# Patient Record
Sex: Male | Born: 1965 | Race: Black or African American | Hispanic: No | Marital: Single | Smoking: Never smoker
Health system: Southern US, Community
[De-identification: ages and names within clinical notes are randomized; demographics above are authoritative.]

## PROBLEM LIST (undated history)

## (undated) DIAGNOSIS — I10 Essential (primary) hypertension: Secondary | ICD-10-CM

## (undated) DIAGNOSIS — I509 Heart failure, unspecified: Secondary | ICD-10-CM

## (undated) DIAGNOSIS — E119 Type 2 diabetes mellitus without complications: Secondary | ICD-10-CM

## (undated) HISTORY — PX: NO PAST SURGERIES: SHX2092

---

## 2005-11-29 ENCOUNTER — Emergency Department: Payer: Self-pay | Admitting: Emergency Medicine

## 2005-12-02 ENCOUNTER — Emergency Department: Payer: Self-pay | Admitting: Emergency Medicine

## 2005-12-04 ENCOUNTER — Ambulatory Visit: Payer: Self-pay | Admitting: Emergency Medicine

## 2015-10-15 ENCOUNTER — Other Ambulatory Visit: Payer: Self-pay | Admitting: Internal Medicine

## 2015-10-15 DIAGNOSIS — I4891 Unspecified atrial fibrillation: Secondary | ICD-10-CM

## 2015-10-24 ENCOUNTER — Ambulatory Visit: Admission: RE | Admit: 2015-10-24 | Payer: Self-pay | Source: Ambulatory Visit

## 2016-09-07 ENCOUNTER — Inpatient Hospital Stay (HOSPITAL_COMMUNITY): Payer: BLUE CROSS/BLUE SHIELD

## 2016-09-07 ENCOUNTER — Inpatient Hospital Stay: Payer: BLUE CROSS/BLUE SHIELD

## 2016-09-07 ENCOUNTER — Inpatient Hospital Stay
Admission: EM | Admit: 2016-09-07 | Discharge: 2016-09-27 | DRG: 308 | Disposition: E | Payer: BLUE CROSS/BLUE SHIELD | Attending: Internal Medicine | Admitting: Internal Medicine

## 2016-09-07 ENCOUNTER — Encounter: Payer: Self-pay | Admitting: Intensive Care

## 2016-09-07 ENCOUNTER — Emergency Department: Payer: BLUE CROSS/BLUE SHIELD

## 2016-09-07 DIAGNOSIS — R402312 Coma scale, best motor response, none, at arrival to emergency department: Secondary | ICD-10-CM | POA: Diagnosis present

## 2016-09-07 DIAGNOSIS — I462 Cardiac arrest due to underlying cardiac condition: Secondary | ICD-10-CM | POA: Diagnosis present

## 2016-09-07 DIAGNOSIS — I469 Cardiac arrest, cause unspecified: Secondary | ICD-10-CM

## 2016-09-07 DIAGNOSIS — R402112 Coma scale, eyes open, never, at arrival to emergency department: Secondary | ICD-10-CM | POA: Diagnosis present

## 2016-09-07 DIAGNOSIS — I6782 Cerebral ischemia: Secondary | ICD-10-CM

## 2016-09-07 DIAGNOSIS — I472 Ventricular tachycardia: Secondary | ICD-10-CM | POA: Diagnosis not present

## 2016-09-07 DIAGNOSIS — G4730 Sleep apnea, unspecified: Secondary | ICD-10-CM | POA: Diagnosis present

## 2016-09-07 DIAGNOSIS — Z515 Encounter for palliative care: Secondary | ICD-10-CM

## 2016-09-07 DIAGNOSIS — J80 Acute respiratory distress syndrome: Secondary | ICD-10-CM | POA: Diagnosis present

## 2016-09-07 DIAGNOSIS — G931 Anoxic brain damage, not elsewhere classified: Secondary | ICD-10-CM

## 2016-09-07 DIAGNOSIS — E874 Mixed disorder of acid-base balance: Secondary | ICD-10-CM | POA: Diagnosis not present

## 2016-09-07 DIAGNOSIS — I4901 Ventricular fibrillation: Secondary | ICD-10-CM | POA: Diagnosis present

## 2016-09-07 DIAGNOSIS — I5023 Acute on chronic systolic (congestive) heart failure: Secondary | ICD-10-CM | POA: Diagnosis present

## 2016-09-07 DIAGNOSIS — Z9911 Dependence on respirator [ventilator] status: Secondary | ICD-10-CM | POA: Diagnosis not present

## 2016-09-07 DIAGNOSIS — I5084 End stage heart failure: Secondary | ICD-10-CM | POA: Diagnosis not present

## 2016-09-07 DIAGNOSIS — I482 Chronic atrial fibrillation: Secondary | ICD-10-CM | POA: Diagnosis present

## 2016-09-07 DIAGNOSIS — J984 Other disorders of lung: Secondary | ICD-10-CM

## 2016-09-07 DIAGNOSIS — G253 Myoclonus: Secondary | ICD-10-CM | POA: Diagnosis not present

## 2016-09-07 DIAGNOSIS — N179 Acute kidney failure, unspecified: Secondary | ICD-10-CM | POA: Diagnosis present

## 2016-09-07 DIAGNOSIS — R402212 Coma scale, best verbal response, none, at arrival to emergency department: Secondary | ICD-10-CM | POA: Diagnosis present

## 2016-09-07 DIAGNOSIS — R0600 Dyspnea, unspecified: Secondary | ICD-10-CM

## 2016-09-07 DIAGNOSIS — Z8249 Family history of ischemic heart disease and other diseases of the circulatory system: Secondary | ICD-10-CM

## 2016-09-07 DIAGNOSIS — D638 Anemia in other chronic diseases classified elsewhere: Secondary | ICD-10-CM | POA: Diagnosis not present

## 2016-09-07 DIAGNOSIS — J811 Chronic pulmonary edema: Secondary | ICD-10-CM | POA: Diagnosis not present

## 2016-09-07 DIAGNOSIS — J9611 Chronic respiratory failure with hypoxia: Secondary | ICD-10-CM

## 2016-09-07 DIAGNOSIS — Z66 Do not resuscitate: Secondary | ICD-10-CM | POA: Diagnosis not present

## 2016-09-07 DIAGNOSIS — J9600 Acute respiratory failure, unspecified whether with hypoxia or hypercapnia: Secondary | ICD-10-CM

## 2016-09-07 DIAGNOSIS — Z7984 Long term (current) use of oral hypoglycemic drugs: Secondary | ICD-10-CM

## 2016-09-07 DIAGNOSIS — D696 Thrombocytopenia, unspecified: Secondary | ICD-10-CM | POA: Diagnosis not present

## 2016-09-07 DIAGNOSIS — I130 Hypertensive heart and chronic kidney disease with heart failure and stage 1 through stage 4 chronic kidney disease, or unspecified chronic kidney disease: Secondary | ICD-10-CM | POA: Diagnosis present

## 2016-09-07 DIAGNOSIS — Z9181 History of falling: Secondary | ICD-10-CM

## 2016-09-07 DIAGNOSIS — J9601 Acute respiratory failure with hypoxia: Secondary | ICD-10-CM | POA: Diagnosis not present

## 2016-09-07 DIAGNOSIS — I429 Cardiomyopathy, unspecified: Secondary | ICD-10-CM | POA: Diagnosis present

## 2016-09-07 DIAGNOSIS — N183 Chronic kidney disease, stage 3 (moderate): Secondary | ICD-10-CM | POA: Diagnosis present

## 2016-09-07 DIAGNOSIS — G936 Cerebral edema: Secondary | ICD-10-CM | POA: Diagnosis present

## 2016-09-07 DIAGNOSIS — E782 Mixed hyperlipidemia: Secondary | ICD-10-CM | POA: Diagnosis present

## 2016-09-07 DIAGNOSIS — Z833 Family history of diabetes mellitus: Secondary | ICD-10-CM | POA: Diagnosis not present

## 2016-09-07 DIAGNOSIS — J9690 Respiratory failure, unspecified, unspecified whether with hypoxia or hypercapnia: Secondary | ICD-10-CM

## 2016-09-07 DIAGNOSIS — I248 Other forms of acute ischemic heart disease: Secondary | ICD-10-CM | POA: Diagnosis present

## 2016-09-07 DIAGNOSIS — Z6841 Body Mass Index (BMI) 40.0 and over, adult: Secondary | ICD-10-CM

## 2016-09-07 DIAGNOSIS — R570 Cardiogenic shock: Secondary | ICD-10-CM | POA: Diagnosis not present

## 2016-09-07 DIAGNOSIS — E1122 Type 2 diabetes mellitus with diabetic chronic kidney disease: Secondary | ICD-10-CM | POA: Diagnosis present

## 2016-09-07 DIAGNOSIS — R062 Wheezing: Secondary | ICD-10-CM

## 2016-09-07 DIAGNOSIS — R001 Bradycardia, unspecified: Secondary | ICD-10-CM | POA: Diagnosis not present

## 2016-09-07 DIAGNOSIS — Z452 Encounter for adjustment and management of vascular access device: Secondary | ICD-10-CM

## 2016-09-07 DIAGNOSIS — Z7901 Long term (current) use of anticoagulants: Secondary | ICD-10-CM

## 2016-09-07 DIAGNOSIS — K567 Ileus, unspecified: Secondary | ICD-10-CM | POA: Diagnosis not present

## 2016-09-07 DIAGNOSIS — R34 Anuria and oliguria: Secondary | ICD-10-CM | POA: Diagnosis present

## 2016-09-07 DIAGNOSIS — L8993 Pressure ulcer of unspecified site, stage 3: Secondary | ICD-10-CM | POA: Diagnosis present

## 2016-09-07 DIAGNOSIS — L8990 Pressure ulcer of unspecified site, unspecified stage: Secondary | ICD-10-CM | POA: Insufficient documentation

## 2016-09-07 DIAGNOSIS — J810 Acute pulmonary edema: Secondary | ICD-10-CM | POA: Diagnosis not present

## 2016-09-07 HISTORY — DX: Type 2 diabetes mellitus without complications: E11.9

## 2016-09-07 HISTORY — DX: Essential (primary) hypertension: I10

## 2016-09-07 HISTORY — DX: Heart failure, unspecified: I50.9

## 2016-09-07 LAB — BASIC METABOLIC PANEL
ANION GAP: 7 (ref 5–15)
Anion gap: 9 (ref 5–15)
Anion gap: 7 (ref 5–15)
Anion gap: 9 (ref 5–15)
Anion gap: 9 (ref 5–15)
BUN: 23 mg/dL — AB (ref 6–20)
BUN: 29 mg/dL — ABNORMAL HIGH (ref 6–20)
BUN: 31 mg/dL — AB (ref 6–20)
BUN: 32 mg/dL — AB (ref 6–20)
BUN: 33 mg/dL — AB (ref 6–20)
CALCIUM: 9.2 mg/dL (ref 8.9–10.3)
CALCIUM: 8.3 mg/dL — AB (ref 8.9–10.3)
CALCIUM: 8.4 mg/dL — AB (ref 8.9–10.3)
CALCIUM: 8.4 mg/dL — AB (ref 8.9–10.3)
CHLORIDE: 100 mmol/L — AB (ref 101–111)
CO2: 28 mmol/L (ref 22–32)
CO2: 29 mmol/L (ref 22–32)
CO2: 28 mmol/L (ref 22–32)
CO2: 28 mmol/L (ref 22–32)
CO2: 27 mmol/L (ref 22–32)
CREATININE: 2.12 mg/dL — AB (ref 0.61–1.24)
Calcium: 7.8 mg/dL — ABNORMAL LOW (ref 8.9–10.3)
Chloride: 100 mmol/L — ABNORMAL LOW (ref 101–111)
Chloride: 102 mmol/L (ref 101–111)
Chloride: 101 mmol/L (ref 101–111)
Chloride: 105 mmol/L (ref 101–111)
Creatinine, Ser: 1.68 mg/dL — ABNORMAL HIGH (ref 0.61–1.24)
Creatinine, Ser: 1.96 mg/dL — ABNORMAL HIGH (ref 0.61–1.24)
Creatinine, Ser: 2.07 mg/dL — ABNORMAL HIGH (ref 0.61–1.24)
Creatinine, Ser: 2.16 mg/dL — ABNORMAL HIGH (ref 0.61–1.24)
GFR calc Af Amer: 54 mL/min — ABNORMAL LOW (ref >60)
GFR calc Af Amer: 41 mL/min — ABNORMAL LOW (ref >60)
GFR calc Af Amer: 40 mL/min — ABNORMAL LOW (ref >60)
GFR calc non Af Amer: 39 mL/min — ABNORMAL LOW (ref >60)
GFR calc non Af Amer: 36 mL/min — ABNORMAL LOW (ref >60)
GFR calc non Af Amer: 35 mL/min — ABNORMAL LOW (ref >60)
GFR, EST AFRICAN AMERICAN: 45 mL/min — AB (ref >60)
GFR, EST AFRICAN AMERICAN: 42 mL/min — AB (ref >60)
GFR, EST NON AFRICAN AMERICAN: 47 mL/min — AB (ref >60)
GFR, EST NON AFRICAN AMERICAN: 34 mL/min — AB (ref >60)
GLUCOSE: 276 mg/dL — AB (ref 65–99)
GLUCOSE: 275 mg/dL — AB (ref 65–99)
GLUCOSE: 246 mg/dL — AB (ref 65–99)
GLUCOSE: 234 mg/dL — AB (ref 65–99)
Glucose, Bld: 158 mg/dL — ABNORMAL HIGH (ref 65–99)
POTASSIUM: 3.6 mmol/L (ref 3.5–5.1)
Potassium: 3.7 mmol/L (ref 3.5–5.1)
Potassium: 4 mmol/L (ref 3.5–5.1)
Potassium: 3.9 mmol/L (ref 3.5–5.1)
Potassium: 4 mmol/L (ref 3.5–5.1)
SODIUM: 139 mmol/L (ref 135–145)
Sodium: 137 mmol/L (ref 135–145)
Sodium: 138 mmol/L (ref 135–145)
Sodium: 138 mmol/L (ref 135–145)
Sodium: 137 mmol/L (ref 135–145)

## 2016-09-07 LAB — BLOOD GAS, ARTERIAL
ACID-BASE EXCESS: 1.2 mmol/L (ref 0.0–2.0)
Acid-Base Excess: 1.5 mmol/L (ref 0.0–2.0)
Bicarbonate: 28.7 mmol/L — ABNORMAL HIGH (ref 20.0–28.0)
Bicarbonate: 29.2 mmol/L — ABNORMAL HIGH (ref 20.0–28.0)
FIO2: 1
FIO2: 1
LHR: 16 {breaths}/min
O2 SAT: 88.5 %
O2 SAT: 96.9 %
PATIENT TEMPERATURE: 37
PATIENT TEMPERATURE: 36.7
PCO2 ART: 57 mmHg — AB (ref 32.0–48.0)
PCO2 ART: 58 mmHg — AB (ref 32.0–48.0)
PEEP: 5 cmH2O
PEEP: 10 cmH2O
PH ART: 7.31 — AB (ref 7.350–7.450)
PO2 ART: 61 mmHg — AB (ref 83.0–108.0)
PO2 ART: 95 mmHg (ref 83.0–108.0)
RATE: 16 resp/min
VT: 550 mL
VT: 500 mL
pH, Arterial: 7.31 — ABNORMAL LOW (ref 7.350–7.450)

## 2016-09-07 LAB — HEPATIC FUNCTION PANEL
ALT: 60 U/L (ref 17–63)
AST: 102 U/L — AB (ref 15–41)
Albumin: 3.1 g/dL — ABNORMAL LOW (ref 3.5–5.0)
Alkaline Phosphatase: 93 U/L (ref 38–126)
BILIRUBIN DIRECT: 0.4 mg/dL (ref 0.1–0.5)
BILIRUBIN TOTAL: 1.4 mg/dL — AB (ref 0.3–1.2)
Indirect Bilirubin: 1 mg/dL — ABNORMAL HIGH (ref 0.3–0.9)
Total Protein: 7 g/dL (ref 6.5–8.1)

## 2016-09-07 LAB — PROTIME-INR
INR: 1.33
INR: 1.36
INR: 1.39
PROTHROMBIN TIME: 17.2 s — AB (ref 11.4–15.2)
Prothrombin Time: 16.6 seconds — ABNORMAL HIGH (ref 11.4–15.2)
Prothrombin Time: 16.9 seconds — ABNORMAL HIGH (ref 11.4–15.2)

## 2016-09-07 LAB — CBC WITH DIFFERENTIAL/PLATELET
Basophils Absolute: 0.1 10*3/uL (ref 0–0.1)
Basophils Relative: 1 %
EOS PCT: 1 %
Eosinophils Absolute: 0.1 10*3/uL (ref 0–0.7)
HCT: 40.7 % (ref 40.0–52.0)
Hemoglobin: 13.2 g/dL (ref 13.0–18.0)
Lymphocytes Relative: 12 %
Lymphs Abs: 1.9 10*3/uL (ref 1.0–3.6)
MCH: 26.3 pg (ref 26.0–34.0)
MCHC: 32.5 g/dL (ref 32.0–36.0)
MCV: 80.8 fL (ref 80.0–100.0)
Monocytes Absolute: 0.7 10*3/uL (ref 0.2–1.0)
Monocytes Relative: 5 %
Neutro Abs: 12.6 10*3/uL — ABNORMAL HIGH (ref 1.4–6.5)
Neutrophils Relative %: 81 %
PLATELETS: 257 10*3/uL (ref 150–440)
RBC: 5.03 MIL/uL (ref 4.40–5.90)
RDW: 16.9 % — ABNORMAL HIGH (ref 11.5–14.5)
WBC: 15.4 10*3/uL — AB (ref 3.8–10.6)

## 2016-09-07 LAB — GLUCOSE, CAPILLARY
GLUCOSE-CAPILLARY: 230 mg/dL — AB (ref 65–99)
GLUCOSE-CAPILLARY: 222 mg/dL — AB (ref 65–99)
GLUCOSE-CAPILLARY: 173 mg/dL — AB (ref 65–99)
Glucose-Capillary: 306 mg/dL — ABNORMAL HIGH (ref 65–99)
Glucose-Capillary: 213 mg/dL — ABNORMAL HIGH (ref 65–99)
Glucose-Capillary: 227 mg/dL — ABNORMAL HIGH (ref 65–99)
Glucose-Capillary: 189 mg/dL — ABNORMAL HIGH (ref 65–99)

## 2016-09-07 LAB — BRAIN NATRIURETIC PEPTIDE: B NATRIURETIC PEPTIDE 5: 508 pg/mL — AB (ref 0.0–100.0)

## 2016-09-07 LAB — URINE DRUG SCREEN, QUALITATIVE (ARMC ONLY)
Amphetamines, Ur Screen: NOT DETECTED
BARBITURATES, UR SCREEN: NOT DETECTED
BENZODIAZEPINE, UR SCRN: POSITIVE — AB
CANNABINOID 50 NG, UR ~~LOC~~: NOT DETECTED
COCAINE METABOLITE, UR ~~LOC~~: NOT DETECTED
MDMA (Ecstasy)Ur Screen: NOT DETECTED
METHADONE SCREEN, URINE: NOT DETECTED
OPIATE, UR SCREEN: NOT DETECTED
PHENCYCLIDINE (PCP) UR S: NOT DETECTED
Tricyclic, Ur Screen: NOT DETECTED

## 2016-09-07 LAB — MAGNESIUM: MAGNESIUM: 2.2 mg/dL (ref 1.7–2.4)

## 2016-09-07 LAB — HEPARIN LEVEL (UNFRACTIONATED): Heparin Unfractionated: 2.42 IU/mL — ABNORMAL HIGH (ref 0.30–0.70)

## 2016-09-07 LAB — LACTIC ACID, PLASMA
Lactic Acid, Venous: 3 mmol/L (ref 0.5–1.9)
Lactic Acid, Venous: 1.5 mmol/L (ref 0.5–1.9)

## 2016-09-07 LAB — ACETAMINOPHEN LEVEL

## 2016-09-07 LAB — APTT
APTT: 85 s — AB (ref 24–36)
aPTT: 29 seconds (ref 24–36)

## 2016-09-07 LAB — SALICYLATE LEVEL

## 2016-09-07 LAB — TROPONIN I
TROPONIN I: 0.47 ng/mL — AB (ref <0.03)
Troponin I: 0.08 ng/mL (ref <0.03)
Troponin I: 0.44 ng/mL (ref <0.03)

## 2016-09-07 LAB — ETHANOL: Alcohol, Ethyl (B): <5 mg/dL (ref <5)

## 2016-09-07 LAB — MRSA PCR SCREENING: MRSA by PCR: NEGATIVE

## 2016-09-07 MED ORDER — CISATRACURIUM BESYLATE (PF) 200 MG/20ML IV SOLN
1.0000 ug/kg/min | INTRAVENOUS | Status: DC
  Administered 2016-09-07 – 2016-09-08 (×3): 1 ug/kg/min via INTRAVENOUS
  Filled 2016-09-07 (×3): qty 20

## 2016-09-07 MED ORDER — INSULIN ASPART 100 UNIT/ML ~~LOC~~ SOLN: 0.0000 [IU] | Freq: Every day | SUBCUTANEOUS | Status: DC

## 2016-09-07 MED ORDER — NOREPINEPHRINE BITARTRATE 1 MG/ML IV SOLN
0.0000 ug/min | INTRAVENOUS | Status: DC
  Administered 2016-09-08: 5 ug/min via INTRAVENOUS
  Administered 2016-09-09: 4 ug/min via INTRAVENOUS
  Administered 2016-09-10: 30 ug/min via INTRAVENOUS
  Administered 2016-09-10: 5 ug/min via INTRAVENOUS
  Administered 2016-09-10: 20 ug/min via INTRAVENOUS
  Administered 2016-09-11: 36 ug/min via INTRAVENOUS
  Filled 2016-09-07 (×6): qty 4

## 2016-09-07 MED ORDER — SODIUM CHLORIDE 0.9% FLUSH
10.0000 mL | Freq: Two times a day (BID) | INTRAVENOUS | Status: DC
  Administered 2016-09-07: 30 mL
  Administered 2016-09-07: 10 mL
  Administered 2016-09-08: 30 mL
  Administered 2016-09-08 – 2016-09-10 (×3): 10 mL
  Administered 2016-09-11: 30 mL
  Administered 2016-09-12 – 2016-09-13 (×2): 10 mL
  Administered 2016-09-13: 20 mL
  Administered 2016-09-14 – 2016-09-18 (×7): 10 mL

## 2016-09-07 MED ORDER — CISATRACURIUM BOLUS VIA INFUSION
0.0500 mg/kg | INTRAVENOUS | Status: DC | PRN
  Filled 2016-09-07: qty 11

## 2016-09-07 MED ORDER — EPINEPHRINE PF 1 MG/10ML IJ SOSY
PREFILLED_SYRINGE | INTRAMUSCULAR | Status: AC | PRN
  Administered 2016-09-07: 1 via INTRAVENOUS

## 2016-09-07 MED ORDER — FENTANYL CITRATE (PF) 100 MCG/2ML IJ SOLN
100.0000 ug | Freq: Once | INTRAMUSCULAR | Status: AC
  Administered 2016-09-07: 100 ug via INTRAVENOUS
  Filled 2016-09-07: qty 2

## 2016-09-07 MED ORDER — DEXTROSE 5 % IV SOLN
1.0000 g | Freq: Once | INTRAVENOUS | Status: AC
  Administered 2016-09-07: 1 g via INTRAVENOUS
  Filled 2016-09-07: qty 10

## 2016-09-07 MED ORDER — ACETAMINOPHEN 325 MG PO TABS
650.0000 mg | ORAL_TABLET | Freq: Four times a day (QID) | ORAL | Status: DC | PRN
  Filled 2016-09-07: qty 2

## 2016-09-07 MED ORDER — CISATRACURIUM BOLUS VIA INFUSION
0.1000 mg/kg | Freq: Once | INTRAVENOUS | Status: AC
  Administered 2016-09-07: 20.6 mg via INTRAVENOUS
  Filled 2016-09-07: qty 21

## 2016-09-07 MED ORDER — FENTANYL BOLUS VIA INFUSION
50.0000 ug | INTRAVENOUS | Status: DC | PRN
  Administered 2016-09-07 – 2016-09-12 (×9): 50 ug via INTRAVENOUS
  Administered 2016-09-13: 65 ug via INTRAVENOUS
  Administered 2016-09-15 – 2016-09-19 (×11): 50 ug via INTRAVENOUS
  Filled 2016-09-07: qty 50

## 2016-09-07 MED ORDER — ASPIRIN 300 MG RE SUPP
300.0000 mg | RECTAL | Status: AC
  Administered 2016-09-07: 300 mg via RECTAL
  Filled 2016-09-07: qty 1

## 2016-09-07 MED ORDER — ACETAMINOPHEN 650 MG RE SUPP: 650.0000 mg | Freq: Four times a day (QID) | RECTAL | Status: DC | PRN

## 2016-09-07 MED ORDER — ARTIFICIAL TEARS OPHTHALMIC OINT
1.0000 "application " | TOPICAL_OINTMENT | Freq: Three times a day (TID) | OPHTHALMIC | Status: DC
  Filled 2016-09-07: qty 3.5

## 2016-09-07 MED ORDER — FENTANYL 2500MCG IN NS 250ML (10MCG/ML) PREMIX INFUSION
100.0000 ug/h | INTRAVENOUS | Status: DC
  Administered 2016-09-07: 100 ug/h via INTRAVENOUS
  Administered 2016-09-08: 125 ug/h via INTRAVENOUS
  Administered 2016-09-09: 150 ug/h via INTRAVENOUS
  Administered 2016-09-09: 125 ug/h via INTRAVENOUS
  Administered 2016-09-10 – 2016-09-11 (×3): 200 ug/h via INTRAVENOUS
  Administered 2016-09-12: 150 ug/h via INTRAVENOUS
  Administered 2016-09-14: 75 ug/h via INTRAVENOUS
  Administered 2016-09-15: 250 ug/h via INTRAVENOUS
  Administered 2016-09-15: 200 ug/h via INTRAVENOUS
  Administered 2016-09-16 (×2): 300 ug/h via INTRAVENOUS
  Administered 2016-09-16 – 2016-09-19 (×10): 400 ug/h via INTRAVENOUS
  Filled 2016-09-07 (×27): qty 250

## 2016-09-07 MED ORDER — POLYVINYL ALCOHOL 1.4 % OP SOLN
1.0000 [drp] | Freq: Three times a day (TID) | OPHTHALMIC | Status: DC | PRN
  Administered 2016-09-13 – 2016-09-18 (×7): 1 [drp] via OPHTHALMIC
  Administered 2016-09-18: 2 [drp] via OPHTHALMIC
  Administered 2016-09-18 – 2016-09-19 (×2): 1 [drp] via OPHTHALMIC
  Filled 2016-09-07: qty 15

## 2016-09-07 MED ORDER — MIDAZOLAM HCL 2 MG/2ML IJ SOLN
2.0000 mg | Freq: Once | INTRAMUSCULAR | Status: AC
  Administered 2016-09-07: 2 mg via INTRAVENOUS
  Filled 2016-09-07: qty 2

## 2016-09-07 MED ORDER — ROCURONIUM BROMIDE 50 MG/5ML IV SOLN
INTRAVENOUS | Status: AC | PRN
  Administered 2016-09-07: 100 mg via INTRAVENOUS

## 2016-09-07 MED ORDER — ETOMIDATE 2 MG/ML IV SOLN
INTRAVENOUS | Status: AC | PRN
  Administered 2016-09-07: 40 mg via INTRAVENOUS

## 2016-09-07 MED ORDER — ORAL CARE MOUTH RINSE
15.0000 mL | OROMUCOSAL | Status: DC
  Administered 2016-09-07 – 2016-09-19 (×119): 15 mL via OROMUCOSAL

## 2016-09-07 MED ORDER — INSULIN ASPART 100 UNIT/ML ~~LOC~~ SOLN
0.0000 [IU] | Freq: Three times a day (TID) | SUBCUTANEOUS | Status: DC
  Administered 2016-09-07: 3 [IU] via SUBCUTANEOUS
  Filled 2016-09-07: qty 3

## 2016-09-07 MED ORDER — CHLORHEXIDINE GLUCONATE 0.12% ORAL RINSE (MEDLINE KIT)
15.0000 mL | Freq: Two times a day (BID) | OROMUCOSAL | Status: DC
  Administered 2016-09-07 – 2016-09-19 (×24): 15 mL via OROMUCOSAL

## 2016-09-07 MED ORDER — DEXTROSE 5 % IV SOLN
0.5000 ug/min | INTRAVENOUS | Status: DC
  Filled 2016-09-07: qty 4

## 2016-09-07 MED ORDER — MIDAZOLAM BOLUS VIA INFUSION
2.0000 mg | INTRAVENOUS | Status: DC | PRN
  Administered 2016-09-09 (×2): 2 mg via INTRAVENOUS
  Filled 2016-09-07: qty 2

## 2016-09-07 MED ORDER — FUROSEMIDE 10 MG/ML IJ SOLN
80.0000 mg | Freq: Once | INTRAMUSCULAR | Status: AC
Start: 2016-09-07 — End: 2016-09-07
  Administered 2016-09-07: 80 mg via INTRAVENOUS
  Filled 2016-09-07: qty 8

## 2016-09-07 MED ORDER — PANTOPRAZOLE SODIUM 40 MG PO TBEC: 40.0000 mg | DELAYED_RELEASE_TABLET | Freq: Every day | ORAL | Status: DC

## 2016-09-07 MED ORDER — SODIUM CHLORIDE 0.9 % IV BOLUS (SEPSIS)
INTRAVENOUS | Status: AC
  Administered 2016-09-07: 1000 mL via INTRAVENOUS
  Filled 2016-09-07: qty 2000

## 2016-09-07 MED ORDER — SODIUM CHLORIDE 0.9 % IV SOLN
INTRAVENOUS | Status: DC
  Administered 2016-09-07 – 2016-09-18 (×5): via INTRAVENOUS

## 2016-09-07 MED ORDER — MAGNESIUM SULFATE 2 GM/50ML IV SOLN
2.0000 g | Freq: Once | INTRAVENOUS | Status: AC
  Administered 2016-09-07: 2 g via INTRAVENOUS
  Filled 2016-09-07: qty 50

## 2016-09-07 MED ORDER — PANTOPRAZOLE SODIUM 40 MG IV SOLR
40.0000 mg | INTRAVENOUS | Status: DC
  Administered 2016-09-07 – 2016-09-08 (×2): 40 mg via INTRAVENOUS
  Filled 2016-09-07 (×3): qty 40

## 2016-09-07 MED ORDER — BUDESONIDE 0.5 MG/2ML IN SUSP
0.5000 mg | Freq: Two times a day (BID) | RESPIRATORY_TRACT | Status: DC
  Administered 2016-09-07 – 2016-09-16 (×18): 0.5 mg via RESPIRATORY_TRACT
  Filled 2016-09-07 (×20): qty 2

## 2016-09-07 MED ORDER — MIDAZOLAM HCL 5 MG/ML IJ SOLN
2.0000 mg/h | INTRAMUSCULAR | Status: DC
  Administered 2016-09-07 – 2016-09-08 (×3): 2 mg/h via INTRAVENOUS
  Administered 2016-09-09 – 2016-09-10 (×2): 4 mg/h via INTRAVENOUS
  Filled 2016-09-07 (×5): qty 10

## 2016-09-07 MED ORDER — INSULIN ASPART 100 UNIT/ML ~~LOC~~ SOLN
2.0000 [IU] | SUBCUTANEOUS | Status: DC
  Administered 2016-09-07: 6 [IU] via SUBCUTANEOUS
  Filled 2016-09-07: qty 6

## 2016-09-07 MED ORDER — CALCIUM CHLORIDE 10 % IV SOLN
INTRAVENOUS | Status: AC | PRN
  Administered 2016-09-07: 1 g via INTRAVENOUS

## 2016-09-07 MED ORDER — SODIUM BICARBONATE 8.4 % IV SOLN
INTRAVENOUS | Status: AC | PRN
  Administered 2016-09-07: 50 meq via INTRAVENOUS

## 2016-09-07 MED ORDER — IOPAMIDOL (ISOVUE-370) INJECTION 76%
75.0000 mL | Freq: Once | INTRAVENOUS | Status: AC | PRN
  Administered 2016-09-07: 75 mL via INTRAVENOUS

## 2016-09-07 MED ORDER — IPRATROPIUM-ALBUTEROL 0.5-2.5 (3) MG/3ML IN SOLN
3.0000 mL | Freq: Four times a day (QID) | RESPIRATORY_TRACT | Status: DC
  Administered 2016-09-07 – 2016-09-19 (×49): 3 mL via RESPIRATORY_TRACT
  Filled 2016-09-07 (×48): qty 3

## 2016-09-07 MED ORDER — HEPARIN (PORCINE) IN NACL 100-0.45 UNIT/ML-% IJ SOLN
1800.0000 [IU]/h | INTRAMUSCULAR | Status: DC
  Administered 2016-09-07 – 2016-09-08 (×2): 1800 [IU]/h via INTRAVENOUS
  Filled 2016-09-07 (×2): qty 250

## 2016-09-07 MED ORDER — SODIUM CHLORIDE 0.9 % IV BOLUS (SEPSIS)
1000.0000 mL | Freq: Once | INTRAVENOUS | Status: AC
  Administered 2016-09-07: 1000 mL via INTRAVENOUS

## 2016-09-07 MED ORDER — SODIUM CHLORIDE 0.9 % IV SOLN
INTRAVENOUS | Status: DC
  Administered 2016-09-07: 1.7 [IU]/h via INTRAVENOUS
  Filled 2016-09-07: qty 1

## 2016-09-07 MED ORDER — SODIUM CHLORIDE 0.9% FLUSH: 10.0000 mL | INTRAVENOUS | Status: DC | PRN

## 2016-09-07 MED FILL — Medication: Qty: 1 | Status: AC

## 2016-09-07 NOTE — Procedures (Signed)
Central Venous Catheter Placement: Indication: Patient receiving vesicant or irritant drug.; Patient receiving intravenous therapy for longer than 5 days.; Patient has limited or no vascular access.   Consent: emergent.   Risks and benefits explained in detail including risk of infection, bleeding, respiratory failure and death..   Hand washing performed prior to starting the procedure.   Procedure: An active timeout was performed and correct patient, name, & ID confirmed.  After explaining risk and benefits, patient was positioned correctly for central venous access. Patient was prepped using strict sterile technique including chlorohexadine preps, sterile drape, sterile gown and sterile gloves.  The area was prepped, draped and anesthetized in the usual sterile manner. Patient comfort was obtained.  A triple lumen catheter was placed in right Internal Jugular Vein There was good blood return, catheter caps were placed on lumens, catheter flushed easily, the line was secured and a sterile dressing and BIO-PATCH applied.   Ultrasound was used to visualize vasculature and guidance of needle.   Number of Attempts: 2 Complications:none Estimated Blood Loss: none Chest Radiograph indicated and ordered.  Operator: Wells Guileseep Kaley Jutras, M.D.   Wells Guileseep Navraj Dreibelbis, M.D.  Pulmonary & Critical Care Medicine Intensive Care Unit

## 2016-09-07 NOTE — Consult Note (Signed)
Reason for Consult: cardiac arrest  Referring Physician: Dr. Renae Gloss  CC: cardiac arrest   HPI: Isaiah [name] is an 51 y.o. male  with a known history of Hypertension diabetes and congestive heart failure. Pt is s/p fall and cardiac arrest with CPR x  3 rounds.  There was suspected shaking episode prior to arrest.  CTH no acute abnormalities.      Past Medical History:  Diagnosis Date  . CHF (congestive heart failure) (HCC)   . Diabetes mellitus without complication (HCC)   . Hypertension     Past Surgical History:  Procedure Laterality Date  . NO PAST SURGERIES      Family History  Problem Relation Age of Onset  . Hypertension Mother   . Hypertension Father   . Diabetes Father     Social History:  reports that he has never smoked. He has never used smokeless tobacco. He reports that he does not drink alcohol or use drugs.  Not on File  Medications: I have reviewed the patient's current medications.  ROS: Unable to obtain   Physical Examination: Blood pressure 103/76, pulse 75, temperature 97.2 F (36.2 C), temperature source Core (Comment), resp. rate 20, height 6' (1.829 m), weight (!) 205.6 kg (453 lb 4.3 oz), SpO2 100 %.  Pt is sedated, paralyzed and not following commands.     Laboratory Studies:   Basic Metabolic Panel:  Recent Labs Lab September 22, 2016 0750 22-Sep-2016 1208  NA 137 138  K 3.7 4.0  CL 100* 102  CO2 28 29  GLUCOSE 276* 275*  BUN 23* 29*  CREATININE 1.68* 1.96*  CALCIUM 9.2 8.3*    Liver Function Tests:  Recent Labs Lab 09-22-2016 0750  AST 102*  ALT 60  ALKPHOS 93  BILITOT 1.4*  PROT 7.0  ALBUMIN 3.1*   No results for input(s): LIPASE, AMYLASE in the last 168 hours. No results for input(s): AMMONIA in the last 168 hours.  CBC:  Recent Labs Lab 2016-09-22 0750  WBC 15.4*  NEUTROABS 12.6*  HGB 13.2  HCT 40.7  MCV 80.8  PLT 257    Cardiac Enzymes:  Recent Labs Lab 2016-09-22 0750 2016-09-22 1208  TROPONINI 0.08* 0.44*     BNP: Invalid input(s): POCBNP  CBG:  Recent Labs Lab 22-Sep-2016 0932 September 22, 2016 1208  GLUCAP 306* 230*    Microbiology: No results found for this or any previous visit.  Coagulation Studies:  Recent Labs  09/22/16 0750 22-Sep-2016 1208  LABPROT 16.6* 16.9*  INR 1.33 1.36    Urinalysis: No results for input(s): COLORURINE, LABSPEC, PHURINE, GLUCOSEU, HGBUR, BILIRUBINUR, KETONESUR, PROTEINUR, UROBILINOGEN, NITRITE, LEUKOCYTESUR in the last 168 hours.  Invalid input(s): APPERANCEUR  Lipid Panel:  No results found for: CHOL, TRIG, HDL, CHOLHDL, VLDL, LDLCALC  HgbA1C: No results found for: HGBA1C  Urine Drug Screen:  No results found for: LABOPIA, COCAINSCRNUR, LABBENZ, AMPHETMU, THCU, LABBARB  Alcohol Level:  Recent Labs Lab 09-22-2016 0750  ETH <5    Other results:   Imaging: Ct Head Wo Contrast  Result Date: 22-Sep-2016 CLINICAL DATA:  Cardiac arrest. EXAM: CT HEAD WITHOUT CONTRAST TECHNIQUE: Contiguous axial images were obtained from the base of the skull through the vertex without intravenous contrast. COMPARISON:  None. FINDINGS: Brain: No evidence of acute infarction, hemorrhage, hydrocephalus, extra-axial collection or mass lesion/mass effect. Vascular: No hyperdense vessel or unexpected calcification. Skull: Normal. Negative for fracture or focal lesion. Sinuses/Orbits: Scattered areas of mucosal disease in the maxillary sinuses. Minimal sinus disease in the sphenoid  sinuses. Other: None. IMPRESSION: No acute intracranial abnormality. Paranasal sinus disease. Electronically Signed   By: Richarda OverlieAdam  Henn M.D.   On: 09/17/2016 09:43   Ct Angio Chest Pe W/cm &/or Wo Cm  Result Date: 08/30/2016 CLINICAL DATA:  Cardiac arrest. EXAM: CT ANGIOGRAPHY CHEST WITH CONTRAST TECHNIQUE: Multidetector CT imaging of the chest was performed using the standard protocol during bolus administration of intravenous contrast. Multiplanar CT image reconstructions and MIPs were obtained to  evaluate the vascular anatomy. CONTRAST:  75 cc of Isovue 370 COMPARISON:  None FINDINGS: Cardiovascular: The heart size is enlarged. There does not appear to be evidence of right heart strain. No definite evidence for embolus within the main pulmonary artery or the lobar branches. Examination is nondiagnostic for the segmental, and subsegmental branches. Mediastinum/Nodes: ET tube tip is above the carina. The nasogastric tube with tip in the stomach. Study is very difficult to determine whether not there are any enlarged mediastinal or hilar lymph nodes. No axillary or supraclavicular adenopathy. Lungs/Pleura: No pleural effusion. Bilateral upper and lower lobe posterior predominant atelectasis and subpleural consolidation. Upper Abdomen: No acute abnormality. Musculoskeletal: There is a fracture through the mid body of the sternum, image 54 of series 9. Fracture involving the posterior aspect of the left third rib noted. Review of the MIP images confirms the above findings. IMPRESSION: 1. Markedly diminished exam detail secondary to patient's body habitus. Patient reportedly weighs greater than 500 pounds. 2. No evidence for pulmonary embolus in the main pulmonary artery or the lobar branches. The exam is nondiagnostic for segmental and subsegmental branches. 3. Sternal fracture.  Left posterior third rib fracture. 4. Bilateral posterior atelectasis and subpleural consolidation. Electronically Signed   By: Signa Kellaylor  Stroud M.D.   On: 09/25/2016 09:57   Dg Chest Port 1 View  Result Date: 09/16/2016 CLINICAL DATA:  Line placement after cardiac arrest today. EXAM: PORTABLE CHEST 1 VIEW COMPARISON:  CT chest and chest radiograph 09/15/2016. FINDINGS: Endotracheal tube terminates 4.6 cm above the carina. Left IJ central line tip is poorly visualized due to body habitus. It is followed into the left brachiocephalic region but is difficult to follow any further. Nasogastric tube is followed into the distal esophagus  but again, is difficult to follow further due to body habitus. Defibrillator pad is seen over the left chest. Heart is enlarged. Lungs are somewhat low in volume with mild interstitial prominence and indistinctness. No definite pleural fluid. No pneumothorax. IMPRESSION: 1. Body habitus is limiting and degrades image quality. 2. Left IJ central line tip is followed into the left brachiocephalic vein but is difficult to see beyond that due to body habitus. 3. Suspect mild pulmonary edema. Electronically Signed   By: Leanna BattlesMelinda  Blietz M.D.   On: 09/13/2016 11:31   Dg Chest Port 1 View  Result Date: 09/26/2016 CLINICAL DATA:  Hypoxia.  Status post cardiac arrest EXAM: PORTABLE CHEST 1 VIEW COMPARISON:  None. FINDINGS: Endotracheal tube tip is 3.5 cm above the carina. Nasogastric to tip and side port are below the diaphragm. No pneumothorax. There is cardiomegaly with pulmonary venous hypertension. There is slight interstitial edema. There is no appreciable airspace consolidation. No adenopathy evident. No bone lesions. IMPRESSION: Tube positions as described without pneumothorax. Evidence a degree of congestive heart failure without consolidation or volume loss. Electronically Signed   By: Bretta BangWilliam  Woodruff III M.D.   On: 09/12/2016 08:33     Assessment/Plan:  51 y.o. male  with a known history of Hypertension diabetes and congestive heart  failure. Pt is s/p fall and cardiac arrest with CPR x 3 rounds.  There was suspected shaking episode prior to arrest.  CTH no acute abnormalities.    - Acute hypoxic respiratory failure leading to cardiac arrest.   - EEG today - On hypothermia protocol, further imaging once off hypothermia protocol.  - unable to give prognosis at this time given the sedation and lack of neurological examination  - will follow.  September 15, 2016, 1:13 PM

## 2016-09-07 NOTE — ED Provider Notes (Signed)
[facility] Emergency Department Provider Note  ____________________________________________   First MD Initiated Contact with Patient September 27, 2016 [phone]     (approximate)  I have reviewed the triage vital signs and the nursing notes.   HISTORY  Chief Complaint Cardiac Arrest  Level V exemption history Limited by the patient's cardiac arrest  HPI Isaiah [name] is a 51 y.o. male who comes to the emergency department via EMS after sustaining an out of hospital cardiac arrest. According to EMS the patient was at home when his brother heard a side and went to respond to the patient. The brother initiated CPR and called EMS. When EMS arrived the patient was initially found to be in asystole and 1 round of CPR was performed with epinephrine. He was subsequently in ventricular fibrillation and he was defibrillated but went into PDA. Throughout the course of the 45 minutes that EMS was on scene he received a total of 3 doses of epinephrine, 1 amp of sodium bicarbonate, 1 dose of Narcan, and he had a King LT placed. Decision was made to transport to the hospital by Dr. Zenda Alpers. Prior to transfer to the hospital the patient was placed on external cardiac pacing and he began to over breathe the backing and he had a pulse. On arrival to the emergency department he lost pulses. On chart review he has a history of atrial fibrillation, hypertension, diabetes mellitus.   Past Medical History:  Diagnosis Date  . CHF (congestive heart failure) (HCC)   . Diabetes mellitus without complication (HCC)   . Hypertension     Patient Active Problem List   Diagnosis Date Noted  . Cardiac arrest (HCC) 09-27-16    Past Surgical History:  Procedure Laterality Date  . NO PAST SURGERIES      Prior to Admission medications   Medication Sig Start Date End Date Taking? Authorizing Provider  apixaban (ELIQUIS) 5 MG TABS tablet Take 5 mg by mouth 2 (two) times daily.   Yes [provider]  atorvastatin (LIPITOR) 20 MG tablet Take 20 mg by mouth daily.   Yes [provider]  benazepril (LOTENSIN) 40 MG tablet Take 40 mg by mouth daily.   Yes [provider]  carvedilol (COREG) 25 MG tablet Take 25 mg by mouth 2 (two) times daily with a meal.   Yes [provider]  diltiazem (DILACOR XR) 240 MG 24 hr capsule Take 240 mg by mouth.   Yes [provider]  furosemide (LASIX) 80 MG tablet Take 80 mg by mouth daily as needed.   Yes [provider]  glimepiride (AMARYL) 2 MG tablet Take 2 mg by mouth daily with breakfast.   Yes [provider]  magnesium oxide (MAG-OX) 400 MG tablet Take 400 mg by mouth.   Yes [provider]  METFORMIN HCL ER PO Take 1,000 mg by mouth 2 (two) times daily.   Yes [provider]  metolazone (ZAROXOLYN) 2.5 MG tablet Take 2.5 mg by mouth every other day.   Yes [provider]    Allergies Patient has no allergy information on record.  Family History  Problem Relation Age of Onset  . Hypertension Mother   . Hypertension Father   . Diabetes Father     Social History Social History  Substance Use Topics  . Smoking status: Never Smoker  . Smokeless tobacco: Never Used  . Alcohol use No    Review of Systems Level V exemption history Limited by  cardiac arrest  ____________________________________________   PHYSICAL EXAM:  VITAL SIGNS: ED Triage Vitals  Enc Vitals Group     BP      Pulse      Resp      Temp      Temp src      SpO2      Weight      Height      Head Circumference      Peak Flow      Pain Score      Pain Loc      Pain Edu?      Excl. in GC?     Constitutional: Comatose morbidly obese Eyes: Pupils are 1 mm and fixed Head: Atraumatic. Nose: No congestion/rhinnorhea. Mouth/Throat: No trismus Neck: No stridor.   Cardiovascular: Asystole Respiratory: Coarse breath sounds when being bagged Gastrointestinal: Morbidly  obese Musculoskeletal: Legs equal in size   Neurologic:  Comatose not moving Skin:  Skin is warm, dry and intact. No rash noted. Psychiatric: Comatose    ____________________________________________   DIFFERENTIAL  STEMI, ventricular tachycardia, hyperkalemia, pulmonary embolus and, intracerebral hemorrhage ____________________________________________   LABS (all labs ordered are listed, but only abnormal results are displayed)  Labs Reviewed  ACETAMINOPHEN LEVEL - Abnormal; Notable for the following:       Result Value   Acetaminophen (Tylenol), Serum <10 (*)    All other components within normal limits  BASIC METABOLIC PANEL - Abnormal; Notable for the following:    Chloride 100 (*)    Glucose, Bld 276 (*)    BUN 23 (*)    Creatinine, Ser 1.68 (*)    GFR calc non Af Amer 47 (*)    GFR calc Af Amer 54 (*)    All other components within normal limits  HEPATIC FUNCTION PANEL - Abnormal; Notable for the following:    Albumin 3.1 (*)    AST 102 (*)    Total Bilirubin 1.4 (*)    Indirect Bilirubin 1.0 (*)    All other components within normal limits  BRAIN NATRIURETIC PEPTIDE - Abnormal; Notable for the following:    B Natriuretic Peptide 508.0 (*)    All other components within normal limits  TROPONIN I - Abnormal; Notable for the following:    Troponin I 0.08 (*)    All other components within normal limits  CBC WITH DIFFERENTIAL/PLATELET - Abnormal; Notable for the following:    WBC 15.4 (*)    RDW 16.9 (*)    Neutro Abs 12.6 (*)    All other components within normal limits  PROTIME-INR - Abnormal; Notable for the following:    Prothrombin Time 16.6 (*)    All other components within normal limits  BLOOD GAS, ARTERIAL - Abnormal; Notable for the following:    pH, Arterial 7.31 (*)    pCO2 arterial 57 (*)    pO2, Arterial 61 (*)    Bicarbonate 28.7 (*)    All other components within normal limits  CULTURE, BLOOD (ROUTINE X 2)  CULTURE, BLOOD (ROUTINE X 2)    ETHANOL  SALICYLATE LEVEL  LACTIC ACID, PLASMA  LACTIC ACID, PLASMA  HEMOGLOBIN A1C  HIV ANTIBODY (ROUTINE TESTING)  TROPONIN I  TROPONIN I  URINE DRUG SCREEN, QUALITATIVE ([facility] ONLY)    Slightly elevated troponin to be expected after CPR normal potassium __________________________________________  EKG  ED ECG REPORT I, Merrily Brittle, the attending physician, personally viewed and interpreted this ECG.  Date: 09/09/2016 Rate: 76 Rhythm: Atrial fibrillation  QRS Axis: normal Intervals: normal ST/T Wave abnormalities: Possible ST depression V2 Conduction Disturbances: none Narrative Interpretation: Low voltage possible mild ST depression in V2 or could be artifact multiple PVCs abnormal EKG but no signs of STEMI  ____________________________________________  RADIOLOGY  Chest x-ray nonspecific shows pulmonary congestion and ET tube in appropriate position ____________________________________________   PROCEDURES  Procedure(s) performed: yes  INTUBATION Performed by: Merrily Brittle  Required items: required blood products, implants, devices, and special equipment available Patient identity confirmed: provided demographic data and hospital-assigned identification number Time out: Immediately prior to procedure a "time out" was called to verify the correct patient, procedure, equipment, support staff and site/side marked as required.  Indications: Cardiac arrest   Intubation method: Glidescope Laryngoscopy   Preoxygenation: BVM and high flow nasal cannula   Sedatives: Etomidate Paralytic: Rocuronium   Tube Size: 7.5 cuffed  Post-procedure assessment: chest rise and ETCO2 monitor Breath sounds: equal and absent over the epigastrium Tube secured with: ETT holder Chest x-ray interpreted by radiologist and me.  Chest x-ray findings: endotracheal tube in appropriate position  Patient tolerated the procedure well with no immediate  complications.    Cardiopulmonary Resuscitation (CPR) Procedure Note Directed/Performed by: Merrily Brittle I personally directed ancillary staff and/or performed CPR in an effort to regain return of spontaneous circulation and to maintain cardiac, neuro and systemic perfusion.    Procedures  Critical Care performed: yes  CRITICAL CARE Performed by: Merrily Brittle   Total critical care time: 40 minutes  Critical care time was exclusive of separately billable procedures and treating other patients.  Critical care was necessary to treat or prevent imminent or life-threatening deterioration.  Critical care was time spent personally by me on the following activities: development of treatment plan with patient and/or surrogate as well as nursing, discussions with consultants, evaluation of patient's response to treatment, examination of patient, obtaining history from patient or surrogate, ordering and performing treatments and interventions, ordering and review of laboratory studies, ordering and review of radiographic studies, pulse oximetry and re-evaluation of patient's condition.   Observation: no ____________________________________________   INITIAL IMPRESSION / ASSESSMENT AND PLAN / ED COURSE  Pertinent labs & imaging results that were available during my care of the patient were reviewed by me and considered in my medical decision making (see chart for details).  The patient arrived via EMS with a Brooke Dare LT in place as well as a peripheral IV and a left shin intraosseous line. He was initially being paced and only although on transition to our gurney and pacing was lost and the patient lost pulses. We performed a total of 3 rounds of CPR with one dose of epinephrine 1 of calcium and 1 of sodium bicarbonate and the patient regained pulses. Pacing was not continued. After he regained pulses the patient opened his eyes and began to bite on the ET tube. He required etomidate and  rocuronium to facilitate endotracheal intubation. Shortly after intubation his blood pressure was normal, however he began to become somewhat hypotensive so epinephrine drip was begun. Differential is broad but given the patient's good neurologic signs and ventricular fibrillation code ice was called. I discussed the case with the intensivist and the hospitalist both graciously agreed to admit the patient. On the way up to the ICU he will get a CT of his head and a CT angiomata look for intracerebral hemorrhage versus pulmonary embolism. Family updated.      ____________________________________________   FINAL CLINICAL IMPRESSION(S) / ED DIAGNOSES  Final diagnoses:  Cardiac arrest [facility](HCC)      NEW MEDICATIONS STARTED DURING THIS VISIT:  New Prescriptions   No medications on file     Note:  This document was prepared using Dragon voice recognition software and may include unintentional dictation errors.     Merrily Brittleifenbark, Shadawn Hanaway, MD 02-Feb-2017 [phone]

## 2016-09-07 NOTE — Progress Notes (Signed)
RN made Isaiah Harmanana, NP aware that at around 1900 patient had a 10 beat run of vtach and asked should a magnesium level be checked.  RN also made NP aware that patient is having 2-3 beats of PVCs occasionally.  NP acknowledged and stated she would order a serum mag level.

## 2016-09-07 NOTE — ED Triage Notes (Signed)
Upon EMS arrival to scene patient was asystole and patient was given 2 Epis. EMS then shocked patient back into asystole after going into V fib. A total of 3 Epis and Bi-carb given by EMS. Upon arrival to ER patient was being paced at a rate of 70. Faint respirations. HX diabetes and HTN. Blood sugar by EMS 245

## 2016-09-07 NOTE — Progress Notes (Signed)
While rounding, [place] made initial visit to room ICU-1. Pt was intubated and unable to respond. [place] met with family in the corridor to ICU and again the Pt room. Brother was emotional seeing his older brother incapacitated. Brother found Pt. Mother and Brother requested prayer, which was provided. [place] will inform on-call [facility] for follow up.    09/04/2016 1500  Clinical Encounter Type  Visited With Patient;Patient and family together;Health care provider  Visit Type Initial;Spiritual support;Critical Care  Referral From Nurse  Consult/Referral To Chaplain  Spiritual Encounters  Spiritual Needs Prayer;Emotional;Grief support

## 2016-09-07 NOTE — ED Notes (Signed)
Called code ice per Dr. Lamont Snowballifenbark   [phone]

## 2016-09-07 NOTE — Progress Notes (Signed)
Pt transported via transport vent to CT and then to CCU 1.  Pt tolerated transport well.

## 2016-09-07 NOTE — Progress Notes (Deleted)
Patient was restless throughout morning but was able to follow commands and nod/gesture appropriately. Wake up assessment performed but patient had runs of Vtach roughly every 1 to 2 minutes and still continued on levophed so per Dr. Nicholos Johnsamachandran patient did not get SBT today. Sedation restarted and propofol started. Patient calmer and less restless on propofol.

## 2016-09-07 NOTE — Progress Notes (Signed)
Spoke with Dr. Nicholos Johnsamachandran about patient not achieving target temperature (not even past 36 C yet). MD acknowledged and no new orders. Patient already has ice packs on in addition to artic sun pads, RN will place more ice packs and towels soaked in ice water. CVP 22 and MD did not want to give cold fluids. Team will continue to monitor.

## 2016-09-07 NOTE — ED Notes (Signed)
Per MD, pt is PEA, CPR continued

## 2016-09-07 NOTE — H&P (Signed)
[facility] [place] [facility]   PATIENT NAME: Isaiah [name]    MR#:  [id number]  DATE OF BIRTH:  [birth year]  DATE OF ADMISSION:  01-31-17  PRIMARY CARE PHYSICIAN: Dr Mickey Farberavid Thies  REQUESTING/REFERRING PHYSICIAN: Dr Merrily BrittleNeil Rifenbark  CHIEF COMPLAINT:   Chief Complaint  Patient presents with  . Cardiac Arrest    HISTORY OF PRESENT ILLNESS:  Isaiah [name]  is a 51 y.o. male with a known history of Hypertension diabetes and congestive heart failure. Family heard a fall and came over to evaluate him. He was foaming at the mouth and they saw a few jerking moments but settled down quickly. He was currently breathing at that time but did not feel a pulse. They started chest compressions for 3-4 minutes and started breathing again. EMS took over and brought to the ER. EMS perform CPR and obtained IO access. In the ER he received 3 rounds of CPR. Initially was asystole then ventricular fibrillation then PEA. Now with return of spontaneous circulation and in atrial fibrillation. Of note, the patient does take Eliquis for atrial fibrillation. Chest x-ray, CT scan of the head and CT angiogram of the chest ordered by ER physician  PAST MEDICAL HISTORY:   Past Medical History:  Diagnosis Date  . CHF (congestive heart failure) (HCC)   . Diabetes mellitus without complication (HCC)   . Hypertension     PAST SURGICAL HISTORY:   Past Surgical History:  Procedure Laterality Date  . NO PAST SURGERIES      SOCIAL HISTORY:   Social History  Substance Use Topics  . Smoking status: Never Smoker  . Smokeless tobacco: Never Used  . Alcohol use No    FAMILY HISTORY:   Family History  Problem Relation Age of Onset  . Hypertension Mother   . Hypertension Father   . Diabetes Father     DRUG ALLERGIES:  Allergies not on file  REVIEW OF SYSTEMS:  Unable to perform at this time  MEDICATIONS AT HOME:   Prior to Admission medications   Medication Sig Start  Date End Date Taking? Authorizing Provider  apixaban (ELIQUIS) 5 MG TABS tablet Take 5 mg by mouth 2 (two) times daily.   Yes [provider]  atorvastatin (LIPITOR) 20 MG tablet Take 20 mg by mouth daily.   Yes [provider]  benazepril (LOTENSIN) 40 MG tablet Take 40 mg by mouth daily.   Yes [provider]  carvedilol (COREG) 25 MG tablet Take 25 mg by mouth 2 (two) times daily with a meal.   Yes [provider]  diltiazem (DILACOR XR) 240 MG 24 hr capsule Take 240 mg by mouth.   Yes [provider]  furosemide (LASIX) 80 MG tablet Take 80 mg by mouth daily as needed.   Yes [provider]  glimepiride (AMARYL) 2 MG tablet Take 2 mg by mouth daily with breakfast.   Yes [provider]  magnesium oxide (MAG-OX) 400 MG tablet Take 400 mg by mouth.   Yes [provider]  METFORMIN HCL ER PO Take 1,000 mg by mouth 2 (two) times daily.   Yes [provider]  metolazone (ZAROXOLYN) 2.5 MG tablet Take 2.5 mg by mouth every other day.   Yes [provider]      VITAL SIGNS:  Blood pressure 110/89, pulse 84, resp. rate 17, SpO2 93 %.  PHYSICAL EXAMINATION:  GENERAL:  51 y.o.-year-old patient lying in the bed with no acute distress.  Patient is intubated. EYES: Pupils pinpoint. Unable to test extraocular muscles. HEENT: Head atraumatic, normocephalic.  nasopharynx clear. Tympanic membrane no erythema. Unable to look into the mouth at this time NECK:  Supple, positive jugular venous distention. No thyroid enlargement, no tenderness.  LUNGS: Decreased breath sounds bilaterally, diffuse expiratory wheezing. No rales,rhonchi or crepitation. No use of accessory muscles of respiration.  CARDIOVASCULAR: S1, S2 normal. No murmurs, rubs, or gallops.  ABDOMEN: Soft, nontender, nondistended. Bowel sounds present. No organomegaly or mass.  EXTREMITIES: 2+ edema, no cyanosis, or clubbing.  NEUROLOGIC: Currently  unresponsive to painful stimuli.  PSYCHIATRIC: Unable to test at this time SKIN: No rash, lesion, or ulcer.   LABORATORY PANEL:   CBC  Recent Labs Lab 08/30/2016 0750  WBC 15.4*  HGB 13.2  HCT 40.7  PLT 257   ------------------------------------------------------------------------------------------------------------------  Chemistries   Recent Labs Lab 09/06/2016 0750  NA 137  K 3.7  CL 100*  CO2 28  GLUCOSE 276*  BUN 23*  CREATININE 1.68*  CALCIUM 9.2  AST 102*  ALT 60  ALKPHOS 93  BILITOT 1.4*   ------------------------------------------------------------------------------------------------------------------  Cardiac Enzymes  Recent Labs Lab 09/01/2016 0750  TROPONINI 0.08*   ------------------------------------------------------------------------------------------------------------------  RADIOLOGY:  Testing ordered  EKG:   Atrial fibrillation low voltage Q waves septally  IMPRESSION AND PLAN:   1. Cardiac arrest. Currently unclear etiology. ER physician spoke with ICU specialist. CT scan of the head and chest ordered by ER physician. Also chest x-ray ordered. Since the patient is on Eliquis as outpatient and had a fall need to rule out brain bleed prior to starting heparin drip. Give 1 dose of Lasix. Serial cardiac enzymes. Consider rectal aspirin if CT scan of the head is negative for brain bleed. Obtain critical care consultation. Obtain cardiology consultation. EEG today. Need to watch the patient closely over the next 24-72 hours to see how he responds to treatments. Overall prognosis is poor. I likelihood for cardiac arrest again. Chemistry currently pending. 2. Type 2 diabetes mellitus. Put on sliding scale at this point. 3. Essential hypertension hold antihypertensive medication at this point 4. History of congestive heart failure. Echocardiogram to determine EF. 5. Acute encephalopathy secondary to cardiac arrest 6. Urine toxicology ordered 7.  Morbid obesity, likely sleep apnea. Weight loss will be needed  All the records are reviewed and case discussed with ED provider. Management plans discussed with the family and they are in agreement.  CODE STATUS: Full code  TOTAL TIME TAKING CARE OF THIS PATIENT: 60 minutes, critical care time.    Alford Highland M.D on 09/18/2016 at 8:34 AM  Between 7am to 6pm - Pager - [phone]  After 6pm call admission pager [phone]  [facility] Office  [phone]  CC: Primary care physician; Mickey Farber

## 2016-09-07 NOTE — Consult Note (Signed)
[facility] Critical Care Medicine Consultation    SYNOPSIS   51 yo male with DM, afib,  cardiac arrest at home with Vfib arrest. Resuscitated, now unresponsive, cooling initiated.   ASSESSMENT/PLAN    PULMONARY A:Acute hypoxic respiratory failure with cardiac arrest. I personally reviewed chest imaging, reduced sensitivity due to body habitus, there is cardiomegaly with reduced lung volumes from obesity. Possible pulmonary edema.   Blood gas 7.31/57/61/28.7.  P:   Continue vent support, peep increased due to persistent hypoxia.   VENTILATOR SETTINGS: Vent Mode: PRVC FiO2 (%):  [100 %] 100 % Set Rate:  [16 bmp] 16 bmp Vt Set:  [550 mL] 550 mL PEEP:  [5 cmH20-10 cmH20] 10 cmH20  CARDIOVASCULAR A: Cardiac and vfib arrest. ECG tracings reviewed, no evidence of acute MI, cardiology consulted.  P:  Continue heparin, supportive measures.   HEMODYNAMICS:    RENAL A:  AKI P:   Likely secondary to above, continue to monitor.   INTAKE / OUTPUT: No intake or output data in the 24 hours ending 09/20/2016 1115  GASTROINTESTINAL A:  -- HEMATOLOGIC A:  --  INFECTIOUS A:  -- P:    Micro/culture results:  BCx2 pending.  UC -- Sputum pending  Antibiotics:   ENDOCRINE A:  DM  P:   SSI  NEUROLOGIC A:  Acute anoxic encephalopathy.  CT head negative.  P:   --Initiated hypothermia protocol.  --D/w family, prognosis guarded.    MAJOR EVENTS/TEST RESULTS: 06/11:Cardiac arrest, admitted, intubated   Best Practices  DVT Prophylaxis: IV heparin GI Prophylaxis: PPI  ---------------------------------------  ---------------------------------------   Name: Isaiah [name] MRN: [id number] DOB: [birth year]    ADMISSION DATE:  09/12/2016 CONSULTATION DATE:  09/17/2016  REFERRING MD :  Dr. Hilton Sinclair for cardiac arrest.   CHIEF COMPLAINT:  Cardiac arrest.    HISTORY OF PRESENT ILLNESS:    The patient is currently unresponsive, therefore, all history was obtained  from the chart, staff, parents and brother. The patient is active, works as a Optician, dispensing, and has had no recent complaints, was feeling in his usual state. Patient's brother was sleeping when he heard a sound like somebody falling. The brother found him unresponsive, initiated CPR, called EMS. He was found to be in asystole by EMS, initiated one round of CPR, was found to be in ventricular fibrillation and was defibrillated to PEA. He was apparently worked on for a total of approximately 45 minutes on scene. He was then brought into the ED.   After arrival in the ED, he lost his pulse again and he underwent another 3 rounds of CPR. He was initially in V. fib, and went to PEA arrest. After achieving [facility], the patient was then noted to be biting on the tube. He underwent a CT of the head and chest, CT of the chest was personally reviewed by me, this was a suboptimal study, did not appear to show any central pulmonary embolism. However, there was significant cardiomegaly and reduced lung volumes with pulmonary edema. Subsequently, upon arrival in the ICU. The patient was noted to be unresponsive, with occasional jerking movements. The patient was started on hypothermia protocol.   PAST MEDICAL HISTORY :  Past Medical History:  Diagnosis Date  . CHF (congestive heart failure) (HCC)   . Diabetes mellitus without complication (HCC)   . Hypertension    Past Surgical History:  Procedure Laterality Date  . NO PAST SURGERIES     Prior to Admission medications   Medication Sig Start Date  End Date Taking? Authorizing Provider  apixaban (ELIQUIS) 5 MG TABS tablet Take 5 mg by mouth 2 (two) times daily.   Yes [provider]  atorvastatin (LIPITOR) 20 MG tablet Take 20 mg by mouth daily.   Yes [provider]  benazepril (LOTENSIN) 40 MG tablet Take 40 mg by mouth daily.   Yes [provider]  carvedilol (COREG) 25 MG tablet Take 25 mg by mouth 2 (two) times daily with a meal.   Yes  [provider]  diltiazem (DILACOR XR) 240 MG 24 hr capsule Take 240 mg by mouth.   Yes [provider]  furosemide (LASIX) 80 MG tablet Take 80 mg by mouth daily as needed.   Yes [provider]  glimepiride (AMARYL) 2 MG tablet Take 2 mg by mouth daily with breakfast.   Yes [provider]  magnesium oxide (MAG-OX) 400 MG tablet Take 400 mg by mouth.   Yes [provider]  METFORMIN HCL ER PO Take 1,000 mg by mouth 2 (two) times daily.   Yes [provider]  metolazone (ZAROXOLYN) 2.5 MG tablet Take 2.5 mg by mouth every other day.   Yes [provider]   Not on File  FAMILY HISTORY:  Family History  Problem Relation Age of Onset  . Hypertension Mother   . Hypertension Father   . Diabetes Father    SOCIAL HISTORY:  reports that he has never smoked. He has never used smokeless tobacco. He reports that he does not drink alcohol or use drugs.  REVIEW OF SYSTEMS:   History could not be obtained due to unresponsiveness.   VITAL SIGNS: Pulse Rate:  [84-116] 84 (06/11 0757) Resp:  [17-22] 17 (06/11 0757) BP: (110)/(89) 110/89 (06/11 0744) SpO2:  [93 %-100 %] 93 % (06/11 0757) FiO2 (%):  [100 %] 100 % (06/11 0945) Weight:  [453 lb 4.3 oz (205.6 kg)] 453 lb 4.3 oz (205.6 kg) (06/11 0954) HEMODYNAMICS:   VENTILATOR SETTINGS: Vent Mode: PRVC FiO2 (%):  [100 %] 100 % Set Rate:  [16 bmp] 16 bmp Vt Set:  [550 mL] 550 mL PEEP:  [5 cmH20-10 cmH20] 10 cmH20 INTAKE / OUTPUT: No intake or output data in the 24 hours ending 09/25/2016 1115  Physical Examination:   VS: BP 110/89   Pulse 84   Resp 17   Ht 6' (1.829 m)   Wt (!) 453 lb 4.3 oz (205.6 kg)   SpO2 93%   BMI 61.47 kg/m   General Appearance: No distress  Neuro:without focal findings, mental status reduced, occasional jerking movements.  HEENT: PERRLA, EOM intact, no ptosis, no other lesions noticed;  Pulmonary: decreased air entry bilaterally.    CardiovascularNormal S1,S2.  No m/r/g.    Abdomen: Benign, Soft, non-tender, No masses, hepatosplenomegaly, No lymphadenopathy Renal:  No costovertebral tenderness  GU:  Not performed at this time. Endoc: No evident thyromegaly, no signs of acromegaly. Skin:   warm, no rashes, no ecchymosis  Extremities: normal, no cyanosis, clubbing, no edema, warm with normal capillary refill.    LABS: Reviewed   LABORATORY PANEL:   CBC  Recent Labs Lab 09/04/2016 0750  WBC 15.4*  HGB 13.2  HCT 40.7  PLT 257    Chemistries   Recent Labs Lab 09/22/2016 0750  NA 137  K 3.7  CL 100*  CO2 28  GLUCOSE 276*  BUN 23*  CREATININE 1.68*  CALCIUM 9.2  AST 102*  ALT 60  ALKPHOS 93  BILITOT 1.4*  Recent Labs Lab 02-02-17 0932  GLUCAP 306*    Recent Labs Lab 02-02-17 0753  PHART 7.31*  PCO2ART 57*  PO2ART 61*    Recent Labs Lab 02-02-17 0750  AST 102*  ALT 60  ALKPHOS 93  BILITOT 1.4*  ALBUMIN 3.1*    Cardiac Enzymes  Recent Labs Lab 02-02-17 0750  TROPONINI 0.08*    RADIOLOGY:  Ct Head Wo Contrast  Result Date: Jun 26, 2016 CLINICAL DATA:  Cardiac arrest. EXAM: CT HEAD WITHOUT CONTRAST TECHNIQUE: Contiguous axial images were obtained from the base of the skull through the vertex without intravenous contrast. COMPARISON:  None. FINDINGS: Brain: No evidence of acute infarction, hemorrhage, hydrocephalus, extra-axial collection or mass lesion/mass effect. Vascular: No hyperdense vessel or unexpected calcification. Skull: Normal. Negative for fracture or focal lesion. Sinuses/Orbits: Scattered areas of mucosal disease in the maxillary sinuses. Minimal sinus disease in the sphenoid sinuses. Other: None. IMPRESSION: No acute intracranial abnormality. Paranasal sinus disease. Electronically Signed   By: Richarda OverlieAdam  Henn M.D.   On: 0Mar 30, 2018 09:43   Ct Angio Chest Pe W/cm &/or Wo Cm  Result Date: Jun 26, 2016 CLINICAL DATA:  Cardiac arrest. EXAM: CT ANGIOGRAPHY CHEST WITH  CONTRAST TECHNIQUE: Multidetector CT imaging of the chest was performed using the standard protocol during bolus administration of intravenous contrast. Multiplanar CT image reconstructions and MIPs were obtained to evaluate the vascular anatomy. CONTRAST:  75 cc of Isovue 370 COMPARISON:  None FINDINGS: Cardiovascular: The heart size is enlarged. There does not appear to be evidence of right heart strain. No definite evidence for embolus within the main pulmonary artery or the lobar branches. Examination is nondiagnostic for the segmental, and subsegmental branches. Mediastinum/Nodes: ET tube tip is above the carina. The nasogastric tube with tip in the stomach. Study is very difficult to determine whether not there are any enlarged mediastinal or hilar lymph nodes. No axillary or supraclavicular adenopathy. Lungs/Pleura: No pleural effusion. Bilateral upper and lower lobe posterior predominant atelectasis and subpleural consolidation. Upper Abdomen: No acute abnormality. Musculoskeletal: There is a fracture through the mid body of the sternum, image 54 of series 9. Fracture involving the posterior aspect of the left third rib noted. Review of the MIP images confirms the above findings. IMPRESSION: 1. Markedly diminished exam detail secondary to patient's body habitus. Patient reportedly weighs greater than 500 pounds. 2. No evidence for pulmonary embolus in the main pulmonary artery or the lobar branches. The exam is nondiagnostic for segmental and subsegmental branches. 3. Sternal fracture.  Left posterior third rib fracture. 4. Bilateral posterior atelectasis and subpleural consolidation. Electronically Signed   By: Signa Kellaylor  Stroud M.D.   On: 0Mar 30, 2018 09:57   Dg Chest Port 1 View  Result Date: Jun 26, 2016 CLINICAL DATA:  Hypoxia.  Status post cardiac arrest EXAM: PORTABLE CHEST 1 VIEW COMPARISON:  None. FINDINGS: Endotracheal tube tip is 3.5 cm above the carina. Nasogastric to tip and side port are below the  diaphragm. No pneumothorax. There is cardiomegaly with pulmonary venous hypertension. There is slight interstitial edema. There is no appreciable airspace consolidation. No adenopathy evident. No bone lesions. IMPRESSION: Tube positions as described without pneumothorax. Evidence a degree of congestive heart failure without consolidation or volume loss. Electronically Signed   By: Bretta BangWilliam  Woodruff III M.D.   On: 0Mar 30, 2018 08:33       --Wells Guileseep Abundio Teuscher, MD.  Board Certified in Internal Medicine, Pulmonary Medicine, Critical Care Medicine, and Sleep Medicine.  ICU Pager [phone] [facility] Office Number: [phone]  Santiago GladDavid Kasa,  M.D.  Billy Fischer, M.D   09/01/2016, 11:15 AM

## 2016-09-07 NOTE — ED Notes (Signed)
Verbal orders for [place], hypothermia protocol by MD Renae GlossWieting

## 2016-09-07 NOTE — Consult Note (Signed)
[facility] Cardiology Consultation Note  Patient ID: Isaiah [name], MRN: [id number], DOB/AGE: 1966/03/05 51 y.o. Admit date: 09/09/2016   Date of Consult: 09/22/2016 Primary Physician: Patient, No Pcp Per Primary Cardiologist: [place]  Chief Complaint:  Chief Complaint  Patient presents with  . Cardiac Arrest   Reason for Consult: acute arrest  HPI: 51 y.o. male with known chronic nonvalvular atrial fibrillation essential hypertension mixed hyperlipidemia sleep apnea and significant obesity previously on appropriate treatment including anticoagulation high intensity cholesterol therapy and heart rate regulating medication management as well as CPAP machine and doing relatively well. Apparently he was in his bedroom when the family member heard him fall and he was in full arrest. At that time the patient was apparently not breathing and required intubation and further treatment. When arrival to the emergency room the patient has had stabilization hemodynamically with an EKG showing chronic and atrial fibrillation with slightly more rapid heart rate likely due to hypoxia. Additionally EKG twice has shown atrial fibrillation with controlled ventricular rate and nonspecific ST changes unchanged from before and no current evidence of ST elevation myocardial infarction. Troponin has been 0.08 and 0.4 more consistent with hypoxia and demand ischemia rather than acute coronary syndrome and/or ST elevation myocardial infarction. Currently has had some congestive heart failure with pulmonary edema by x-ray and is responding stool diuresis and oxygenation with a BNP of 500. It is unclear whether this is acute on chronic diastolic dysfunction heart failure due to hypoxia or systolic heart failure. Currently there is no indication that this is a primarily a acute coronary syndrome requiring further intervention at this time  Past Medical History:  Diagnosis Date  . CHF (congestive heart failure) (HCC)    . Diabetes mellitus without complication (HCC)   . Hypertension       Surgical History:  Past Surgical History:  Procedure Laterality Date  . NO PAST SURGERIES       Home Meds: Prior to Admission medications   Medication Sig Start Date End Date Taking? Authorizing Provider  apixaban (ELIQUIS) 5 MG TABS tablet Take 5 mg by mouth 2 (two) times daily.   Yes [provider]  atorvastatin (LIPITOR) 20 MG tablet Take 20 mg by mouth daily.   Yes [provider]  benazepril (LOTENSIN) 40 MG tablet Take 40 mg by mouth daily.   Yes [provider]  carvedilol (COREG) 25 MG tablet Take 25 mg by mouth 2 (two) times daily with a meal.   Yes [provider]  diltiazem (DILACOR XR) 240 MG 24 hr capsule Take 240 mg by mouth.   Yes [provider]  furosemide (LASIX) 80 MG tablet Take 80 mg by mouth daily as needed.   Yes [provider]  glimepiride (AMARYL) 2 MG tablet Take 2 mg by mouth daily with breakfast.   Yes [provider]  magnesium oxide (MAG-OX) 400 MG tablet Take 400 mg by mouth.   Yes [provider]  METFORMIN HCL ER PO Take 1,000 mg by mouth 2 (two) times daily.   Yes [provider]  metolazone (ZAROXOLYN) 2.5 MG tablet Take 2.5 mg by mouth every other day.   Yes [provider]    Inpatient Medications:  . budesonide (PULMICORT) nebulizer solution  0.5 mg Nebulization BID  . insulin aspart  0-5 Units Subcutaneous QHS  . insulin aspart  0-9 Units Subcutaneous TID WC  . ipratropium-albuterol  3 mL Nebulization Q6H  . pantoprazole  40 mg  Oral Q1200   . sodium chloride 10 mL/hr at 09/22/2016 1135  . cisatracurium (NIMBEX) infusion 1 mcg/kg/min (2016-09-22 1130)  . epinephrine    . fentaNYL infusion INTRAVENOUS 100 mcg/hr (2016/09/22 1027)  . midazolam (VERSED) infusion 2 mg/hr (09/22/2016 1129)  . norepinephrine (LEVOPHED) Adult infusion      Allergies: Not on File  Social History   Social  History  . Marital status: Single    Spouse name: N/A  . Number of children: N/A  . Years of education: N/A   Occupational History  . Not on file.   Social History Main Topics  . Smoking status: Never Smoker  . Smokeless tobacco: Never Used  . Alcohol use No  . Drug use: No  . Sexual activity: Not on file   Other Topics Concern  . Not on file   Social History Narrative  . No narrative on file     Family History  Problem Relation Age of Onset  . Hypertension Mother   . Hypertension Father   . Diabetes Father      Review of Systems  Cannot assess due to intubation and sedation  Labs:  Recent Labs  2016-09-22 0750 2016-09-22 1208  TROPONINI 0.08* 0.44*   Lab Results  Component Value Date   WBC 15.4 (H) Sep 22, 2016   HGB 13.2 09/22/16   HCT 40.7 22-Sep-2016   MCV 80.8 22-Sep-2016   PLT 257 2016-09-22    Recent Labs Lab Sep 22, 2016 0750 September 22, 2016 1208  NA 137 138  K 3.7 4.0  CL 100* 102  CO2 28 29  BUN 23* 29*  CREATININE 1.68* 1.96*  CALCIUM 9.2 8.3*  PROT 7.0  --   BILITOT 1.4*  --   ALKPHOS 93  --   ALT 60  --   AST 102*  --   GLUCOSE 276* 275*   No results found for: CHOL, HDL, LDLCALC, TRIG No results found for: DDIMER  Radiology/Studies:  Ct Head Wo Contrast  Result Date: 09/22/2016 CLINICAL DATA:  Cardiac arrest. EXAM: CT HEAD WITHOUT CONTRAST TECHNIQUE: Contiguous axial images were obtained from the base of the skull through the vertex without intravenous contrast. COMPARISON:  None. FINDINGS: Brain: No evidence of acute infarction, hemorrhage, hydrocephalus, extra-axial collection or mass lesion/mass effect. Vascular: No hyperdense vessel or unexpected calcification. Skull: Normal. Negative for fracture or focal lesion. Sinuses/Orbits: Scattered areas of mucosal disease in the maxillary sinuses. Minimal sinus disease in the sphenoid sinuses. Other: None. IMPRESSION: No acute intracranial abnormality. Paranasal sinus disease. Electronically Signed    By: Richarda Overlie M.D.   On: 09-22-2016 09:43   Ct Angio Chest Pe W/cm &/or Wo Cm  Result Date: September 22, 2016 CLINICAL DATA:  Cardiac arrest. EXAM: CT ANGIOGRAPHY CHEST WITH CONTRAST TECHNIQUE: Multidetector CT imaging of the chest was performed using the standard protocol during bolus administration of intravenous contrast. Multiplanar CT image reconstructions and MIPs were obtained to evaluate the vascular anatomy. CONTRAST:  75 cc of Isovue 370 COMPARISON:  None FINDINGS: Cardiovascular: The heart size is enlarged. There does not appear to be evidence of right heart strain. No definite evidence for embolus within the main pulmonary artery or the lobar branches. Examination is nondiagnostic for the segmental, and subsegmental branches. Mediastinum/Nodes: ET tube tip is above the carina. The nasogastric tube with tip in the stomach. Study is very difficult to determine whether not there are any enlarged mediastinal or hilar lymph nodes. No axillary or supraclavicular adenopathy. Lungs/Pleura: No pleural effusion. Bilateral upper and lower  lobe posterior predominant atelectasis and subpleural consolidation. Upper Abdomen: No acute abnormality. Musculoskeletal: There is a fracture through the mid body of the sternum, image 54 of series 9. Fracture involving the posterior aspect of the left third rib noted. Review of the MIP images confirms the above findings. IMPRESSION: 1. Markedly diminished exam detail secondary to patient's body habitus. Patient reportedly weighs greater than 500 pounds. 2. No evidence for pulmonary embolus in the main pulmonary artery or the lobar branches. The exam is nondiagnostic for segmental and subsegmental branches. 3. Sternal fracture.  Left posterior third rib fracture. 4. Bilateral posterior atelectasis and subpleural consolidation. Electronically Signed   By: Signa Kellaylor  Stroud M.D.   On: 09/20/2016 09:57   Dg Chest Port 1 View  Result Date: 09/18/2016 CLINICAL DATA:  Line placement  after cardiac arrest today. EXAM: PORTABLE CHEST 1 VIEW COMPARISON:  CT chest and chest radiograph 09/20/2016. FINDINGS: Endotracheal tube terminates 4.6 cm above the carina. Left IJ central line tip is poorly visualized due to body habitus. It is followed into the left brachiocephalic region but is difficult to follow any further. Nasogastric tube is followed into the distal esophagus but again, is difficult to follow further due to body habitus. Defibrillator pad is seen over the left chest. Heart is enlarged. Lungs are somewhat low in volume with mild interstitial prominence and indistinctness. No definite pleural fluid. No pneumothorax. IMPRESSION: 1. Body habitus is limiting and degrades image quality. 2. Left IJ central line tip is followed into the left brachiocephalic vein but is difficult to see beyond that due to body habitus. 3. Suspect mild pulmonary edema. Electronically Signed   By: Leanna BattlesMelinda  Blietz M.D.   On: 09/18/2016 11:31   Dg Chest Port 1 View  Result Date: 09/09/2016 CLINICAL DATA:  Hypoxia.  Status post cardiac arrest EXAM: PORTABLE CHEST 1 VIEW COMPARISON:  None. FINDINGS: Endotracheal tube tip is 3.5 cm above the carina. Nasogastric to tip and side port are below the diaphragm. No pneumothorax. There is cardiomegaly with pulmonary venous hypertension. There is slight interstitial edema. There is no appreciable airspace consolidation. No adenopathy evident. No bone lesions. IMPRESSION: Tube positions as described without pneumothorax. Evidence a degree of congestive heart failure without consolidation or volume loss. Electronically Signed   By: Bretta BangWilliam  Woodruff III M.D.   On: 09/25/2016 08:33    EKG: Atrial fibrillation with controlled ventricular rate and nonspecific ST and T-wave changes  Weights: Filed Weights   09/24/2016 0954  Weight: (!) 205.6 kg (453 lb 4.3 oz)     Physical Exam: Blood pressure 103/76, pulse 75, temperature (!) 91.4 F (33 C), resp. rate 20, height 6'  (1.829 m), weight (!) 205.6 kg (453 lb 4.3 oz), SpO2 100 %. Body mass index is 61.47 kg/m. General: Well developed, well nourished, in no acute distressBut intubated. Head eyes ears nose throat: Normocephalic, atraumatic, sclera non-icteric, no xanthomas, nares are without discharge. No apparent thyromegaly and/or mass  Lungs: Normal respiratory effort.  no wheezes, no rales, no rhonchi.  Heart: Irregular with normal S1 S2. no murmur gallop, no rub, PMI is normal size and placement, carotid upstroke normal without bruit, jugular venous pressure is normal Abdomen: Soft,   non-distended with normoactive bowel sounds. No hepatomegaly. No rebound/guarding. No obvious abdominal masses. Abdominal aorta is normal size without bruit Extremities: 1+ edema. no cyanosis, no clubbing, no ulcers  Peripheral : 2+ bilateral upper extremity pulses, 2+ bilateral femoral pulses, 2+ bilateral dorsal pedal pulse Neuro: Not Alert and oriented.  No facial asymmetry. No focal deficit. Does not Moves all extremities spontaneously. Musculoskeletal: Normal muscle tone without kyphosis Psych:  Does not Responds to questions appropriately with a normal affect.    Assessment: 51 year old male with chronic nonvalvular atrial fibrillation essential hypertension diabetes with complication sleep apnea with an acute arrest and acute congestive heart failure with hypoxia and no current evidence of acute coronary syndrome although some demand ischemia causing elevated troponin  Plan: 1. Continue supportive care of arrest with intubation oxygenation and cooling due to patient currently hemodynamically stable 2. Follow closely for heart rate control of atrial fibrillation and addition of medications as necessary 3. Abstain from anticoagulation at this point other than heparin due to inability to take oral medication management and concerns of bleeding risks if further intervention necessary 4. Echocardiogram for LV systolic  dysfunction valvular heart disease and cause of congestive heart failure 5. Diuresis for pulmonary edema and hypoxia 6. Diagnostic testing depending on above  Signed, Lamar Blinks M.D. [facility] Cardiology 09/08/2016, 1:34 PM

## 2016-09-07 NOTE — Progress Notes (Signed)
On call CH. [facility] Pt. was in CT before being assigned ICU 3. Pt. Is unconscious. CH. Provide emotional support and prayer with Fm. Dr. Algis DownsAdvised Fm. Pt. Was in bad position and not sure if he would recover. FM. Advised that monitoring would take place over next 72 hours to see if Pt. Improves or regains consciousness.

## 2016-09-07 NOTE — Plan of Care (Signed)
Problem: Fluid Volume: Goal: Ability to maintain a balanced intake and output will improve Outcome: Progressing Urine output increased during shift from 0 mL to roughly 20-30 mL/hr, creatinine and BUN trending up in labs prior to 18:00  Problem: Cardiac: Goal: Ability to achieve and maintain adequate cardiopulmonary perfusion will improve Outcome: Progressing Patient did not require vasopressors during shift. Heart rate in afib, heparin infusion.   Problem: Neurologic: Goal: Promote progressive neurologic recovery Outcome: Progressing Patient finally reached target per Donavan FoilArctic Sun at 18:08. Patient had difficult time reaching target temperature (two extra arctic sun pads used, ice packs in multiple skin folds and towels soaked in ice water used to finally reach target temperature, MD did not want to use cold saline due to fluid volume status).  Problem: Skin Integrity: Goal: Risk for impaired skin integrity will be minimized. Outcome: Progressing Changed ice pack placement and ice water soaked towels frequently to promote skin integrity and promote cooling of patient.

## 2016-09-07 NOTE — Progress Notes (Signed)
Chaplain received page to visit with family of patient in ED2. Chaplain offered spiritual support and pastoral prayer.     09/06/2016 0700  Clinical Encounter Type  Visited With Family  Visit Type Initial;Spiritual support  Referral From Nurse  Consult/Referral To Chaplain

## 2016-09-07 NOTE — Progress Notes (Signed)
Initial Nutrition Assessment  DOCUMENTATION CODES:   Morbid obesity  INTERVENTION:   If tube feeds initiated recommend Vital HP- Initiate at 6540ml/hr and increase by 8110ml/hr every 8 hours until goal rate of 8380ml/hr is reached.  Prostat liquid protein PO 30 ml BID   Free water flushes 30ml q 6 hrs  Regimen provides 2120kcal/day, 198g/day protein, 1725ml free water  NUTRITION DIAGNOSIS:   Inadequate oral intake related to inability to eat (sedated on vent) as evidenced by NPO status.  GOAL:   Provide needs based on ASPEN/SCCM guidelines  MONITOR:   Vent status, Labs, Weight trends, I & O's, TF initiation   REASON FOR ASSESSMENT:   Ventilator    ASSESSMENT:   51 y/o morbidly obese male with h/o DM, CHF, admitted for cardiac arrest of unclear etiology.  Pt sedated on vent. Pt morbidly obese. Pt currently with clamped OGT. EEG today. Cooling protocol today.    Medications reviewed and include: lasix, insulin, fentanyl, Mg Sulfate, midazolam, levophed, Ca chloride, nimbex, etomidate, Na Bicarbonate  Labs reviewed: Cl 100(L), BUN 23(H), creat 1.68(H), Alb 3.1(L), tbili 1.4(H) Wbc- 15.4(H) Glucose- 276(H)  Patient is currently intubated on ventilator support MV: 9.6 L/min No data recorded.  Propofol: none  Nutrition-Focused physical exam completed. Findings are no fat depletion, no muscle depletion, and no edema. Pt is morbidly obese  Diet Order:  Diet NPO time specified  Skin:  Reviewed, no issues  Last BM:  none since admit   Height:   Ht Readings from Last 1 Encounters:  09/18/2016 6' (1.829 m)    Weight:   Wt Readings from Last 1 Encounters:  09/06/2016 (!) 453 lb 4.3 oz (205.6 kg)    Ideal Body Weight:  80.9 kg  BMI:  Body mass index is 61.47 kg/m.  Estimated Nutritional Needs:   Kcal:  1800-2000kcal/day (22-25kcal/day IBW)  Protein:  >200g/day   Fluid:  641ml/kcal/day  EDUCATION NEEDS:   Education needs no appropriate at this time  Betsey Holidayasey  Hien Perreira MS, RD, LDN Pager #- [phone]

## 2016-09-07 NOTE — Progress Notes (Signed)
ANTICOAGULATION CONSULT NOTE   Pharmacy Consult for heparin drip management  Indication: elevated tropinins   Pharmacy consulted for heparin drip management for 51 yo male admitted s/p vfib arrest. Patient had CPR x 3 rounds. Patient intubated and sedated; currently undergoing targeted temperature management. Head CT is negative. Patient takes apixaban as an outpatient currently has elevated anti-Xa level.   Goal:  APTT: 68-109 anti-Xa: 0.3-0.7 Monitor platelets per protocol   Plan:  Last known dose of apixaban is not known. Will initiate patient on heparin 1800 units/hr with no bolus. Will manage patient with aPTTs until anti-Xa and aPTT levels correlate. Will obtain next aPTT level at 2000.   APTT (SEC) BOLUS DOSE STOP INFUSION (MINUTES) RATE CHANGE (mL/HR) REPEAT APTT  < 41 3000 units 0 + 1 ml/HR 6 HR  41 - 67 0 0 + 1 ml/HR 6 HR  68 - 109 0 0 NO CHANGE NEXT AM  110 - 136 0 0 - 1 ml/HR 6 HR  137 - 160 0 30 MIN - 1 ml/HR 6 HR  >160 0 60 MIN - 2 ml/HR 6 HR    Not on File  Patient Measurements: Height: 6' (182.9 cm) Weight: (!) 453 lb 4.3 oz (205.6 kg) IBW/kg (Calculated) : 77.6 Heparin Dosing Weight: 129kg  Vital Signs: Temp: 94.5 F (34.7 C) (06/11 1530) Temp Source: Core (Comment) (06/11 1530) BP: 109/91 (06/11 1530) Pulse Rate: 74 (06/11 1530)  Labs:  Recent Labs  09/13/2016 0750 09/06/2016 1208 09/18/2016 1440  HGB 13.2  --   --   HCT 40.7  --   --   PLT 257  --   --   APTT  --  29  --   LABPROT 16.6* 16.9*  --   INR 1.33 1.36  --   HEPARINUNFRC  --  2.42*  --   CREATININE 1.68* 1.96* 2.07*  TROPONINI 0.08* 0.44*  --     Estimated Creatinine Clearance: 79.5 mL/min (A) (by C-G formula based on SCr of 2.07 mg/dL (H)).   Pharmacy will continue to monitor and adjust per consult.   Simpson,Michael L 09/09/2016,3:39 PM

## 2016-09-07 NOTE — Progress Notes (Signed)
Patient with no urine output in ICU since arrival and CVP 22. When patient had arrived in ICU Dr. Nicholos Johnsamachandran did not want RN to give 80 mg IV lasix ordered in ED. MD ordered RN to give that lasix dose now. Team will continue to monitor.

## 2016-09-07 NOTE — Progress Notes (Signed)
Inpatient Diabetes Program Recommendations  AACE/ADA: New Consensus Statement on Inpatient Glycemic Control (2015)  Target Ranges:  Prepandial:   less than 140 mg/dL      Peak postprandial:   less than 180 mg/dL (1-2 hours)      Critically ill patients:  140 - 180 mg/dL   Results for Isaiah [name], Isaiah [name] (MRN [id number]) as of July 24, 2016 11:44  Ref. Range July 24, 2016 09:32  Glucose-Capillary Latest Ref Range: 65 - 99 mg/dL 409306 (H)  Results for Isaiah [name], Isaiah [name] (MRN [id number]) as of July 24, 2016 11:44  Ref. Range July 24, 2016 07:50  Glucose Latest Ref Range: 65 - 99 mg/dL 956276 (H)   Review of Glycemic Control  Diabetes history: DM2 Outpatient Diabetes medications: Amaryl 2 mg QAM, Metformin ER 1000 mg BID Current orders for Inpatient glycemic control: Novolog 0-9 units TID with meals, Novolog 0-5 units QHS  Inpatient Diabetes Program Recommendations: Insulin Correction: Please consider discontinuing current Glycemic Control order set with Novolog 0-9 units TID with meals and Novolog 0-5 units QHS. Then please consider using ICU Glycemic Control order set with CBGs and Novolog Q4H.  Thanks, Orlando PennerMarie Asar Evilsizer, RN, MSN, CDE Diabetes Coordinator Inpatient Diabetes Program [phone] (Team Pager from 8am to 5pm)

## 2016-09-08 ENCOUNTER — Inpatient Hospital Stay: Payer: BLUE CROSS/BLUE SHIELD

## 2016-09-08 ENCOUNTER — Inpatient Hospital Stay
Admit: 2016-09-08 | Discharge: 2016-09-08 | Disposition: A | Discharge status: Home / Self Care | Payer: BLUE CROSS/BLUE SHIELD | Attending: Internal Medicine | Admitting: Internal Medicine

## 2016-09-08 DIAGNOSIS — I469 Cardiac arrest, cause unspecified: Secondary | ICD-10-CM

## 2016-09-08 LAB — HEPARIN LEVEL (UNFRACTIONATED): HEPARIN UNFRACTIONATED: 2.72 [IU]/mL — AB (ref 0.30–0.70)

## 2016-09-08 LAB — HEMOGLOBIN A1C
HEMOGLOBIN A1C: 6.2 % — AB (ref 4.8–5.6)
Mean Plasma Glucose: 131 mg/dL

## 2016-09-08 LAB — BASIC METABOLIC PANEL
ANION GAP: 8 (ref 5–15)
ANION GAP: 9 (ref 5–15)
ANION GAP: 9 (ref 5–15)
ANION GAP: 8 (ref 5–15)
ANION GAP: 10 (ref 5–15)
ANION GAP: 11 (ref 5–15)
BUN: 36 mg/dL — AB (ref 6–20)
BUN: 38 mg/dL — ABNORMAL HIGH (ref 6–20)
BUN: 39 mg/dL — ABNORMAL HIGH (ref 6–20)
BUN: 40 mg/dL — ABNORMAL HIGH (ref 6–20)
BUN: 41 mg/dL — ABNORMAL HIGH (ref 6–20)
BUN: 42 mg/dL — ABNORMAL HIGH (ref 6–20)
CALCIUM: 8.4 mg/dL — AB (ref 8.9–10.3)
CALCIUM: 8.4 mg/dL — AB (ref 8.9–10.3)
CALCIUM: 7.7 mg/dL — AB (ref 8.9–10.3)
CHLORIDE: 102 mmol/L (ref 101–111)
CHLORIDE: 103 mmol/L (ref 101–111)
CHLORIDE: 102 mmol/L (ref 101–111)
CO2: 29 mmol/L (ref 22–32)
CO2: 28 mmol/L (ref 22–32)
CO2: 27 mmol/L (ref 22–32)
CO2: 26 mmol/L (ref 22–32)
CO2: 27 mmol/L (ref 22–32)
CO2: 25 mmol/L (ref 22–32)
CREATININE: 2.67 mg/dL — AB (ref 0.61–1.24)
Calcium: 8.2 mg/dL — ABNORMAL LOW (ref 8.9–10.3)
Calcium: 8.1 mg/dL — ABNORMAL LOW (ref 8.9–10.3)
Calcium: 8.1 mg/dL — ABNORMAL LOW (ref 8.9–10.3)
Chloride: 103 mmol/L (ref 101–111)
Chloride: 103 mmol/L (ref 101–111)
Chloride: 103 mmol/L (ref 101–111)
Creatinine, Ser: 2.46 mg/dL — ABNORMAL HIGH (ref 0.61–1.24)
Creatinine, Ser: 2.82 mg/dL — ABNORMAL HIGH (ref 0.61–1.24)
Creatinine, Ser: 3.05 mg/dL — ABNORMAL HIGH (ref 0.61–1.24)
Creatinine, Ser: 2.94 mg/dL — ABNORMAL HIGH (ref 0.61–1.24)
Creatinine, Ser: 3.05 mg/dL — ABNORMAL HIGH (ref 0.61–1.24)
GFR calc Af Amer: 34 mL/min — ABNORMAL LOW (ref >60)
GFR calc Af Amer: 29 mL/min — ABNORMAL LOW (ref >60)
GFR calc non Af Amer: 29 mL/min — ABNORMAL LOW (ref >60)
GFR calc non Af Amer: 25 mL/min — ABNORMAL LOW (ref >60)
GFR calc non Af Amer: 23 mL/min — ABNORMAL LOW (ref >60)
GFR calc non Af Amer: 24 mL/min — ABNORMAL LOW (ref >60)
GFR calc non Af Amer: 23 mL/min — ABNORMAL LOW (ref >60)
GFR, EST AFRICAN AMERICAN: 31 mL/min — AB (ref >60)
GFR, EST AFRICAN AMERICAN: 26 mL/min — AB (ref >60)
GFR, EST AFRICAN AMERICAN: 27 mL/min — AB (ref >60)
GFR, EST AFRICAN AMERICAN: 26 mL/min — AB (ref >60)
GFR, EST NON AFRICAN AMERICAN: 27 mL/min — AB (ref >60)
GLUCOSE: 134 mg/dL — AB (ref 65–99)
Glucose, Bld: 124 mg/dL — ABNORMAL HIGH (ref 65–99)
Glucose, Bld: 138 mg/dL — ABNORMAL HIGH (ref 65–99)
Glucose, Bld: 131 mg/dL — ABNORMAL HIGH (ref 65–99)
Glucose, Bld: 142 mg/dL — ABNORMAL HIGH (ref 65–99)
Glucose, Bld: 145 mg/dL — ABNORMAL HIGH (ref 65–99)
POTASSIUM: 4.1 mmol/L (ref 3.5–5.1)
POTASSIUM: 4.5 mmol/L (ref 3.5–5.1)
Potassium: 4 mmol/L (ref 3.5–5.1)
Potassium: 4.6 mmol/L (ref 3.5–5.1)
Potassium: 4.3 mmol/L (ref 3.5–5.1)
Potassium: 4.1 mmol/L (ref 3.5–5.1)
SODIUM: 140 mmol/L (ref 135–145)
SODIUM: 140 mmol/L (ref 135–145)
Sodium: 138 mmol/L (ref 135–145)
Sodium: 137 mmol/L (ref 135–145)
Sodium: 139 mmol/L (ref 135–145)
Sodium: 139 mmol/L (ref 135–145)

## 2016-09-08 LAB — GLUCOSE, CAPILLARY
GLUCOSE-CAPILLARY: 125 mg/dL — AB (ref 65–99)
GLUCOSE-CAPILLARY: 125 mg/dL — AB (ref 65–99)
GLUCOSE-CAPILLARY: 133 mg/dL — AB (ref 65–99)
GLUCOSE-CAPILLARY: 136 mg/dL — AB (ref 65–99)
GLUCOSE-CAPILLARY: 137 mg/dL — AB (ref 65–99)
GLUCOSE-CAPILLARY: 142 mg/dL — AB (ref 65–99)
GLUCOSE-CAPILLARY: 79 mg/dL (ref 65–99)
Glucose-Capillary: 112 mg/dL — ABNORMAL HIGH (ref 65–99)
Glucose-Capillary: 119 mg/dL — ABNORMAL HIGH (ref 65–99)
Glucose-Capillary: 127 mg/dL — ABNORMAL HIGH (ref 65–99)
Glucose-Capillary: 127 mg/dL — ABNORMAL HIGH (ref 65–99)
Glucose-Capillary: 160 mg/dL — ABNORMAL HIGH (ref 65–99)
Glucose-Capillary: 197 mg/dL — ABNORMAL HIGH (ref 65–99)
Glucose-Capillary: 72 mg/dL (ref 65–99)

## 2016-09-08 LAB — TROPONIN I
Troponin I: 0.21 ng/mL (ref <0.03)
Troponin I: 0.15 ng/mL (ref <0.03)

## 2016-09-08 LAB — CBC
HCT: 41.3 % (ref 40.0–52.0)
Hemoglobin: 13.3 g/dL (ref 13.0–18.0)
MCH: 25.9 pg — AB (ref 26.0–34.0)
MCHC: 32.2 g/dL (ref 32.0–36.0)
MCV: 80.5 fL (ref 80.0–100.0)
PLATELETS: 178 10*3/uL (ref 150–440)
RBC: 5.13 MIL/uL (ref 4.40–5.90)
RDW: 16.7 % — AB (ref 11.5–14.5)
WBC: 8.2 10*3/uL (ref 3.8–10.6)

## 2016-09-08 LAB — ECHOCARDIOGRAM COMPLETE
HEIGHTINCHES: 72 in
Weight: 7252.25 oz

## 2016-09-08 LAB — APTT
aPTT: 139 seconds — ABNORMAL HIGH (ref 24–36)
aPTT: >160 seconds (ref 24–36)

## 2016-09-08 LAB — PHOSPHORUS: PHOSPHORUS: 4.2 mg/dL (ref 2.5–4.6)

## 2016-09-08 LAB — MAGNESIUM: MAGNESIUM: 2.4 mg/dL (ref 1.7–2.4)

## 2016-09-08 LAB — HIV ANTIBODY (ROUTINE TESTING W REFLEX): HIV Screen 4th Generation wRfx: NONREACTIVE

## 2016-09-08 MED ORDER — INSULIN GLARGINE 100 UNIT/ML ~~LOC~~ SOLN
10.0000 [IU] | SUBCUTANEOUS | Status: DC
  Administered 2016-09-08: 10 [IU] via SUBCUTANEOUS
  Filled 2016-09-08: qty 0.1

## 2016-09-08 MED ORDER — AMIODARONE HCL IN DEXTROSE 360-4.14 MG/200ML-% IV SOLN
60.0000 mg/h | INTRAVENOUS | Status: DC
  Administered 2016-09-09 – 2016-09-14 (×13): 30 mg/h via INTRAVENOUS
  Administered 2016-09-15 – 2016-09-16 (×5): 60 mg/h via INTRAVENOUS
  Filled 2016-09-08 (×19): qty 200

## 2016-09-08 MED ORDER — FUROSEMIDE 10 MG/ML IJ SOLN
60.0000 mg | Freq: Three times a day (TID) | INTRAMUSCULAR | Status: DC
  Administered 2016-09-08 – 2016-09-11 (×9): 60 mg via INTRAVENOUS
  Filled 2016-09-08 (×8): qty 6

## 2016-09-08 MED ORDER — HEPARIN (PORCINE) IN NACL 100-0.45 UNIT/ML-% IJ SOLN
1500.0000 [IU]/h | INTRAMUSCULAR | Status: DC
  Administered 2016-09-08: 1600 [IU]/h via INTRAVENOUS
  Administered 2016-09-08: 1500 [IU]/h via INTRAVENOUS
  Filled 2016-09-08: qty 250

## 2016-09-08 MED ORDER — INSULIN GLARGINE 100 UNIT/ML ~~LOC~~ SOLN: 5.0000 [IU] | SUBCUTANEOUS | Status: DC

## 2016-09-08 MED ORDER — DILTIAZEM HCL 60 MG PO TABS
60.0000 mg | ORAL_TABLET | Freq: Three times a day (TID) | ORAL | Status: DC
  Administered 2016-09-08 – 2016-09-13 (×14): 60 mg via ORAL
  Filled 2016-09-08 (×15): qty 1

## 2016-09-08 MED ORDER — AMIODARONE LOAD VIA INFUSION
150.0000 mg | Freq: Once | INTRAVENOUS | Status: AC
  Administered 2016-09-08: 150 mg via INTRAVENOUS
  Filled 2016-09-08: qty 83.34

## 2016-09-08 MED ORDER — INSULIN GLARGINE 100 UNIT/ML ~~LOC~~ SOLN
10.0000 [IU] | SUBCUTANEOUS | Status: DC
  Administered 2016-09-09 – 2016-09-16 (×8): 10 [IU] via SUBCUTANEOUS
  Filled 2016-09-08 (×8): qty 0.1

## 2016-09-08 MED ORDER — HEPARIN (PORCINE) IN NACL 100-0.45 UNIT/ML-% IJ SOLN: 1300.0000 [IU]/h | INTRAMUSCULAR | Status: DC

## 2016-09-08 MED ORDER — AMIODARONE HCL IN DEXTROSE 360-4.14 MG/200ML-% IV SOLN
60.0000 mg/h | INTRAVENOUS | Status: AC
  Administered 2016-09-08 (×2): 60 mg/h via INTRAVENOUS
  Filled 2016-09-08 (×2): qty 200

## 2016-09-08 MED ORDER — VITAL HIGH PROTEIN PO LIQD
1000.0000 mL | ORAL | Status: DC
  Administered 2016-09-08 (×2): 1000 mL
  Administered 2016-09-09: 20:00:00
  Administered 2016-09-09 – 2016-09-10 (×4): 1000 mL
  Administered 2016-09-10: 14:00:00

## 2016-09-08 MED ORDER — INSULIN ASPART 100 UNIT/ML ~~LOC~~ SOLN
2.0000 [IU] | SUBCUTANEOUS | Status: DC
  Administered 2016-09-08: 2 [IU] via SUBCUTANEOUS
  Administered 2016-09-08: 4 [IU] via SUBCUTANEOUS
  Administered 2016-09-08 – 2016-09-09 (×2): 2 [IU] via SUBCUTANEOUS
  Administered 2016-09-09: 4 [IU] via SUBCUTANEOUS
  Administered 2016-09-09 – 2016-09-11 (×9): 2 [IU] via SUBCUTANEOUS
  Administered 2016-09-11: 4 [IU] via SUBCUTANEOUS
  Administered 2016-09-11 – 2016-09-14 (×8): 2 [IU] via SUBCUTANEOUS
  Administered 2016-09-14: 4 [IU] via SUBCUTANEOUS
  Administered 2016-09-14 (×2): 2 [IU] via SUBCUTANEOUS
  Administered 2016-09-14 – 2016-09-15 (×2): 4 [IU] via SUBCUTANEOUS
  Administered 2016-09-15: 2 [IU] via SUBCUTANEOUS
  Administered 2016-09-15: 4 [IU] via SUBCUTANEOUS
  Administered 2016-09-15 – 2016-09-16 (×3): 2 [IU] via SUBCUTANEOUS
  Filled 2016-09-08: qty 2
  Filled 2016-09-08 (×4): qty 1
  Filled 2016-09-08: qty 4
  Filled 2016-09-08 (×3): qty 1
  Filled 2016-09-08: qty 2
  Filled 2016-09-08 (×3): qty 1
  Filled 2016-09-08: qty 2
  Filled 2016-09-08 (×11): qty 1
  Filled 2016-09-08: qty 2
  Filled 2016-09-08 (×7): qty 1

## 2016-09-08 MED ORDER — HEPARIN (PORCINE) IN NACL 100-0.45 UNIT/ML-% IJ SOLN: 1700.0000 [IU]/h | INTRAMUSCULAR | Status: DC

## 2016-09-08 NOTE — Progress Notes (Signed)
RN discussed with Isaiah Harmanana, NP that patient has been transitioned to ICU glycemic control phase 3 and that most recent blood glucose is 72 and per order lantus 10 units should be given at this time.  RN also made NP aware that insulin drip has been on hold for an hour prior to blood glucose of 72 being obtained.  Isaiah Harmanana, NP reviewed patient's labs and gave order to go ahead and give the 10 units of lantus at this time.

## 2016-09-08 NOTE — Progress Notes (Signed)
CH made a follow up visit. Pt was resting. CH prayed silent prayer at door.    09/08/16 1400  Clinical Encounter Type  Visited With Patient  Visit Type Follow-up;Critical Care  Consult/Referral To Chaplain  Spiritual Encounters  Spiritual Needs Prayer

## 2016-09-08 NOTE — Progress Notes (Signed)
Admission documentation not complete due to pt's status.

## 2016-09-08 NOTE — Progress Notes (Signed)
[facility] Critical Care Medicine Consultation    SYNOPSIS   51 yo male with DM, afib,  cardiac arrest at home with Vfib arrest. Resuscitated, now unresponsive, cooling Protocol continues.  ASSESSMENT/PLAN    PULMONARY A:Acute hypoxic respiratory failure with cardiac arrest. I personally reviewed chest imaging, reduced sensitivity due to body habitus, there isContinued cardiomegaly with reduced lung volumes from obesity. Possible pulmonary edema.   Blood gas 7.29/55/168/26.4. Consistent with mild respiratory acidosis. P:   Continue vent support, no weaning at this time  VENTILATOR SETTINGS: Vent Mode: PRVC FiO2 (%):  [50 %-100 %] 50 % Set Rate:  [16 bmp] 16 bmp Vt Set:  [500 mL] 500 mL PEEP:  [10 cmH20-12 cmH20] 12 cmH20 Plateau Pressure:  [27 cmH20] 27 cmH20  CARDIOVASCULAR A: Cardiac and vfib arrest.  no evidence of acute MI, cardiology following P:  Continue  supportive measures.  Continue IV heparin, given history of atrial fibrillation.  Left IJ CVL was noted to be going into the left brachiocephalic vein, this was retracted proximally 6 cm and secured in place, using standard sterile barrier/precautions. Repeat chest x-ray shows adequate placement in left internal jugular. Given the patient's difficulty in access (patient is 450 pounds with a thick neck), we'll maintain this access.  HEMODYNAMICS: CVP:  [0 mmHg-46 mmHg] 24 mmHg  RENAL A:  Oliguric acute renal failure. P:   Likely secondary to above, continue to monitor.  Will add diuresis today, if not improved over next 24 hours, we'll likely require dialysis, and will consult nephrology, at that time.  INTAKE / OUTPUT:  Intake/Output Summary (Last 24 hours) at 09/08/16 1235 Last data filed at 09/08/16 0900  Gross per 24 hour  Intake          1107.46 ml  Output              232 ml  Net           875.46 ml    GASTROINTESTINAL A:  -- HEMATOLOGIC A:  --  INFECTIOUS A:  -- P:    Micro/culture  results:  BCx2 pending.  UC -- Sputum pending  Antibiotics:   ENDOCRINE A:  DM  P:   SSI  NEUROLOGIC A:  Acute anoxic encephalopathy.  CT head negative.  P:   --Continue hypothermia protocol hypothermia protocol.  --Family updated today, prognosis guarded.    MAJOR EVENTS/TEST RESULTS: 06/11:Cardiac arrest, admitted, intubated, CVL placed 06/12: Minimal urine output, CVL retracted.  Best Practices  DVT Prophylaxis: IV heparin GI Prophylaxis: PPI  ---------------------------------------  ---------------------------------------   Name: HOBSON LAX MRN: [id number] DOB: Sep 16, 1965    ADMISSION DATE:  09/09/2016 Subjective.  The pati  REVIEW OF SYSTEMS:   History could not be obtained due to unresponsiveness.   VITAL SIGNS: Temp:  [90.9 F (32.7 C)-97.2 F (36.2 C)] 91.2 F (32.9 C) (06/12 1100) Pulse Rate:  [34-106] 106 (06/12 1100) Resp:  [0-27] 16 (06/12 1100) BP: (99-134)/(57-109) 119/86 (06/12 1100) SpO2:  [84 %-100 %] 93 % (06/12 1100) FiO2 (%):  [50 %-100 %] 50 % (06/12 1100) HEMODYNAMICS: CVP:  [0 mmHg-46 mmHg] 24 mmHg VENTILATOR SETTINGS: Vent Mode: PRVC FiO2 (%):  [50 %-100 %] 50 % Set Rate:  [16 bmp] 16 bmp Vt Set:  [500 mL] 500 mL PEEP:  [10 cmH20-12 cmH20] 12 cmH20 Plateau Pressure:  [27 cmH20] 27 cmH20 INTAKE / OUTPUT:  Intake/Output Summary (Last 24 hours) at 09/08/16 1235 Last data filed at 09/08/16 0900  Gross per 24  hour  Intake          1107.46 ml  Output              232 ml  Net           875.46 ml    Physical Examination:   VS: BP 119/86   Pulse (!) 106   Temp (!) 91.2 F (32.9 C) (Core (Comment)) Comment (Src): foley  Resp 16   Ht 6' (1.829 m)   Wt (!) 453 lb 4.3 oz (205.6 kg)   SpO2 93%   BMI 61.47 kg/m   General Appearance: No distress  Neuro:without focal findings, mental status reduced.  HEENT: PERRLA, EOM intact, no ptosis, no other lesions noticed;  Pulmonary: decreased air entry bilaterally.    CardiovascularNormal S1,S2.  No m/r/g.    Abdomen: Benign, Soft, non-tender, No masses, hepatosplenomegaly, No lymphadenopathy Renal:  No costovertebral tenderness  GU:  Not performed at this time. Endoc: No evident thyromegaly, no signs of acromegaly. Skin:   warm, no rashes, no ecchymosis  Extremities: normal, no cyanosis, clubbing, no edema, warm with normal capillary refill.    LABS: Reviewed   LABORATORY PANEL:   CBC  Recent Labs Lab 09/08/16 0152  WBC 8.2  HGB 13.3  HCT 41.3  PLT 178    Chemistries   Recent Labs Lab 09/03/2016 0750  09/08/16 0152  09/08/16 1110  NA 137  < > 140  < > 138  K 3.7  < > 4.1  < > 4.6  CL 100*  < > 103  < > 102  CO2 28  < > 29  < > 27  GLUCOSE 276*  < > 134*  < > 138*  BUN 23*  < > 36*  < > 39*  CREATININE 1.68*  < > 2.46*  < > 2.82*  CALCIUM 9.2  < > 8.4*  < > 8.2*  MG  --   < > 2.4  --   --   PHOS  --   --  4.2  --   --   AST 102*  --   --   --   --   ALT 60  --   --   --   --   ALKPHOS 93  --   --   --   --   BILITOT 1.4*  --   --   --   --   < > = values in this interval not displayed.   Recent Labs Lab 09/08/16 0351 09/08/16 0500 09/08/16 0619 09/08/16 0721 09/08/16 0906 09/08/16 1111  GLUCAP 119* 112* 72 79 125* 136*    Recent Labs Lab 09/24/2016 0753 09/13/2016 1030 09/08/16 0500  PHART 7.31* 7.31* 7.29*  PCO2ART 57* 58* 55*  PO2ART 61* 95 168*    Recent Labs Lab 09/06/2016 0750  AST 102*  ALT 60  ALKPHOS 93  BILITOT 1.4*  ALBUMIN 3.1*    Cardiac Enzymes  Recent Labs Lab 09/08/16 0532  TROPONINI 0.15*    RADIOLOGY:  Dg Chest 1 View  Result Date: 09/08/2016 CLINICAL DATA:  [age] year old male with dyspnea. Subsequent encounter. EXAM: CHEST 1 VIEW COMPARISON:  09/16/2016 FINDINGS: Left central line in the atypical position. On recent chest CT, this appears outside the left brachycephalic vein coursing over the aortic arch. This needs to be removed. Endotracheal tube tip 3.2 cm above the carina.  Cardiomegaly. Suspect mild pulmonary edema without significant change. IMPRESSION: Left central line in the atypical position.  On recent chest CT, this appears outside the left brachycephalic vein coursing over the aortic arch. This needs to be removed. Cardiomegaly. Suspect mild pulmonary edema without significant change. These results were called by telephone at the time of interpretation on 09/08/2016 at 7:04 am to Adron Bene, RN who verbally acknowledged these results. Electronically Signed   By: Lacy Duverney M.D.   On: 09/08/2016 07:12   Ct Head Wo Contrast  Result Date: September 16, 2016 CLINICAL DATA:  Cardiac arrest. EXAM: CT HEAD WITHOUT CONTRAST TECHNIQUE: Contiguous axial images were obtained from the base of the skull through the vertex without intravenous contrast. COMPARISON:  None. FINDINGS: Brain: No evidence of acute infarction, hemorrhage, hydrocephalus, extra-axial collection or mass lesion/mass effect. Vascular: No hyperdense vessel or unexpected calcification. Skull: Normal. Negative for fracture or focal lesion. Sinuses/Orbits: Scattered areas of mucosal disease in the maxillary sinuses. Minimal sinus disease in the sphenoid sinuses. Other: None. IMPRESSION: No acute intracranial abnormality. Paranasal sinus disease. Electronically Signed   By: Richarda Overlie M.D.   On: September 16, 2016 09:43   Ct Angio Chest Pe W/cm &/or Wo Cm  Result Date: Sep 16, 2016 CLINICAL DATA:  Cardiac arrest. EXAM: CT ANGIOGRAPHY CHEST WITH CONTRAST TECHNIQUE: Multidetector CT imaging of the chest was performed using the standard protocol during bolus administration of intravenous contrast. Multiplanar CT image reconstructions and MIPs were obtained to evaluate the vascular anatomy. CONTRAST:  75 cc of Isovue 370 COMPARISON:  None FINDINGS: Cardiovascular: The heart size is enlarged. There does not appear to be evidence of right heart strain. No definite evidence for embolus within the main pulmonary artery or the lobar  branches. Examination is nondiagnostic for the segmental, and subsegmental branches. Mediastinum/Nodes: ET tube tip is above the carina. The nasogastric tube with tip in the stomach. Study is very difficult to determine whether not there are any enlarged mediastinal or hilar lymph nodes. No axillary or supraclavicular adenopathy. Lungs/Pleura: No pleural effusion. Bilateral upper and lower lobe posterior predominant atelectasis and subpleural consolidation. Upper Abdomen: No acute abnormality. Musculoskeletal: There is a fracture through the mid body of the sternum, image 54 of series 9. Fracture involving the posterior aspect of the left third rib noted. Review of the MIP images confirms the above findings. IMPRESSION: 1. Markedly diminished exam detail secondary to patient's body habitus. Patient reportedly weighs greater than 500 pounds. 2. No evidence for pulmonary embolus in the main pulmonary artery or the lobar branches. The exam is nondiagnostic for segmental and subsegmental branches. 3. Sternal fracture.  Left posterior third rib fracture. 4. Bilateral posterior atelectasis and subpleural consolidation. Electronically Signed   By: Signa Kell M.D.   On: 09/16/16 09:57   Dg Chest Port 1 View  Result Date: 09/08/2016 CLINICAL DATA:  Central line placement EXAM: PORTABLE CHEST 1 VIEW COMPARISON:  09/08/2016 FINDINGS: Endotracheal tube with the tip 5.6 cm above the carina. Nasogastric tube coursing below the diaphragm. Left central venous catheter with the tip projecting over the proximal jugular vein, but is not well delineated secondary to technique. Mild bilateral diffuse interstitial thickening. No pleural effusion or pneumothorax. Stable cardiomegaly. IMPRESSION: 1. Endotracheal tube with the tip 5.6 cm above the carina. 2. Left central venous catheter with the tip projecting over the proximal jugular vein, but is not well delineated secondary to technique. 3. Cardiomegaly with mild pulmonary  vascular congestion. Electronically Signed   By: Elige Ko   On: 09/08/2016 09:50   Dg Chest Port 1 View  Result Date: 2016-09-16 CLINICAL DATA:  Line placement after cardiac arrest today. EXAM: PORTABLE CHEST 1 VIEW COMPARISON:  CT chest and chest radiograph 09/06/2016. FINDINGS: Endotracheal tube terminates 4.6 cm above the carina. Left IJ central line tip is poorly visualized due to body habitus. It is followed into the left brachiocephalic region but is difficult to follow any further. Nasogastric tube is followed into the distal esophagus but again, is difficult to follow further due to body habitus. Defibrillator pad is seen over the left chest. Heart is enlarged. Lungs are somewhat low in volume with mild interstitial prominence and indistinctness. No definite pleural fluid. No pneumothorax. IMPRESSION: 1. Body habitus is limiting and degrades image quality. 2. Left IJ central line tip is followed into the left brachiocephalic vein but is difficult to see beyond that due to body habitus. 3. Suspect mild pulmonary edema. Electronically Signed   By: Leanna Battles M.D.   On: 09/05/2016 11:31   Dg Chest Port 1 View  Result Date: 09/13/2016 CLINICAL DATA:  Hypoxia.  Status post cardiac arrest EXAM: PORTABLE CHEST 1 VIEW COMPARISON:  None. FINDINGS: Endotracheal tube tip is 3.5 cm above the carina. Nasogastric to tip and side port are below the diaphragm. No pneumothorax. There is cardiomegaly with pulmonary venous hypertension. There is slight interstitial edema. There is no appreciable airspace consolidation. No adenopathy evident. No bone lesions. IMPRESSION: Tube positions as described without pneumothorax. Evidence a degree of congestive heart failure without consolidation or volume loss. Electronically Signed   By: Bretta Bang III M.D.   On: 09/26/2016 08:33       --Wells Guiles, MD.  Board Certified in Internal Medicine, Pulmonary Medicine, Critical Care Medicine, and Sleep  Medicine.  ICU Pager [phone] [facility] Office Number: [phone]  Santiago Glad, M.D.  Billy Fischer, M.D   09/08/2016, 12:35 PM  Critical Care Attestation.  I have personally obtained a history, examined the patient, evaluated laboratory and imaging results, formulated the assessment and plan and placed orders. The Patient requires high complexity decision making for assessment and support, frequent evaluation and titration of therapies, application of advanced monitoring technologies and extensive interpretation of multiple databases. The patient has critical illness that could lead imminently to failure of 1 or more organ systems and requires the highest level of physician preparedness to intervene.  Critical Care Time devoted to patient care services described in this note is 45 minutes and is exclusive of time spent in procedures supervisory time of NP.

## 2016-09-08 NOTE — Progress Notes (Signed)
Radiologist notified RN over the phone ([facility] about malposition of patient's central line on chest x-ray. Changed infusions to patient's peripherals, will notify MD when he is out of the [employer].

## 2016-09-08 NOTE — Progress Notes (Signed)
*  PRELIMINARY RESULTS* Echocardiogram 2D Echocardiogram has been performed.  Cristela BlueHege, Yanis Juma 09/08/2016, 11:03 AM

## 2016-09-08 NOTE — Progress Notes (Signed)
Patient ID: Isaiah [name], male   DOB: 03/06/1966, 51 y.o.   MRN: [id number]  [facility] PROGRESS NOTE  DELVIS KAU HYH:[id number] DOB: 1966/03/30 DOA: 08/30/2016 PCP: Patient, No Pcp Per  HPI/Subjective: Patient unresponsive to sternal rub. Came in with cardiac arrest.  Objective: Vitals:   09/08/16 1300 09/08/16 1400  BP: 108/78 99/82  Pulse: 88 94  Resp: 16 16  Temp: (!) 90.7 F (32.6 C) (!) 90.5 F (32.5 C)    Filed Weights   09/16/2016 0954 09/08/16 1406  Weight: (!) 205.6 kg (453 lb 4.3 oz) (!) 207.7 kg (458 lb)    ROS: Review of Systems  Unable to perform ROS: Critical illness   Exam: Physical Exam  HENT:  Nose: No mucosal edema.  Unable to look into mouth at this time  Eyes: Conjunctivae and lids are normal.  Pupils pinpoint  Neck: Carotid bruit is not present. No thyroid mass and no thyromegaly present.  Cardiovascular: S1 normal and S2 normal.  Exam reveals distant heart sounds. Exam reveals no gallop.   No murmur heard. Pulses:      Dorsalis pedis pulses are 2+ on the right side, and 2+ on the left side.  Respiratory: No respiratory distress. He has decreased breath sounds in the right middle field, the right lower field, the left middle field and the left lower field. He has no wheezes. He has no rhonchi. He has no rales.  GI: Soft. Bowel sounds are normal. There is no tenderness.  Musculoskeletal:       Right knee: He exhibits swelling.       Left knee: He exhibits swelling.       Right ankle: He exhibits swelling.       Left ankle: He exhibits swelling.  Lymphadenopathy:    He has no cervical adenopathy.  Neurological: He is unresponsive.  Skin: No rash noted. Nails show no clubbing.  Psychiatric:  Unable to assess at this time secondary to unresponsive      Data Reviewed: Basic Metabolic Panel:  Recent Labs Lab 09/26/2016 2036 09/01/2016 2257 09/08/16 0152 09/08/16 0532 09/08/16 1110 09/08/16 1418  NA  --  139 140 140 138 137  K   --  3.6 4.1 4.0 4.6 4.5  CL  --  105 103 103 102 103  CO2  --  _0 GLUCOSE  --  158* 134* 124* 138* 131*  BUN  --  33* 36* 38* 39* 40*  CREATININE  --  2.16* 2.46* 2.67* 2.82* 3.05*  CALCIUM  --  7.8* 8.4* 8.4* 8.2* 8.1*  MG 2.2  --  2.4  --   --   --   PHOS  --   --  4.2  --   --   --    Liver Function Tests:  Recent Labs Lab 09/20/2016 0750  AST 102*  ALT 60  ALKPHOS 93  BILITOT 1.4*  PROT 7.0  ALBUMIN 3.1*   CBC:  Recent Labs Lab 09/24/2016 0750 09/08/16 0152  WBC 15.4* 8.2  NEUTROABS 12.6*  --   HGB 13.2 13.3  HCT 40.7 41.3  MCV 80.8 80.5  PLT 257 178   Cardiac Enzymes:  Recent Labs Lab 09/16/2016 0750 09/17/2016 1208 08/29/2016 1632 09/08/16 0152 09/08/16 0532  TROPONINI 0.08* 0.44* 0.47* 0.21* 0.15*   BNP (last 3 results)  Recent Labs  09/04/2016 0750  BNP 508.0*     CBG:  Recent Labs Lab 09/08/16 0619 09/08/16 8206  09/08/16 0906 09/08/16 1111 09/08/16 1417  GLUCAP 72 79 125* 136* 127*    Recent Results (from the past 240 hour(s))  Culture, blood (routine x 2)     Status: None (Preliminary result)   Collection Time: 08/29/2016  8:18 AM  Result Value Ref Range Status   Specimen Description BLOOD L AC  Final   Special Requests   Final    BOTTLES DRAWN AEROBIC AND ANAEROBIC Blood Culture adequate volume   Culture NO GROWTH < 24 HOURS  Final   Report Status PENDING  Incomplete  MRSA PCR Screening     Status: None   Collection Time: 09/24/2016  9:46 AM  Result Value Ref Range Status   MRSA by PCR NEGATIVE NEGATIVE Final    Comment:        The GeneXpert MRSA Assay (FDA approved for NASAL specimens only), is one component of a comprehensive MRSA colonization surveillance program. It is not intended to diagnose MRSA infection nor to guide or monitor treatment for MRSA infections.   Culture, blood (routine x 2)     Status: None (Preliminary result)   Collection Time: 09/04/2016 11:45 AM  Result Value Ref Range Status   Specimen  Description BLOOD L HAND  Final   Special Requests   Final    BOTTLES DRAWN AEROBIC ONLY Blood Culture results may not be optimal due to an inadequate volume of blood received in culture bottles   Culture NO GROWTH < 24 HOURS  Final   Report Status PENDING  Incomplete  Culture, respiratory (NON-Expectorated)     Status: None (Preliminary result)   Collection Time: 09/12/2016  6:32 PM  Result Value Ref Range Status   Specimen Description TRACHEAL ASPIRATE  Final   Special Requests NONE  Final   Gram Stain   Final    FEW WBC PRESENT, PREDOMINANTLY PMN MODERATE GRAM POSITIVE COCCI IN CHAINS IN PAIRS MODERATE GRAM NEGATIVE RODS Performed at [facility], [place] [address], [place], [place] [address]    Culture PENDING  Incomplete   Report Status PENDING  Incomplete     Studies: Dg Chest 1 View  Result Date: 09/08/2016 CLINICAL DATA:  51 year old male with dyspnea. Subsequent encounter. EXAM: CHEST 1 VIEW COMPARISON:  08/31/2016 FINDINGS: Left central line in the atypical position. On recent chest CT, this appears outside the left brachycephalic vein coursing over the aortic arch. This needs to be removed. Endotracheal tube tip 3.2 cm above the carina. Cardiomegaly. Suspect mild pulmonary edema without significant change. IMPRESSION: Left central line in the atypical position. On recent chest CT, this appears outside the left brachycephalic vein coursing over the aortic arch. This needs to be removed. Cardiomegaly. Suspect mild pulmonary edema without significant change. These results were called by telephone at the time of interpretation on 09/08/2016 at 7:04 am to Adalberto Ill, RN who verbally acknowledged these results. Electronically Signed   By: Genia Del M.D.   On: 09/08/2016 07:12   Ct Head Wo Contrast  Result Date: 08/28/2016 CLINICAL DATA:  Cardiac arrest. EXAM: CT HEAD WITHOUT CONTRAST TECHNIQUE: Contiguous axial images were obtained from the base of the skull through the vertex  without intravenous contrast. COMPARISON:  None. FINDINGS: Brain: No evidence of acute infarction, hemorrhage, hydrocephalus, extra-axial collection or mass lesion/mass effect. Vascular: No hyperdense vessel or unexpected calcification. Skull: Normal. Negative for fracture or focal lesion. Sinuses/Orbits: Scattered areas of mucosal disease in the maxillary sinuses. Minimal sinus disease in the sphenoid sinuses. Other: None. IMPRESSION:  No acute intracranial abnormality. Paranasal sinus disease. Electronically Signed   By: Markus Daft M.D.   On: 09/17/2016 09:43   Ct Angio Chest Pe W/cm &/or Wo Cm  Result Date: 09/04/2016 CLINICAL DATA:  Cardiac arrest. EXAM: CT ANGIOGRAPHY CHEST WITH CONTRAST TECHNIQUE: Multidetector CT imaging of the chest was performed using the standard protocol during bolus administration of intravenous contrast. Multiplanar CT image reconstructions and MIPs were obtained to evaluate the vascular anatomy. CONTRAST:  75 cc of Isovue 370 COMPARISON:  None FINDINGS: Cardiovascular: The heart size is enlarged. There does not appear to be evidence of right heart strain. No definite evidence for embolus within the main pulmonary artery or the lobar branches. Examination is nondiagnostic for the segmental, and subsegmental branches. Mediastinum/Nodes: ET tube tip is above the carina. The nasogastric tube with tip in the stomach. Study is very difficult to determine whether not there are any enlarged mediastinal or hilar lymph nodes. No axillary or supraclavicular adenopathy. Lungs/Pleura: No pleural effusion. Bilateral upper and lower lobe posterior predominant atelectasis and subpleural consolidation. Upper Abdomen: No acute abnormality. Musculoskeletal: There is a fracture through the mid body of the sternum, image 54 of series 9. Fracture involving the posterior aspect of the left third rib noted. Review of the MIP images confirms the above findings. IMPRESSION: 1. Markedly diminished exam detail  secondary to patient's body habitus. Patient reportedly weighs greater than 500 pounds. 2. No evidence for pulmonary embolus in the main pulmonary artery or the lobar branches. The exam is nondiagnostic for segmental and subsegmental branches. 3. Sternal fracture.  Left posterior third rib fracture. 4. Bilateral posterior atelectasis and subpleural consolidation. Electronically Signed   By: Kerby Moors M.D.   On: 09/05/2016 09:57   Dg Chest Port 1 View  Result Date: 09/08/2016 CLINICAL DATA:  Central line placement EXAM: PORTABLE CHEST 1 VIEW COMPARISON:  09/08/2016 FINDINGS: Endotracheal tube with the tip 5.6 cm above the carina. Nasogastric tube coursing below the diaphragm. Left central venous catheter with the tip projecting over the proximal jugular vein, but is not well delineated secondary to technique. Mild bilateral diffuse interstitial thickening. No pleural effusion or pneumothorax. Stable cardiomegaly. IMPRESSION: 1. Endotracheal tube with the tip 5.6 cm above the carina. 2. Left central venous catheter with the tip projecting over the proximal jugular vein, but is not well delineated secondary to technique. 3. Cardiomegaly with mild pulmonary vascular congestion. Electronically Signed   By: Kathreen Devoid   On: 09/08/2016 09:50   Dg Chest Port 1 View  Result Date: 08/28/2016 CLINICAL DATA:  Line placement after cardiac arrest today. EXAM: PORTABLE CHEST 1 VIEW COMPARISON:  CT chest and chest radiograph 08/30/2016. FINDINGS: Endotracheal tube terminates 4.6 cm above the carina. Left IJ central line tip is poorly visualized due to body habitus. It is followed into the left brachiocephalic region but is difficult to follow any further. Nasogastric tube is followed into the distal esophagus but again, is difficult to follow further due to body habitus. Defibrillator pad is seen over the left chest. Heart is enlarged. Lungs are somewhat low in volume with mild interstitial prominence and  indistinctness. No definite pleural fluid. No pneumothorax. IMPRESSION: 1. Body habitus is limiting and degrades image quality. 2. Left IJ central line tip is followed into the left brachiocephalic vein but is difficult to see beyond that due to body habitus. 3. Suspect mild pulmonary edema. Electronically Signed   By: Lorin Picket M.D.   On: 09/23/2016 11:31   Dg  Chest Port 1 View  Result Date: 09/08/2016 CLINICAL DATA:  Hypoxia.  Status post cardiac arrest EXAM: PORTABLE CHEST 1 VIEW COMPARISON:  None. FINDINGS: Endotracheal tube tip is 3.5 cm above the carina. Nasogastric to tip and side port are below the diaphragm. No pneumothorax. There is cardiomegaly with pulmonary venous hypertension. There is slight interstitial edema. There is no appreciable airspace consolidation. No adenopathy evident. No bone lesions. IMPRESSION: Tube positions as described without pneumothorax. Evidence a degree of congestive heart failure without consolidation or volume loss. Electronically Signed   By: Lowella Grip III M.D.   On: 08/29/2016 08:33    Scheduled Meds: . budesonide (PULMICORT) nebulizer solution  0.5 mg Nebulization BID  . chlorhexidine gluconate (MEDLINE KIT)  15 mL Mouth Rinse BID  . diltiazem  60 mg Oral Q8H  . feeding supplement (VITAL HIGH PROTEIN)  1,000 mL Per Tube Q24H  . furosemide  60 mg Intravenous Q8H  . insulin aspart  2-6 Units Subcutaneous Q4H  . [START ON 09/09/2016] insulin glargine  10 Units Subcutaneous Q24H  . ipratropium-albuterol  3 mL Nebulization Q6H  . mouth rinse  15 mL Mouth Rinse 10 times per day  . pantoprazole (PROTONIX) IV  40 mg Intravenous Q24H  . sodium chloride flush  10-40 mL Intracatheter Q12H   Continuous Infusions: . sodium chloride 10 mL/hr at 09/08/16 0700  . cisatracurium (NIMBEX) infusion 1 mcg/kg/min (09/08/16 0700)  . epinephrine    . fentaNYL infusion INTRAVENOUS 125 mcg/hr (09/08/16 0715)  . heparin    . midazolam (VERSED) infusion 2 mg/hr  (09/08/16 0700)  . norepinephrine (LEVOPHED) Adult infusion      Assessment/Plan:  1. Cardiac arrest. My suspicion this is arrhythmia related with an EF of 10% seen on echocardiogram. Patient is under cooling protocol starting yesterday. 2. 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 On Lasix 60 mg IV every 8 hours. Other cardiac meds on hold at this point secondary to relatively low blood pressure. 3. Acute kidney injury secondary to cardiac arrest, CT scan with contrast. Case discussed with critical care specialist. Patient may end up needing dialysis. 4. Acute encephalopathy. Patient currently on sedation with fentanyl. EEG showing no seizures. 5. Type 2 diabetes mellitus. On sliding scale and low-dose Levemir. 6. Essential hypertension. Antihypertensives on hold for relative low blood pressure 7. Atrial fibrillation. Patient on heparin drip at this point. Critical care specialist put on Cardizem low dose. 8. Morbid obesity and likely sleep apnea. Weight loss needed  Code Status:     Code Status Orders        Start     Ordered   09/09/2016 1005  Full code  Continuous     09/01/2016 1007    Code Status History    Date Active Date Inactive Code Status Order ID Comments User Context   09/18/2016  8:30 AM 08/31/2016 10:08 AM Full Code [id number]  Loletha Grayer, MD ED     Family Communication: As per critical care specialist Disposition Plan: To be determined based on clinical course. Overall prognosis is poor. High likelihood for cardiac arrest  Consultants:  Critical care specialist  Cardiology  Neurology  Time spent: 25 minutes  [place], [place]

## 2016-09-08 NOTE — Progress Notes (Signed)
Patient had 17 beat run of VT. Last potassium for patient 4.5 at 1418 and magnesium this morning 2.4. Patient currently in afib with PVCs. Notified Dr. Belia HemanKasa who acknowledged and requested cardiology paged to see if they had any further input. Paged Dr. Gwen PoundsKowalski (cardiologist on call, awaiting callback). Team will continue to monitor.

## 2016-09-08 NOTE — Consult Note (Signed)
Reason for Consult: cardiac arrest  Referring Physician: Dr. Renae GlossWieting  CC: cardiac arrest   HPI: Isaiah [name] is an 51 y.o. male  with a known history of Hypertension diabetes and congestive heart failure. Pt is s/p fall and cardiac arrest with CPR x  3 rounds.  There was suspected shaking episode prior to arrest.  CTH no acute abnormalities.      Past Medical History:  Diagnosis Date  . CHF (congestive heart failure) (HCC)   . Diabetes mellitus without complication (HCC)   . Hypertension     Past Surgical History:  Procedure Laterality Date  . NO PAST SURGERIES      Family History  Problem Relation Age of Onset  . Hypertension Mother   . Hypertension Father   . Diabetes Father     Social History:  reports that he has never smoked. He has never used smokeless tobacco. He reports that he does not drink alcohol or use drugs.  No Known Allergies  Medications: I have reviewed the patient's current medications.  ROS: Unable to obtain   Physical Examination: Blood pressure 119/86, pulse (!) 106, temperature (!) 91.2 F (32.9 C), temperature source Core (Comment), resp. rate 16, height 6' (1.829 m), weight (!) 205.6 kg (453 lb 4.3 oz), SpO2 93 %.  Pt is sedated, paralyzed and not following commands.     Laboratory Studies:   Basic Metabolic Panel:  Recent Labs Lab 09/14/2016 1632 09/10/2016 2036 09/09/2016 2257 09/08/16 0152 09/08/16 0532 09/08/16 1110  NA 137  --  139 140 140 138  K 4.0  --  3.6 4.1 4.0 4.6  CL 100*  --  105 103 103 102  CO2 28  --  27 29 28 27   GLUCOSE 234*  --  158* 134* 124* 138*  BUN 32*  --  33* 36* 38* 39*  CREATININE 2.12*  --  2.16* 2.46* 2.67* 2.82*  CALCIUM 8.4*  --  7.8* 8.4* 8.4* 8.2*  MG  --  2.2  --  2.4  --   --   PHOS  --   --   --  4.2  --   --     Liver Function Tests:  Recent Labs Lab 09/11/2016 0750  AST 102*  ALT 60  ALKPHOS 93  BILITOT 1.4*  PROT 7.0  ALBUMIN 3.1*   No results for input(s): LIPASE, AMYLASE in the  last 168 hours. No results for input(s): AMMONIA in the last 168 hours.  CBC:  Recent Labs Lab 09/09/2016 0750 09/08/16 0152  WBC 15.4* 8.2  NEUTROABS 12.6*  --   HGB 13.2 13.3  HCT 40.7 41.3  MCV 80.8 80.5  PLT 257 178    Cardiac Enzymes:  Recent Labs Lab 09/10/2016 0750 09/11/2016 1208 09/05/2016 1632 09/08/16 0152 09/08/16 0532  TROPONINI 0.08* 0.44* 0.47* 0.21* 0.15*    BNP: Invalid input(s): POCBNP  CBG:  Recent Labs Lab 09/08/16 0500 09/08/16 0619 09/08/16 0721 09/08/16 0906 09/08/16 1111  GLUCAP 112* 72 79 125* 136*    Microbiology: Results for orders placed or performed during the hospital encounter of 09/13/2016  Culture, blood (routine x 2)     Status: None (Preliminary result)   Collection Time: 09/12/2016  8:18 AM  Result Value Ref Range Status   Specimen Description BLOOD L AC  Final   Special Requests   Final    BOTTLES DRAWN AEROBIC AND ANAEROBIC Blood Culture adequate volume   Culture NO GROWTH < 24 HOURS  Final   Report Status PENDING  Incomplete  MRSA PCR Screening     Status: None   Collection Time: 17-Sep-2016  9:46 AM  Result Value Ref Range Status   MRSA by PCR NEGATIVE NEGATIVE Final    Comment:        The GeneXpert MRSA Assay (FDA approved for NASAL specimens only), is one component of a comprehensive MRSA colonization surveillance program. It is not intended to diagnose MRSA infection nor to guide or monitor treatment for MRSA infections.   Culture, blood (routine x 2)     Status: None (Preliminary result)   Collection Time: 2016-09-17 11:45 AM  Result Value Ref Range Status   Specimen Description BLOOD L HAND  Final   Special Requests   Final    BOTTLES DRAWN AEROBIC ONLY Blood Culture results may not be optimal due to an inadequate volume of blood received in culture bottles   Culture NO GROWTH < 24 HOURS  Final   Report Status PENDING  Incomplete  Culture, respiratory (NON-Expectorated)     Status: None (Preliminary result)    Collection Time: 2016/09/17  6:32 PM  Result Value Ref Range Status   Specimen Description TRACHEAL ASPIRATE  Final   Special Requests NONE  Final   Gram Stain   Final    FEW WBC PRESENT, PREDOMINANTLY PMN MODERATE GRAM POSITIVE COCCI IN CHAINS IN PAIRS MODERATE GRAM NEGATIVE RODS Performed at [facility], [address] [place], [place] [address]    Culture PENDING  Incomplete   Report Status PENDING  Incomplete    Coagulation Studies:  Recent Labs  2016/09/17 0750 Sep 17, 2016 1208 09/17/2016 1830  LABPROT 16.6* 16.9* 17.2*  INR 1.33 1.36 1.39    Urinalysis: No results for input(s): COLORURINE, LABSPEC, PHURINE, GLUCOSEU, HGBUR, BILIRUBINUR, KETONESUR, PROTEINUR, UROBILINOGEN, NITRITE, LEUKOCYTESUR in the last 168 hours.  Invalid input(s): APPERANCEUR  Lipid Panel:  No results found for: CHOL, TRIG, HDL, CHOLHDL, VLDL, LDLCALC  HgbA1C:  Lab Results  Component Value Date   HGBA1C 6.2 (H) 2016/09/17    Urine Drug Screen:      Component Value Date/Time   LABOPIA NONE DETECTED 09/17/16 1440   COCAINSCRNUR NONE DETECTED September 17, 2016 1440   LABBENZ POSITIVE (A) 09-17-2016 1440   AMPHETMU NONE DETECTED 09/17/16 1440   THCU NONE DETECTED 17-Sep-2016 1440   LABBARB NONE DETECTED 2016-09-17 1440    Alcohol Level:   Recent Labs Lab 09/17/16 0750  ETH <5    Other results:   Imaging: Dg Chest 1 View  Result Date: 09/08/2016 CLINICAL DATA:  51 year old male with dyspnea. Subsequent encounter. EXAM: CHEST 1 VIEW COMPARISON:  09/17/2016 FINDINGS: Left central line in the atypical position. On recent chest CT, this appears outside the left brachycephalic vein coursing over the aortic arch. This needs to be removed. Endotracheal tube tip 3.2 cm above the carina. Cardiomegaly. Suspect mild pulmonary edema without significant change. IMPRESSION: Left central line in the atypical position. On recent chest CT, this appears outside the left brachycephalic vein coursing over  the aortic arch. This needs to be removed. Cardiomegaly. Suspect mild pulmonary edema without significant change. These results were called by telephone at the time of interpretation on 09/08/2016 at 7:04 am to Adron Bene, RN who verbally acknowledged these results. Electronically Signed   By: Lacy Duverney M.D.   On: 09/08/2016 07:12   Ct Head Wo Contrast  Result Date: 09-17-16 CLINICAL DATA:  Cardiac arrest. EXAM: CT HEAD WITHOUT CONTRAST TECHNIQUE: Contiguous axial  images were obtained from the base of the skull through the vertex without intravenous contrast. COMPARISON:  None. FINDINGS: Brain: No evidence of acute infarction, hemorrhage, hydrocephalus, extra-axial collection or mass lesion/mass effect. Vascular: No hyperdense vessel or unexpected calcification. Skull: Normal. Negative for fracture or focal lesion. Sinuses/Orbits: Scattered areas of mucosal disease in the maxillary sinuses. Minimal sinus disease in the sphenoid sinuses. Other: None. IMPRESSION: No acute intracranial abnormality. Paranasal sinus disease. Electronically Signed   By: Richarda Overlie M.D.   On: 09/01/2016 09:43   Ct Angio Chest Pe W/cm &/or Wo Cm  Result Date: 09/05/2016 CLINICAL DATA:  Cardiac arrest. EXAM: CT ANGIOGRAPHY CHEST WITH CONTRAST TECHNIQUE: Multidetector CT imaging of the chest was performed using the standard protocol during bolus administration of intravenous contrast. Multiplanar CT image reconstructions and MIPs were obtained to evaluate the vascular anatomy. CONTRAST:  75 cc of Isovue 370 COMPARISON:  None FINDINGS: Cardiovascular: The heart size is enlarged. There does not appear to be evidence of right heart strain. No definite evidence for embolus within the main pulmonary artery or the lobar branches. Examination is nondiagnostic for the segmental, and subsegmental branches. Mediastinum/Nodes: ET tube tip is above the carina. The nasogastric tube with tip in the stomach. Study is very difficult to  determine whether not there are any enlarged mediastinal or hilar lymph nodes. No axillary or supraclavicular adenopathy. Lungs/Pleura: No pleural effusion. Bilateral upper and lower lobe posterior predominant atelectasis and subpleural consolidation. Upper Abdomen: No acute abnormality. Musculoskeletal: There is a fracture through the mid body of the sternum, image 54 of series 9. Fracture involving the posterior aspect of the left third rib noted. Review of the MIP images confirms the above findings. IMPRESSION: 1. Markedly diminished exam detail secondary to patient's body habitus. Patient reportedly weighs greater than 500 pounds. 2. No evidence for pulmonary embolus in the main pulmonary artery or the lobar branches. The exam is nondiagnostic for segmental and subsegmental branches. 3. Sternal fracture.  Left posterior third rib fracture. 4. Bilateral posterior atelectasis and subpleural consolidation. Electronically Signed   By: Signa Kell M.D.   On: 09/16/2016 09:57   Dg Chest Port 1 View  Result Date: 09/08/2016 CLINICAL DATA:  Central line placement EXAM: PORTABLE CHEST 1 VIEW COMPARISON:  09/08/2016 FINDINGS: Endotracheal tube with the tip 5.6 cm above the carina. Nasogastric tube coursing below the diaphragm. Left central venous catheter with the tip projecting over the proximal jugular vein, but is not well delineated secondary to technique. Mild bilateral diffuse interstitial thickening. No pleural effusion or pneumothorax. Stable cardiomegaly. IMPRESSION: 1. Endotracheal tube with the tip 5.6 cm above the carina. 2. Left central venous catheter with the tip projecting over the proximal jugular vein, but is not well delineated secondary to technique. 3. Cardiomegaly with mild pulmonary vascular congestion. Electronically Signed   By: Elige Ko   On: 09/08/2016 09:50   Dg Chest Port 1 View  Result Date: 09/06/2016 CLINICAL DATA:  Line placement after cardiac arrest today. EXAM: PORTABLE  CHEST 1 VIEW COMPARISON:  CT chest and chest radiograph 09/21/2016. FINDINGS: Endotracheal tube terminates 4.6 cm above the carina. Left IJ central line tip is poorly visualized due to body habitus. It is followed into the left brachiocephalic region but is difficult to follow any further. Nasogastric tube is followed into the distal esophagus but again, is difficult to follow further due to body habitus. Defibrillator pad is seen over the left chest. Heart is enlarged. Lungs are somewhat low in volume  with mild interstitial prominence and indistinctness. No definite pleural fluid. No pneumothorax. IMPRESSION: 1. Body habitus is limiting and degrades image quality. 2. Left IJ central line tip is followed into the left brachiocephalic vein but is difficult to see beyond that due to body habitus. 3. Suspect mild pulmonary edema. Electronically Signed   By: Leanna Battles M.D.   On: 2016-09-24 11:31   Dg Chest Port 1 View  Result Date: 2016/09/24 CLINICAL DATA:  Hypoxia.  Status post cardiac arrest EXAM: PORTABLE CHEST 1 VIEW COMPARISON:  None. FINDINGS: Endotracheal tube tip is 3.5 cm above the carina. Nasogastric to tip and side port are below the diaphragm. No pneumothorax. There is cardiomegaly with pulmonary venous hypertension. There is slight interstitial edema. There is no appreciable airspace consolidation. No adenopathy evident. No bone lesions. IMPRESSION: Tube positions as described without pneumothorax. Evidence a degree of congestive heart failure without consolidation or volume loss. Electronically Signed   By: Bretta Bang III M.D.   On: September 24, 2016 08:33     Assessment/Plan:  51 y.o. male  with a known history of Hypertension diabetes and congestive heart failure. Pt is s/p fall and cardiac arrest with CPR x 3 rounds.  There was suspected shaking episode prior to arrest.  CTH no acute abnormalities.   No change in examination - EEG yesterday no seizure activity - Will be warmed  tomorrow morning - Would obtain more imaging. Would like MRI but pt is on multiple drips might be difficult. If unable to obtain would like to see CTH without contrast.     09/08/2016, 1:05 PM

## 2016-09-08 NOTE — Progress Notes (Signed)
ANTICOAGULATION CONSULT NOTE   Pharmacy Consult for heparin drip management  Indication: elevated tropinins   Pharmacy consulted for heparin drip management for 51 yo male admitted s/p vfib arrest. Patient had CPR x 3 rounds. Patient intubated and sedated; currently undergoing targeted temperature management. Head CT is negative. Patient takes apixaban as an outpatient currently has elevated anti-Xa level.   Goal:  APTT: 68-109 anti-Xa: 0.3-0.7 Monitor platelets per protocol   Plan:  Last known dose of apixaban is not known. Will initiate patient on heparin 1800 units/hr with no bolus. Will manage patient with aPTTs until anti-Xa and aPTT levels correlate. Will obtain next aPTT level at 2000.   APTT (SEC) BOLUS DOSE STOP INFUSION (MINUTES) RATE CHANGE (mL/HR) REPEAT APTT  < 41 3000 units 0 + 1 ml/HR 6 HR  41 - 67 0 0 + 1 ml/HR 6 HR  68 - 109 0 0 NO CHANGE NEXT AM  110 - 136 0 0 - 1 ml/HR 6 HR  137 - 160 0 30 MIN - 1 ml/HR 6 HR  >160 0 60 MIN - 2 ml/HR 6 HR    Not on File  Patient Measurements: Height: 6' (182.9 cm) Weight: (!) 453 lb 4.3 oz (205.6 kg) IBW/kg (Calculated) : 77.6 Heparin Dosing Weight: 129kg  Vital Signs: Temp: 92.3 F (33.5 C) (06/12 0359) Temp Source: Other (Comment) (06/11 2000) BP: 121/94 (06/12 0400) Pulse Rate: 79 (06/12 0400)  Labs:  Recent Labs  12/27/16 0750 12/27/16 1208  12/27/16 1632 12/27/16 1830 12/27/16 2257 09/08/16 0152 09/08/16 0158  HGB 13.2  --   --   --   --   --  13.3  --   HCT 40.7  --   --   --   --   --  41.3  --   PLT 257  --   --   --   --   --  178  --   APTT  --  29  --   --  85*  --   --  139*  LABPROT 16.6* 16.9*  --   --  17.2*  --   --   --   INR 1.33 1.36  --   --  1.39  --   --   --   HEPARINUNFRC  --  2.42*  --   --   --   --  2.72*  --   CREATININE 1.68* 1.96*  < > 2.12*  --  2.16* 2.46*  --   TROPONINI 0.08* 0.44*  --  0.47*  --   --  0.21*  --   < > = values in this interval not displayed.  Estimated  Creatinine Clearance: 66.9 mL/min (A) (by C-G formula based on SCr of 2.46 mg/dL (H)).   Pharmacy will continue to monitor and adjust per consult.   6/12 02:00 aPTT 139, anti-Xa 2.72. Hold infusion x 30 minutes and restart at 1700 units/hr. Recheck aPTT 6 hours after restart.  Caesar Mannella S 09/08/2016,4:07 AM

## 2016-09-08 NOTE — Progress Notes (Signed)
Pt's sao2 88% on +10 Peep and 100%. Increased Peep to +12 sao2 90-92%.

## 2016-09-08 NOTE — Progress Notes (Signed)
MD did pull out central line after review of images again and get new x-ray confirmation of placement. Dr. Nicholos Johnsamachandran reviewed x-ray at bedside and after radiology report was finalized, gave order that RN could use line again now. RN had not restarted infusions before reposition. Will change lines and restart infusions in central line now and get lab values.

## 2016-09-08 NOTE — Progress Notes (Signed)
ANTICOAGULATION CONSULT NOTE   Pharmacy Consult for heparin drip management  Indication: elevated tropinins   Pharmacy consulted for heparin drip management for 51 yo male admitted s/p vfib arrest. Patient had CPR x 3 rounds. Patient intubated and sedated; currently undergoing targeted temperature management. Head CT is negative. Patient takes apixaban as an outpatient currently has elevated anti-Xa level. Patient's heparin infusing at 1700 units/hr.   Goal:  APTT: 68-109 anti-Xa: 0.3-0.7 Monitor platelets per protocol   Plan:  Will hold heparin x 1 hour and continue patient on heparin 1500 units/hr. Will manage patient with aPTTs until anti-Xa and aPTT levels correlate. Will obtain next aPTT level at 2300.   APTT (SEC) BOLUS DOSE STOP INFUSION (MINUTES) RATE CHANGE (mL/HR) REPEAT APTT  < 41 3000 units 0 + 1 ml/HR 6 HR  41 - 67 0 0 + 1 ml/HR 6 HR  68 - 109 0 0 NO CHANGE NEXT AM  110 - 136 0 0 - 1 ml/HR 6 HR  137 - 160 0 30 MIN - 1 ml/HR 6 HR  >160 0 60 MIN - 2 ml/HR 6 HR    No Known Allergies  Patient Measurements: Height: 6' (182.9 cm) Weight: (!) 458 lb (207.7 kg) IBW/kg (Calculated) : 77.6 Heparin Dosing Weight: 129kg  Vital Signs: Temp: 90.7 F (32.6 C) (06/12 1600) Temp Source: Core (Comment) (06/12 1600) BP: 85/73 (06/12 1600) Pulse Rate: 117 (06/12 1600)  Labs:  Recent Labs  09/12/2016 0750 09/11/2016 1208  09/20/2016 1632 09/06/2016 1830  09/08/16 0152 09/08/16 0158 09/08/16 0532 09/08/16 1110 09/08/16 1418  HGB 13.2  --   --   --   --   --  13.3  --   --   --   --   HCT 40.7  --   --   --   --   --  41.3  --   --   --   --   PLT 257  --   --   --   --   --  178  --   --   --   --   APTT  --  29  --   --  85*  --   --  139*  --  >160*  --   LABPROT 16.6* 16.9*  --   --  17.2*  --   --   --   --   --   --   INR 1.33 1.36  --   --  1.39  --   --   --   --   --   --   HEPARINUNFRC  --  2.42*  --   --   --   --  2.72*  --   --   --   --   CREATININE 1.68* 1.96*   < > 2.12*  --   < > 2.46*  --  2.67* 2.82* 3.05*  TROPONINI 0.08* 0.44*  --  0.47*  --   --  0.21*  --  0.15*  --   --   < > = values in this interval not displayed.  Estimated Creatinine Clearance: 54.3 mL/min (A) (by C-G formula based on SCr of 3.05 mg/dL (H)).   Pharmacy will continue to monitor and adjust per consult.   Sussan Meter L 09/08/2016,4:59 PM

## 2016-09-08 NOTE — Plan of Care (Signed)
Problem: Physical Regulation: Goal: Ability to maintain clinical measurements within normal limits will improve Outcome: Progressing Patient started on amiodarone infusion per cardiology orders after 17 beat run of VT. No VT noted since infusion started, hear rate improved from 100-120s to 80-100s after infusion started.  Problem: Tissue Perfusion: Goal: Risk factors for ineffective tissue perfusion will decrease Outcome: Progressing Heparin infusion adjusted per pharmacy orders. Patient had elevated aPTT results in afternoon (greater than 160).  Problem: Fluid Volume: Goal: Ability to maintain a balanced intake and output will improve Outcome: Not Progressing Patient with low urine output during shift. Dr. Nicholos Johnsamachandran aware after rounds, started patient on 60 mg lasix IV every 8 hours and had spoken to family in person about possibility of dialysis in the morning.  Problem: Nutrition: Goal: Adequate nutrition will be maintained Outcome: Progressing Tube feedings started today.

## 2016-09-08 NOTE — Progress Notes (Signed)
  Amiodarone Drug - Drug Interaction Consult Note  Recommendations:  Concurrent use of AMIODARONE and CALCIUM CHANNEL BLOCKERS may result in bradycardia, atrioventricular block and/ or sinus arrest. No other drug interactions are present at this time.     Amiodarone is metabolized by the cytochrome P450 system and therefore has the potential to cause many drug interactions. Amiodarone has an average plasma half-life of 50 days (range 20 to 100 days).   There is potential for drug interactions to occur several weeks or months after stopping treatment and the onset of drug interactions may be slow after initiating amiodarone.  Review of outpatient medications for drug interactions:   [x]  Beta blockers: increased risk of bradycardia, AV block and myocardial depression.   [x]   Calcium channel blockers (diltiazem and verapamil): increased risk of bradycardia, AV block and myocardial depression..  [x]  Diuretics: increased risk of cardiotoxicity if hypokalemia occurs.  [x]  Oral hypoglycemic agents (glyburide, glipizide, glimepiride): increased risk of hypoglycemia. Patient's glucose levels should be monitored closely when initiating amiodarone therapy.    Thank You,  Gardner CandleSheema M Ryian Lynde, PharmD, BCPS Clinical Pharmacist 09/08/2016 4:23 PM

## 2016-09-08 NOTE — Progress Notes (Signed)
Nutrition Follow-up  DOCUMENTATION CODES:   Morbid obesity  INTERVENTION:  1. recommend Vital HP- Initiate via OGT at 2340ml/hr and increase by 3310ml/hr every 8 hours until goal rate of 8480ml/hr is reached.  NUTRITION DIAGNOSIS:   Inadequate oral intake related to inability to eat (sedated on vent) as evidenced by NPO status. -ongoing  GOAL:   Provide needs based on ASPEN/SCCM guidelines -not meeting  MONITOR:   Vent status, Labs, Weight trends, I & O's  ASSESSMENT:   51 y/o morbidly obese male with h/o DM, CHF, admitted for cardiac arrest of unclear etiology.  Patient is currently intubated on ventilator support MV: 8.1 L/min Temp (24hrs), Avg:93.4 F (34.1 C), Min:90.9 F (32.7 C), Max:98.2 F (36.8 C) Propofol: none Discussed in Rounds  Intake/Output Summary (Last 24 hours) at 09/08/16 1107 Last data filed at 09/08/16 0900  Gross per 24 hour  Intake          1128.81 ml  Output              232 ml  Net           896.81 ml  Labs and medications reviewed: NS @ 6910mL/hr, Nimbex gtt, epi gtt, Fentanyl gtt, Heparin gtt, Versed gtt, Levo gtt TBili 1.4, BNP elevated, Troponin elevated  Diet Order:  Diet NPO time specified  Skin:  Reviewed, no issues  Last BM:  none since admit   Height:   Ht Readings from Last 1 Encounters:  09/04/2016 6' (1.829 m)    Weight:   Wt Readings from Last 1 Encounters:  09/01/2016 (!) 453 lb 4.3 oz (205.6 kg)    Ideal Body Weight:  80.9 kg  BMI:  Body mass index is 61.47 kg/m.  Estimated Nutritional Needs:   Kcal:  1800-2000kcal/day (22-25kcal/day IBW)  Protein:  >200g/day   Fluid:  541ml/kcal/day  EDUCATION NEEDS:   Education needs no appropriate at this time  Dionne AnoWilliam M. Shakil Dirk, MS, RD LDN Inpatient Clinical Dietitian Pager [phone]

## 2016-09-08 NOTE — Progress Notes (Signed)
[facility] Encounter Note  Patient: Isaiah [name] / Admit Date: 09/10/2016 / Date of Encounter: 09/08/2016, 1:16 PM   Subjective: Patient appears to be hemodynamically stable today with sinus tachycardia and no other concerns of rhythm disturbances. No apparent seizure activity on EEG. Some heart failure and pulmonary edema significantly improved at this time with the diuresis. No hypoxia at this time Echocardiogram shows severe dilation of left ventricle with severe global LV systolic dysfunction with ejection fraction of 10-15% Review of Systems:  Cannot be assessed at this time Objective: Telemetry: Sinus tachycardia Physical Exam: Blood pressure 108/78, pulse 88, temperature (!) 90.7 F (32.6 C), temperature source Core (Comment), resp. rate 16, height 6' (1.829 m), weight (!) 205.6 kg (453 lb 4.3 oz), SpO2 93 %. Body mass index is 61.47 kg/m. General: Well developed, well nourished, intubated. Head: Normocephalic, atraumatic, sclera non-icteric, no xanthomas, nares are without discharge. Neck: No apparent masses Lungs: Normal respirations with diffuse wheezes, no rhonchi, no rales , basilar crackles   Heart: Regular rate and rhythm, normal S1 S2, no murmur, no rub, no gallop, PMI is normal size and placement, carotid upstroke normal without bruit, jugular venous pressure normal Abdomen: Soft,  non-distended with normoactive bowel sounds. No hepatosplenomegaly. Abdominal aorta is normal size without bruit Extremities: 2+ edema, no clubbing, no cyanosis, no ulcers,  Peripheral: 2+ radial, 2+ femoral, 2+ dorsal pedal pulses Neuro: Not Alert and oriented. Does not Moves all extremities spontaneously. Psych:  Does not Responds to questions appropriately with a normal affect.   Intake/Output Summary (Last 24 hours) at 09/08/16 1316 Last data filed at 09/08/16 1300  Gross per 24 hour  Intake          1073.16 ml  Output              239 ml  Net           834.16 ml     Inpatient Medications:  . budesonide (PULMICORT) nebulizer solution  0.5 mg Nebulization BID  . chlorhexidine gluconate (MEDLINE KIT)  15 mL Mouth Rinse BID  . diltiazem  60 mg Oral Q8H  . feeding supplement (VITAL HIGH PROTEIN)  1,000 mL Per Tube Q24H  . furosemide  60 mg Intravenous Q8H  . insulin aspart  2-6 Units Subcutaneous Q4H  . [START ON 09/09/2016] insulin glargine  10 Units Subcutaneous Q24H  . ipratropium-albuterol  3 mL Nebulization Q6H  . mouth rinse  15 mL Mouth Rinse 10 times per day  . pantoprazole (PROTONIX) IV  40 mg Intravenous Q24H  . sodium chloride flush  10-40 mL Intracatheter Q12H   Infusions:  . sodium chloride 10 mL/hr at 09/08/16 0700  . cisatracurium (NIMBEX) infusion 1 mcg/kg/min (09/08/16 0700)  . epinephrine    . fentaNYL infusion INTRAVENOUS 125 mcg/hr (09/08/16 0715)  . heparin 1,700 Units/hr (09/08/16 0700)  . midazolam (VERSED) infusion 2 mg/hr (09/08/16 0700)  . norepinephrine (LEVOPHED) Adult infusion      Labs:  Recent Labs  09/18/2016 2036  09/08/16 0152 09/08/16 0532 09/08/16 1110  NA  --   < > 140 140 138  K  --   < > 4.1 4.0 4.6  CL  --   < > 103 103 102  CO2  --   < > 29 28 27   GLUCOSE  --   < > 134* 124* 138*  BUN  --   < > 36* 38* 39*  CREATININE  --   < > 2.46*  2.67* 2.82*  CALCIUM  --   < > 8.4* 8.4* 8.2*  MG 2.2  --  2.4  --   --   PHOS  --   --  4.2  --   --   < > = values in this interval not displayed.  Recent Labs  09/25/2016 0750  AST 102*  ALT 60  ALKPHOS 93  BILITOT 1.4*  PROT 7.0  ALBUMIN 3.1*    Recent Labs  09/04/2016 0750 09/08/16 0152  WBC 15.4* 8.2  NEUTROABS 12.6*  --   HGB 13.2 13.3  HCT 40.7 41.3  MCV 80.8 80.5  PLT 257 178    Recent Labs  09/24/2016 1208 09/04/2016 1632 09/08/16 0152 09/08/16 0532  TROPONINI 0.44* 0.47* 0.21* 0.15*   Invalid input(s): POCBNP  Recent Labs  08/31/2016 1210  HGBA1C 6.2*     Weights: Filed Weights   09/04/2016 0954  Weight: (!) 205.6 kg (453 lb  4.3 oz)     Radiology/Studies:  Dg Chest 1 View  Result Date: 09/08/2016 CLINICAL DATA:  51 year old male with dyspnea. Subsequent encounter. EXAM: CHEST 1 VIEW COMPARISON:  09/18/2016 FINDINGS: Left central line in the atypical position. On recent chest CT, this appears outside the left brachycephalic vein coursing over the aortic arch. This needs to be removed. Endotracheal tube tip 3.2 cm above the carina. Cardiomegaly. Suspect mild pulmonary edema without significant change. IMPRESSION: Left central line in the atypical position. On recent chest CT, this appears outside the left brachycephalic vein coursing over the aortic arch. This needs to be removed. Cardiomegaly. Suspect mild pulmonary edema without significant change. These results were called by telephone at the time of interpretation on 09/08/2016 at 7:04 am to Adalberto Ill, RN who verbally acknowledged these results. Electronically Signed   By: Genia Del M.D.   On: 09/08/2016 07:12   Ct Head Wo Contrast  Result Date: 08/31/2016 CLINICAL DATA:  Cardiac arrest. EXAM: CT HEAD WITHOUT CONTRAST TECHNIQUE: Contiguous axial images were obtained from the base of the skull through the vertex without intravenous contrast. COMPARISON:  None. FINDINGS: Brain: No evidence of acute infarction, hemorrhage, hydrocephalus, extra-axial collection or mass lesion/mass effect. Vascular: No hyperdense vessel or unexpected calcification. Skull: Normal. Negative for fracture or focal lesion. Sinuses/Orbits: Scattered areas of mucosal disease in the maxillary sinuses. Minimal sinus disease in the sphenoid sinuses. Other: None. IMPRESSION: No acute intracranial abnormality. Paranasal sinus disease. Electronically Signed   By: Markus Daft M.D.   On: 09/09/2016 09:43   Ct Angio Chest Pe W/cm &/or Wo Cm  Result Date: 08/31/2016 CLINICAL DATA:  Cardiac arrest. EXAM: CT ANGIOGRAPHY CHEST WITH CONTRAST TECHNIQUE: Multidetector CT imaging of the chest was performed  using the standard protocol during bolus administration of intravenous contrast. Multiplanar CT image reconstructions and MIPs were obtained to evaluate the vascular anatomy. CONTRAST:  75 cc of Isovue 370 COMPARISON:  None FINDINGS: Cardiovascular: The heart size is enlarged. There does not appear to be evidence of right heart strain. No definite evidence for embolus within the main pulmonary artery or the lobar branches. Examination is nondiagnostic for the segmental, and subsegmental branches. Mediastinum/Nodes: ET tube tip is above the carina. The nasogastric tube with tip in the stomach. Study is very difficult to determine whether not there are any enlarged mediastinal or hilar lymph nodes. No axillary or supraclavicular adenopathy. Lungs/Pleura: No pleural effusion. Bilateral upper and lower lobe posterior predominant atelectasis and subpleural consolidation. Upper Abdomen: No acute abnormality. Musculoskeletal: There is a  fracture through the mid body of the sternum, image 54 of series 9. Fracture involving the posterior aspect of the left third rib noted. Review of the MIP images confirms the above findings. IMPRESSION: 1. Markedly diminished exam detail secondary to patient's body habitus. Patient reportedly weighs greater than 500 pounds. 2. No evidence for pulmonary embolus in the main pulmonary artery or the lobar branches. The exam is nondiagnostic for segmental and subsegmental branches. 3. Sternal fracture.  Left posterior third rib fracture. 4. Bilateral posterior atelectasis and subpleural consolidation. Electronically Signed   By: Kerby Moors M.D.   On: 09/24/2016 09:57   Dg Chest Port 1 View  Result Date: 09/08/2016 CLINICAL DATA:  Central line placement EXAM: PORTABLE CHEST 1 VIEW COMPARISON:  09/08/2016 FINDINGS: Endotracheal tube with the tip 5.6 cm above the carina. Nasogastric tube coursing below the diaphragm. Left central venous catheter with the tip projecting over the proximal  jugular vein, but is not well delineated secondary to technique. Mild bilateral diffuse interstitial thickening. No pleural effusion or pneumothorax. Stable cardiomegaly. IMPRESSION: 1. Endotracheal tube with the tip 5.6 cm above the carina. 2. Left central venous catheter with the tip projecting over the proximal jugular vein, but is not well delineated secondary to technique. 3. Cardiomegaly with mild pulmonary vascular congestion. Electronically Signed   By: Kathreen Devoid   On: 09/08/2016 09:50   Dg Chest Port 1 View  Result Date: 09/18/2016 CLINICAL DATA:  Line placement after cardiac arrest today. EXAM: PORTABLE CHEST 1 VIEW COMPARISON:  CT chest and chest radiograph 09/16/2016. FINDINGS: Endotracheal tube terminates 4.6 cm above the carina. Left IJ central line tip is poorly visualized due to body habitus. It is followed into the left brachiocephalic region but is difficult to follow any further. Nasogastric tube is followed into the distal esophagus but again, is difficult to follow further due to body habitus. Defibrillator pad is seen over the left chest. Heart is enlarged. Lungs are somewhat low in volume with mild interstitial prominence and indistinctness. No definite pleural fluid. No pneumothorax. IMPRESSION: 1. Body habitus is limiting and degrades image quality. 2. Left IJ central line tip is followed into the left brachiocephalic vein but is difficult to see beyond that due to body habitus. 3. Suspect mild pulmonary edema. Electronically Signed   By: Lorin Picket M.D.   On: 09/10/2016 11:31   Dg Chest Port 1 View  Result Date: 09/17/2016 CLINICAL DATA:  Hypoxia.  Status post cardiac arrest EXAM: PORTABLE CHEST 1 VIEW COMPARISON:  None. FINDINGS: Endotracheal tube tip is 3.5 cm above the carina. Nasogastric to tip and side port are below the diaphragm. No pneumothorax. There is cardiomegaly with pulmonary venous hypertension. There is slight interstitial edema. There is no appreciable  airspace consolidation. No adenopathy evident. No bone lesions. IMPRESSION: Tube positions as described without pneumothorax. Evidence a degree of congestive heart failure without consolidation or volume loss. Electronically Signed   By: Lowella Grip III M.D.   On: 08/30/2016 08:33     Assessment and Recommendation  51 y.o. male with known diabetes with complication essential hypertension mixed hyperlipidemia and chronic nonvalvular atrial fibrillation with obesity and severe sleep apnea with the cardiac arrest and no current evidence of the acute coronary syndrome due to no significant EKG changes or elevation of troponin. Echocardiogram suggested patient has had sleep apnea atrial fibrillation as a primary cause of cardiomyopathy likely causing her rhythm disturbance and heart failure with acute arrest 1. Continue supportive care with oxygenation  and pressure support 2. Heart rate control with beta blocker if able if patient has enough pressure support 3. Furosemide for pulmonary edema 4. Begin warming and recovering patient from arrest and following for improvements of symptoms with further cardiovascular workup thereafter if neurologically viable 5. No further cardiovascular diagnostics at this time until above  Signed, Serafina Royals M.D. FACC

## 2016-09-08 NOTE — Progress Notes (Signed)
Spoke with Dr. Gwen PoundsKowalski on the phone about patient's run of VT and current condition including lab values. MD ordered amiodarone IV infusion with bolus after reviewing patient's condition. Team will continue to monitor.

## 2016-09-08 NOTE — Progress Notes (Signed)
Spoke with Dr. Nicholos Johnsamachandran about radiologist's report on morning chest x-ray. MD reviewed image and report personally (including x-rays and CT). Patient's vital signs stable, lungs sounds assessed and compared to prior results by MD and RN, central line has excellent blood return, and patient did not have central line placed until after the CT (CT on the way to ICU from ED, central line placed in ICU). MD ordered that central line could still be used. RN will change to new tubing and restart infusions with central line. Team will continue to monitor.

## 2016-09-08 NOTE — Progress Notes (Signed)
ANTICOAGULATION CONSULT NOTE   Pharmacy Consult for heparin drip management  Indication: elevated tropinins   Pharmacy consulted for heparin drip management for 51 yo male admitted s/p vfib arrest. Patient had CPR x 3 rounds. Patient intubated and sedated; currently undergoing targeted temperature management. Head CT is negative. Patient takes apixaban as an outpatient currently has elevated anti-Xa level. Patient's heparin infusing at 1700 units/hr.   Goal:  APTT: 68-109 anti-Xa: 0.3-0.7 Monitor platelets per protocol   Plan:  Will hold heparin x 1 hour and continue patient on heparin 1500 units/hr. Will manage patient with aPTTs until anti-Xa and aPTT levels correlate. Will obtain next aPTT level at 2300.    6/12 2300 aPTT >160. Hold drip x 1 hour and restart at 1300 units/hr. Recheck aPTT in 6 hours.   APTT (SEC) BOLUS DOSE STOP INFUSION (MINUTES) RATE CHANGE (mL/HR) REPEAT APTT  < 41 3000 units 0 + 1 ml/HR 6 HR  41 - 67 0 0 + 1 ml/HR 6 HR  68 - 109 0 0 NO CHANGE NEXT AM  110 - 136 0 0 - 1 ml/HR 6 HR  137 - 160 0 30 MIN - 1 ml/HR 6 HR  >160 0 60 MIN - 2 ml/HR 6 HR    No Known Allergies  Patient Measurements: Height: 6' (182.9 cm) Weight: (!) 458 lb (207.7 kg) IBW/kg (Calculated) : 77.6 Heparin Dosing Weight: 129kg  Vital Signs: Temp: 93.4 F (34.1 C) (06/12 2200) Temp Source: Core (Comment) (06/12 2200) BP: 97/75 (06/12 2215) Pulse Rate: 88 (06/12 2200)  Labs:  Recent Labs  Aug 01, 2016 0750 Aug 01, 2016 1208  Aug 01, 2016 1632 Aug 01, 2016 1830  09/08/16 0152 09/08/16 0158 09/08/16 0532 09/08/16 1110 09/08/16 1418 09/08/16 1800 09/08/16 2227  HGB 13.2  --   --   --   --   --  13.3  --   --   --   --   --   --   HCT 40.7  --   --   --   --   --  41.3  --   --   --   --   --   --   PLT 257  --   --   --   --   --  178  --   --   --   --   --   --   APTT  --  29  --   --  85*  --   --  139*  --  >160*  --   --  >160*  LABPROT 16.6* 16.9*  --   --  17.2*  --   --   --    --   --   --   --   --   INR 1.33 1.36  --   --  1.39  --   --   --   --   --   --   --   --   HEPARINUNFRC  --  2.42*  --   --   --   --  2.72*  --   --   --   --   --   --   CREATININE 1.68* 1.96*  < > 2.12*  --   < > 2.46*  --  2.67* 2.82* 3.05* 2.94* 3.05*  TROPONINI 0.08* 0.44*  --  0.47*  --   --  0.21*  --  0.15*  --   --   --   --   < > =  values in this interval not displayed.  Estimated Creatinine Clearance: 54.3 mL/min (A) (by C-G formula based on SCr of 3.05 mg/dL (H)).   Pharmacy will continue to monitor and adjust per consult.   Calden Dorsey S 09/08/2016,11:39 PM

## 2016-09-08 NOTE — Progress Notes (Signed)
Per pharmacy, hold heparin drip for one hour due to APTT >160.  Pharmacy will place further orders.

## 2016-09-09 ENCOUNTER — Inpatient Hospital Stay: Payer: BLUE CROSS/BLUE SHIELD

## 2016-09-09 ENCOUNTER — Inpatient Hospital Stay (HOSPITAL_COMMUNITY): Payer: BLUE CROSS/BLUE SHIELD

## 2016-09-09 DIAGNOSIS — I469 Cardiac arrest, cause unspecified: Secondary | ICD-10-CM

## 2016-09-09 DIAGNOSIS — L8990 Pressure ulcer of unspecified site, unspecified stage: Secondary | ICD-10-CM | POA: Insufficient documentation

## 2016-09-09 LAB — BLOOD GAS, ARTERIAL
ACID-BASE DEFICIT: 1.1 mmol/L (ref 0.0–2.0)
Acid-base deficit: 1.2 mmol/L (ref 0.0–2.0)
Bicarbonate: 26.4 mmol/L (ref 20.0–28.0)
Bicarbonate: 27.9 mmol/L (ref 20.0–28.0)
FIO2: 1
FIO2: 0.45
MECHANICAL RATE: 16
Mechanical Rate: 16
O2 SAT: 87.8 %
O2 Saturation: 99.4 %
PATIENT TEMPERATURE: 37
PCO2 ART: 65 mmHg — AB (ref 32.0–48.0)
PEEP: 12 cmH2O
PEEP: 10 cmH2O
PH ART: 7.24 — AB (ref 7.350–7.450)
Patient temperature: 37
VT: 500 mL
VT: 500 mL
pCO2 arterial: 55 mmHg — ABNORMAL HIGH (ref 32.0–48.0)
pH, Arterial: 7.29 — ABNORMAL LOW (ref 7.350–7.450)
pO2, Arterial: 168 mmHg — ABNORMAL HIGH (ref 83.0–108.0)
pO2, Arterial: 64 mmHg — ABNORMAL LOW (ref 83.0–108.0)

## 2016-09-09 LAB — RENAL FUNCTION PANEL
ALBUMIN: 3.2 g/dL — AB (ref 3.5–5.0)
Albumin: 3.3 g/dL — ABNORMAL LOW (ref 3.5–5.0)
Anion gap: 13 (ref 5–15)
Anion gap: 12 (ref 5–15)
BUN: 53 mg/dL — AB (ref 6–20)
BUN: 58 mg/dL — AB (ref 6–20)
CALCIUM: 7.8 mg/dL — AB (ref 8.9–10.3)
CHLORIDE: 98 mmol/L — AB (ref 101–111)
CO2: 27 mmol/L (ref 22–32)
CO2: 25 mmol/L (ref 22–32)
CREATININE: 3.85 mg/dL — AB (ref 0.61–1.24)
Calcium: 7.9 mg/dL — ABNORMAL LOW (ref 8.9–10.3)
Chloride: 101 mmol/L (ref 101–111)
Creatinine, Ser: 4.16 mg/dL — ABNORMAL HIGH (ref 0.61–1.24)
GFR calc Af Amer: 18 mL/min — ABNORMAL LOW (ref >60)
GFR calc Af Amer: 20 mL/min — ABNORMAL LOW (ref >60)
GFR, EST NON AFRICAN AMERICAN: 16 mL/min — AB (ref >60)
GFR, EST NON AFRICAN AMERICAN: 17 mL/min — AB (ref >60)
Glucose, Bld: 149 mg/dL — ABNORMAL HIGH (ref 65–99)
Glucose, Bld: 145 mg/dL — ABNORMAL HIGH (ref 65–99)
PHOSPHORUS: 7.4 mg/dL — AB (ref 2.5–4.6)
POTASSIUM: 4.6 mmol/L (ref 3.5–5.1)
POTASSIUM: 4 mmol/L (ref 3.5–5.1)
Phosphorus: 8.2 mg/dL — ABNORMAL HIGH (ref 2.5–4.6)
Sodium: 138 mmol/L (ref 135–145)
Sodium: 138 mmol/L (ref 135–145)

## 2016-09-09 LAB — GLUCOSE, CAPILLARY
GLUCOSE-CAPILLARY: 119 mg/dL — AB (ref 65–99)
Glucose-Capillary: 123 mg/dL — ABNORMAL HIGH (ref 65–99)
Glucose-Capillary: 123 mg/dL — ABNORMAL HIGH (ref 65–99)
Glucose-Capillary: 126 mg/dL — ABNORMAL HIGH (ref 65–99)
Glucose-Capillary: 142 mg/dL — ABNORMAL HIGH (ref 65–99)
Glucose-Capillary: 154 mg/dL — ABNORMAL HIGH (ref 65–99)

## 2016-09-09 LAB — CBC
HCT: 39.8 % — ABNORMAL LOW (ref 40.0–52.0)
Hemoglobin: 12.8 g/dL — ABNORMAL LOW (ref 13.0–18.0)
MCH: 25.8 pg — ABNORMAL LOW (ref 26.0–34.0)
MCHC: 32.2 g/dL (ref 32.0–36.0)
MCV: 80.1 fL (ref 80.0–100.0)
Platelets: 185 10*3/uL (ref 150–440)
RBC: 4.97 MIL/uL (ref 4.40–5.90)
RDW: 16.9 % — ABNORMAL HIGH (ref 11.5–14.5)
WBC: 12.1 10*3/uL — AB (ref 3.8–10.6)

## 2016-09-09 LAB — BASIC METABOLIC PANEL
ANION GAP: 10 (ref 5–15)
Anion gap: 10 (ref 5–15)
BUN: 45 mg/dL — AB (ref 6–20)
BUN: 46 mg/dL — ABNORMAL HIGH (ref 6–20)
CALCIUM: 8 mg/dL — AB (ref 8.9–10.3)
CHLORIDE: 102 mmol/L (ref 101–111)
CO2: 27 mmol/L (ref 22–32)
CO2: 27 mmol/L (ref 22–32)
CREATININE: 3.36 mg/dL — AB (ref 0.61–1.24)
Calcium: 7.9 mg/dL — ABNORMAL LOW (ref 8.9–10.3)
Chloride: 102 mmol/L (ref 101–111)
Creatinine, Ser: 3.47 mg/dL — ABNORMAL HIGH (ref 0.61–1.24)
GFR calc Af Amer: 23 mL/min — ABNORMAL LOW (ref >60)
GFR calc non Af Amer: 20 mL/min — ABNORMAL LOW (ref >60)
GFR calc non Af Amer: 19 mL/min — ABNORMAL LOW (ref >60)
GFR, EST AFRICAN AMERICAN: 22 mL/min — AB (ref >60)
GLUCOSE: 133 mg/dL — AB (ref 65–99)
Glucose, Bld: 133 mg/dL — ABNORMAL HIGH (ref 65–99)
POTASSIUM: 4.2 mmol/L (ref 3.5–5.1)
Potassium: 4.2 mmol/L (ref 3.5–5.1)
SODIUM: 139 mmol/L (ref 135–145)
Sodium: 139 mmol/L (ref 135–145)

## 2016-09-09 LAB — APTT
aPTT: 113 seconds — ABNORMAL HIGH (ref 24–36)
aPTT: 58 seconds — ABNORMAL HIGH (ref 24–36)

## 2016-09-09 LAB — HEPARIN LEVEL (UNFRACTIONATED): Heparin Unfractionated: 1.67 IU/mL — ABNORMAL HIGH (ref 0.30–0.70)

## 2016-09-09 LAB — MAGNESIUM
MAGNESIUM: 2.3 mg/dL (ref 1.7–2.4)
MAGNESIUM: 2.4 mg/dL (ref 1.7–2.4)
MAGNESIUM: 2.4 mg/dL (ref 1.7–2.4)

## 2016-09-09 MED ORDER — VALPROATE SODIUM 500 MG/5ML IV SOLN
15.0000 mg/kg | Freq: Once | INTRAVENOUS | Status: AC
  Administered 2016-09-09: 1847 mg via INTRAVENOUS
  Filled 2016-09-09: qty 18.47

## 2016-09-09 MED ORDER — HEPARIN (PORCINE) IN NACL 100-0.45 UNIT/ML-% IJ SOLN
1600.0000 [IU]/h | INTRAMUSCULAR | Status: DC
  Administered 2016-09-09: 1300 [IU]/h via INTRAVENOUS
  Administered 2016-09-10: 1500 [IU]/h via INTRAVENOUS
  Administered 2016-09-11 – 2016-09-12 (×3): 1600 [IU]/h via INTRAVENOUS
  Filled 2016-09-09 (×5): qty 250

## 2016-09-09 MED ORDER — SODIUM CHLORIDE 0.9 % IV SOLN
400.0000 mg | Freq: Once | INTRAVENOUS | Status: AC
  Administered 2016-09-09: 400 mg via INTRAVENOUS
  Filled 2016-09-09: qty 40

## 2016-09-09 MED ORDER — STERILE WATER FOR INJECTION IJ SOLN
INTRAMUSCULAR | Status: AC
  Administered 2016-09-09: 10 mL
  Filled 2016-09-09: qty 10

## 2016-09-09 MED ORDER — HEPARIN SODIUM (PORCINE) 1000 UNIT/ML DIALYSIS
1000.0000 [IU] | INTRAMUSCULAR | Status: DC | PRN
  Administered 2016-09-11: 2800 [IU] via INTRAVENOUS_CENTRAL
  Administered 2016-09-18 – 2016-09-19 (×2): 2600 [IU] via INTRAVENOUS_CENTRAL
  Filled 2016-09-09 (×4): qty 1

## 2016-09-09 MED ORDER — VECURONIUM BROMIDE 10 MG IV SOLR
10.0000 mg | Freq: Once | INTRAVENOUS | Status: AC
  Administered 2016-09-09: 10 mg via INTRAVENOUS
  Filled 2016-09-09: qty 10

## 2016-09-09 MED ORDER — VECURONIUM BROMIDE 10 MG IV SOLR
10.0000 mg | INTRAVENOUS | Status: AC
  Administered 2016-09-09: 10 mg via INTRAVENOUS
  Filled 2016-09-09: qty 10

## 2016-09-09 MED ORDER — VALPROATE SODIUM 500 MG/5ML IV SOLN
500.0000 mg | Freq: Three times a day (TID) | INTRAVENOUS | Status: DC
  Administered 2016-09-09 – 2016-09-11 (×5): 500 mg via INTRAVENOUS
  Filled 2016-09-09 (×7): qty 5

## 2016-09-09 MED ORDER — PANTOPRAZOLE SODIUM 40 MG PO PACK
40.0000 mg | PACK | Freq: Every day | ORAL | Status: DC
  Administered 2016-09-09 – 2016-09-14 (×6): 40 mg
  Filled 2016-09-09 (×6): qty 20

## 2016-09-09 MED ORDER — PUREFLOW DIALYSIS SOLUTION
INTRAVENOUS | Status: DC
  Administered 2016-09-09: 3 via INTRAVENOUS_CENTRAL
  Administered 2016-09-10: 22:00:00 via INTRAVENOUS_CENTRAL
  Administered 2016-09-10 (×3): 3 via INTRAVENOUS_CENTRAL
  Administered 2016-09-11: 05:00:00 via INTRAVENOUS_CENTRAL

## 2016-09-09 NOTE — Progress Notes (Addendum)
ANTICOAGULATION CONSULT NOTE   Pharmacy Consult for heparin drip management  Indication: elevated tropinins   Pharmacy consulted for heparin drip management for 51 yo male admitted s/p vfib arrest. Patient had CPR x 3 rounds. Patient intubated and sedated; s/p targeted temperature management and now being initiated on CRRT. Patient takes apixaban as an outpatient currently has elevated anti-Xa level. Patient currently receiving heparin at 1200 units/hr.   Goal:  APTT: 68-109 anti-Xa: 0.3-0.7 Monitor platelets per protocol   Plan:  Will increase rate to 1300 units/hr. Will obtain next aPTT at 0200 on 6/14. Will manage patient with aPTTs until anti-Xa and aPTT levels correlate.    APTT (SEC) BOLUS DOSE STOP INFUSION (MINUTES) RATE CHANGE (mL/HR) REPEAT APTT  < 41 3000 units 0 + 1 ml/HR 6 HR  41 - 67 0 0 + 1 ml/HR 6 HR  68 - 109 0 0 NO CHANGE NEXT AM  110 - 136 0 0 - 1 ml/HR 6 HR  137 - 160 0 30 MIN - 1 ml/HR 6 HR  >160 0 60 MIN - 2 ml/HR 6 HR     No Known Allergies  Patient Measurements: Height: 6' (182.9 cm) Weight: (!) 422 lb (191.4 kg) IBW/kg (Calculated) : 77.6 Heparin Dosing Weight: 129kg  Vital Signs: Temp: 99.5 F (37.5 C) (06/13 1800) Temp Source: Other (Comment) (06/13 0800) BP: 130/94 (06/13 1800) Pulse Rate: 113 (06/13 1800)  Labs:  Recent Labs  08-02-16 0750 08-02-16 1208  08-02-16 1632 08-02-16 1830  09/08/16 0152  09/08/16 0532  09/08/16 2227 09/09/16 0209 09/09/16 0431 09/09/16 0605 09/09/16 1629  HGB 13.2  --   --   --   --   --  13.3  --   --   --   --   --  12.8*  --   --   HCT 40.7  --   --   --   --   --  41.3  --   --   --   --   --  39.8*  --   --   PLT 257  --   --   --   --   --  178  --   --   --   --   --  185  --   --   APTT  --  29  --   --  85*  --   --   < >  --   < > >160*  --   --  113* 58*  LABPROT 16.6* 16.9*  --   --  17.2*  --   --   --   --   --   --   --   --   --   --   INR 1.33 1.36  --   --  1.39  --   --   --   --    --   --   --   --   --   --   HEPARINUNFRC  --  2.42*  --   --   --   --  2.72*  --   --   --   --   --  1.67*  --   --   CREATININE 1.68* 1.96*  < > 2.12*  --   < > 2.46*  --  2.67*  < > 3.05* 3.36* 3.47*  --  4.16*  TROPONINI 0.08* 0.44*  --  0.47*  --   --  0.21*  --  0.15*  --   --   --   --   --   --   < > = values in this interval not displayed.  Estimated Creatinine Clearance: 37.8 mL/min (A) (by C-G formula based on SCr of 4.16 mg/dL (H)).   Pharmacy will continue to monitor and adjust per consult.   Gertrude Bucks L, [facility] 09/09/2016,6:23 PM

## 2016-09-09 NOTE — Progress Notes (Signed)
RN made Dr. Nicholos Johnsamachandran aware that CT of the head results show edema.  MD acknowledged and gave no new orders.

## 2016-09-09 NOTE — Progress Notes (Signed)
RN asked [name], NP to come to room and assess patient due to increased work of breathing and desaturation on ventilator.  NP came to bedside and assessed patient.  Versed and fentanyl boluses have been given with little improvement therefore NP gave order for 10 mg of IV vecuronium.  Family at bedside and updated by NP and RN.

## 2016-09-09 NOTE — Progress Notes (Addendum)
[facility] Consultation    SYNOPSIS   51 yo male with DM, afib,  cardiac arrest at home with Vfib arrest. Resuscitated, s/p cooling, now in rewarming phase.   ASSESSMENT/PLAN   Addendum: Discussed Ct head findings of cerebral edema with neurology. Hypertonic/mannitol unlikely to change outcome, continue with current measures, await EEG.   PULMONARY A:Acute hypoxic respiratory failure with cardiac arrest. Blood gas 7.24/65/64/27.9 Consistent with  respiratory acidosis. P:   Continue vent support, rate increased  VENTILATOR SETTINGS: Vent Mode: PRVC FiO2 (%):  [40 %-50 %] 45 % Set Rate:  [16 bmp-20 bmp] 20 bmp Vt Set:  [500 mL] 500 mL PEEP:  [10 cmH20-12 cmH20] 10 cmH20 Plateau Pressure:  [26 cmH20] 26 cmH20  CARDIOVASCULAR A: Cardiac and vfib arrest.  no evidence of acute MI, cardiology following P:  Continue  supportive measures.  Continue IV heparin, given history of atrial fibrillation.   HEMODYNAMICS: CVP:  [9 mmHg-23 mmHg] 10 mmHg  RENAL A:  Oliguric acute renal failure. P:   Likely secondary to above, continue to monitor.  Minimal urine output was noted over the last 24 hours, will consult nephrology, the patient may need to be placed on CRRT.  INTAKE / OUTPUT:  Intake/Output Summary (Last 24 hours) at 09/09/16 0916 Last data filed at 09/09/16 0900  Gross per 24 hour  Intake          2668.43 ml  Output              129 ml  Net          2539.43 ml    GASTROINTESTINAL A:  -- HEMATOLOGIC A:  --  INFECTIOUS A:  -- P:    Micro/culture results:  BCx2 Negative to date UC -- Sputum pending  Antibiotics:   ENDOCRINE A:  DM  P:   SSI  NEUROLOGIC A:  Acute anoxic encephalopathy.  CT head negative.  Possible seizure activity noted this morning P:   --Discussed with neurology, will give dose of Vimpat this morning. Repeat EEG --Patient has minimal gag, but positive cough, appears to be opening eyes but nonpurposeful.  Possible seizures.   MAJOR EVENTS/TEST RESULTS: 06/11:Cardiac arrest, admitted, intubated, CVL placed 06/12: Minimal urine output, CVL retracted. 06/13; rewarming, possible seizures noted on decreasing sedation.  Best Practices  DVT Prophylaxis: IV heparin GI Prophylaxis: PPI  ---------------------------------------  ---------------------------------------   Name: Lillia MountainJeffrey M Pavel MRN: [id number] DOB: 04/19/1965    ADMISSION DATE:  09/16/2016 Subjective.  The pati  REVIEW OF SYSTEMS:   History could not be obtained due to unresponsiveness.   VITAL SIGNS: Temp:  [90.5 F (32.5 C)-98.8 F (37.1 C)] 98.8 F (37.1 C) (06/13 0900) Pulse Rate:  [27-117] 100 (06/13 0845) Resp:  [16-20] 19 (06/13 0845) BP: (85-121)/(67-86) 93/67 (06/13 0845) SpO2:  [91 %-95 %] 93 % (06/13 0845) FiO2 (%):  [40 %-50 %] 45 % (06/13 0814) Weight:  [422 lb (191.4 kg)-458 lb (207.7 kg)] 422 lb (191.4 kg) (06/13 0416) HEMODYNAMICS: CVP:  [9 mmHg-23 mmHg] 10 mmHg VENTILATOR SETTINGS: Vent Mode: PRVC FiO2 (%):  [40 %-50 %] 45 % Set Rate:  [16 bmp-20 bmp] 20 bmp Vt Set:  [500 mL] 500 mL PEEP:  [10 cmH20-12 cmH20] 10 cmH20 Plateau Pressure:  [26 cmH20] 26 cmH20 INTAKE / OUTPUT:  Intake/Output Summary (Last 24 hours) at 09/09/16 0916 Last data filed at 09/09/16 0900  Gross per 24 hour  Intake  4132.44 ml  Output              129 ml  Net          2539.43 ml    Physical Examination:   VS: BP 93/67   Pulse 100   Temp 98.8 F (37.1 C)   Resp 19   Ht 6' (1.829 m)   Wt (!) 422 lb (191.4 kg)   SpO2 93%   BMI 57.23 kg/m   General Appearance: No distress  Neuro:without focal findings, twitching of eyes and face on reducing sedations. Negative oculocephalic reflex, negative gag. Positive cough with deep suctioning. HEENT: PERRLA, EOM intact, no ptosis, no other lesions noticed;  Pulmonary: decreased air entry bilaterally.  CardiovascularNormal S1,S2.  No m/r/g.    Abdomen: Benign,  Soft, non-tender, No masses, hepatosplenomegaly, No lymphadenopathy Renal:  No costovertebral tenderness  GU:  Not performed at this time. Endoc: No evident thyromegaly, no signs of acromegaly. Skin:   warm, no rashes, no ecchymosis  Extremities: normal, no cyanosis, clubbing, no edema, warm with normal capillary refill.    LABS: Reviewed   LABORATORY PANEL:   CBC  Recent Labs Lab 09/09/16 0431  WBC 12.1*  HGB 12.8*  HCT 39.8*  PLT 185    Chemistries   Recent Labs Lab Sep 10, 2016 0750  09/08/16 0152  09/09/16 0431  NA 137  < > 140  < > 139  K 3.7  < > 4.1  < > 4.2  CL 100*  < > 103  < > 102  CO2 28  < > 29  < > 27  GLUCOSE 276*  < > 134*  < > 133*  BUN 23*  < > 36*  < > 46*  CREATININE 1.68*  < > 2.46*  < > 3.47*  CALCIUM 9.2  < > 8.4*  < > 7.9*  MG  --   < > 2.4  --  2.3  PHOS  --   --  4.2  --   --   AST 102*  --   --   --   --   ALT 60  --   --   --   --   ALKPHOS 93  --   --   --   --   BILITOT 1.4*  --   --   --   --   < > = values in this interval not displayed.   Recent Labs Lab 09/08/16 1616 09/08/16 1816 09/08/16 2014 09/08/16 2347 09/09/16 0351 09/09/16 0722  GLUCAP 197* 137* 125* 127* 126* 123*    Recent Labs Lab 09/10/2016 1030 09/08/16 0500 09/09/16 0500  PHART 7.31* 7.29* 7.24*  PCO2ART 58* 55* 65*  PO2ART 95 168* 64*    Recent Labs Lab 09/10/16 0750  AST 102*  ALT 60  ALKPHOS 93  BILITOT 1.4*  ALBUMIN 3.1*    Cardiac Enzymes  Recent Labs Lab 09/08/16 0532  TROPONINI 0.15*    RADIOLOGY:       --Wells Guiles, MD.  Board Certified in Internal Medicine, Pulmonary Medicine, Critical Care Medicine, and Sleep Medicine.  ICU Pager [phone] [facility] Office Number: [phone]  Santiago Glad, M.D.  Billy Fischer, M.D   09/09/2016, 9:16 AM  Critical Care Attestation.  I have personally obtained a history, examined the patient, evaluated laboratory and imaging results, formulated  the assessment and plan and placed orders. The Patient requires high complexity decision making for assessment and support, frequent evaluation  and titration of therapies, application of advanced monitoring technologies and extensive interpretation of multiple databases. The patient has critical illness that could lead imminently to failure of 1 or more organ systems and requires the highest level of physician preparedness to intervene.  Critical Care Time devoted to patient care services described in this note is 40 minutes and is exclusive of time spent in procedures supervisory time of NP.

## 2016-09-09 NOTE — Consult Note (Addendum)
Reason for Consult: cardiac arrest  Referring Physician: Dr. Renae GlossWieting  CC: cardiac arrest   Titration off sedation pt started to have facial myoclonus.  Not following commands and no improvement in exam.    Past Medical History:  Diagnosis Date  . CHF (congestive heart failure) (HCC)   . Diabetes mellitus without complication (HCC)   . Hypertension     Past Surgical History:  Procedure Laterality Date  . NO PAST SURGERIES      Family History  Problem Relation Age of Onset  . Hypertension Mother   . Hypertension Father   . Diabetes Father     Social History:  reports that he has never smoked. He has never used smokeless tobacco. He reports that he does not drink alcohol or use drugs.  No Known Allergies  Medications: I have reviewed the patient's current medications.  ROS: Unable to obtain   Physical Examination: Blood pressure 105/76, pulse (!) 103, temperature 99.7 F (37.6 C), resp. rate 20, height 6' (1.829 m), weight (!) 191.4 kg (422 lb), SpO2 96 %.  Pt is sedated, paralyzed and not following commands.     Laboratory Studies:   Basic Metabolic Panel:  Recent Labs Lab 02-13-17 2036  09/08/16 0152  09/08/16 1418 09/08/16 1800 09/08/16 2227 09/09/16 0209 09/09/16 0431  NA  --   < > 140  < > 137 139 139 139 139  K  --   < > 4.1  < > 4.5 4.3 4.1 4.2 4.2  CL  --   < > 103  < > 103 102 103 102 102  CO2  --   < > 29  < > 26 27 25 27 27   GLUCOSE  --   < > 134*  < > 131* 142* 145* 133* 133*  BUN  --   < > 36*  < > 40* 41* 42* 45* 46*  CREATININE  --   < > 2.46*  < > 3.05* 2.94* 3.05* 3.36* 3.47*  CALCIUM  --   < > 8.4*  < > 8.1* 8.1* 7.7* 8.0* 7.9*  MG 2.2  --  2.4  --   --   --   --   --  2.3  PHOS  --   --  4.2  --   --   --   --   --   --   < > = values in this interval not displayed.  Liver Function Tests:  Recent Labs Lab 02-13-17 0750  AST 102*  ALT 60  ALKPHOS 93  BILITOT 1.4*  PROT 7.0  ALBUMIN 3.1*   No results for input(s): LIPASE,  AMYLASE in the last 168 hours. No results for input(s): AMMONIA in the last 168 hours.  CBC:  Recent Labs Lab 02-13-17 0750 09/08/16 0152 09/09/16 0431  WBC 15.4* 8.2 12.1*  NEUTROABS 12.6*  --   --   HGB 13.2 13.3 12.8*  HCT 40.7 41.3 39.8*  MCV 80.8 80.5 80.1  PLT 257 178 185    Cardiac Enzymes:  Recent Labs Lab 02-13-17 0750 02-13-17 1208 02-13-17 1632 09/08/16 0152 09/08/16 0532  TROPONINI 0.08* 0.44* 0.47* 0.21* 0.15*    BNP: Invalid input(s): POCBNP  CBG:  Recent Labs Lab 09/08/16 2014 09/08/16 2347 09/09/16 0351 09/09/16 0722 09/09/16 1145  GLUCAP 125* 127* 126* 123* 119*    Microbiology: Results for orders placed or performed during the hospital encounter of 02-13-17  Culture, blood (routine x 2)  Status: None (Preliminary result)   Collection Time: 2016/09/08  8:18 AM  Result Value Ref Range Status   Specimen Description BLOOD L AC  Final   Special Requests   Final    BOTTLES DRAWN AEROBIC AND ANAEROBIC Blood Culture adequate volume   Culture NO GROWTH 2 DAYS  Final   Report Status PENDING  Incomplete  MRSA PCR Screening     Status: None   Collection Time: 2016-09-08  9:46 AM  Result Value Ref Range Status   MRSA by PCR NEGATIVE NEGATIVE Final    Comment:        The GeneXpert MRSA Assay (FDA approved for NASAL specimens only), is one component of a comprehensive MRSA colonization surveillance program. It is not intended to diagnose MRSA infection nor to guide or monitor treatment for MRSA infections.   Culture, blood (routine x 2)     Status: None (Preliminary result)   Collection Time: 2016-09-08 11:45 AM  Result Value Ref Range Status   Specimen Description BLOOD L HAND  Final   Special Requests   Final    BOTTLES DRAWN AEROBIC ONLY Blood Culture results may not be optimal due to an inadequate volume of blood received in culture bottles   Culture NO GROWTH 2 DAYS  Final   Report Status PENDING  Incomplete  Culture, respiratory  (NON-Expectorated)     Status: None (Preliminary result)   Collection Time: September 08, 2016  6:32 PM  Result Value Ref Range Status   Specimen Description TRACHEAL ASPIRATE  Final   Special Requests NONE  Final   Gram Stain   Final    FEW WBC PRESENT, PREDOMINANTLY PMN MODERATE GRAM POSITIVE COCCI IN CHAINS IN PAIRS MODERATE GRAM NEGATIVE RODS    Culture   Final    CULTURE REINCUBATED FOR BETTER GROWTH Performed at [facility], [address], [place], [place] [address]    Report Status PENDING  Incomplete    Coagulation Studies:  Recent Labs  09-08-2016 0750 2016-09-08 1208 08-Sep-2016 1830  LABPROT 16.6* 16.9* 17.2*  INR 1.33 1.36 1.39    Urinalysis: No results for input(s): COLORURINE, LABSPEC, PHURINE, GLUCOSEU, HGBUR, BILIRUBINUR, KETONESUR, PROTEINUR, UROBILINOGEN, NITRITE, LEUKOCYTESUR in the last 168 hours.  Invalid input(s): APPERANCEUR  Lipid Panel:  No results found for: CHOL, TRIG, HDL, CHOLHDL, VLDL, LDLCALC  HgbA1C:  Lab Results  Component Value Date   HGBA1C 6.2 (H) 09-08-16    Urine Drug Screen:      Component Value Date/Time   LABOPIA NONE DETECTED 09-08-16 1440   COCAINSCRNUR NONE DETECTED 2016/09/08 1440   LABBENZ POSITIVE (A) 09/08/2016 1440   AMPHETMU NONE DETECTED 09-08-2016 1440   THCU NONE DETECTED 2016/09/08 1440   LABBARB NONE DETECTED Sep 08, 2016 1440    Alcohol Level:   Recent Labs Lab September 08, 2016 0750  ETH <5    Other results:   Imaging: Dg Chest 1 View  Result Date: 09/09/2016 CLINICAL DATA:  Dyspnea EXAM: CHEST 1 VIEW COMPARISON:  09/08/2016 FINDINGS: Endotracheal tube and nasogastric catheter are noted in satisfactory position. Left jugular central line is noted although the tip does not appear to extend into the innominate vein. A right jugular central line is noted extending into the right atrium. Mild vascular congestion is seen with mild interstitial edema. No focal infiltrate is seen no pneumothorax is noted. IMPRESSION:  Stable changes in the lungs. New right jugular central line is seen without pneumothorax. Electronically Signed   By: Alcide Clever M.D.   On: 09/09/2016  12:09   Dg Chest 1 View  Addendum Date: 09/08/2016   ADDENDUM REPORT: 09/08/2016 17:27 ADDENDUM: After reviewing the chest x-ray and chest CT with Dr. Bradly Chris, it appears the patient had central line placed after the 09-29-16 CT and radiopaque structure arising from the posterior aspect the brachiocephalic vein is reflux of contrast into the superior intercostal vessel. This was discussed with the patient's nurse Adron Bene. Electronically Signed   By: Lacy Duverney M.D.   On: 09/08/2016 17:27   Result Date: 09/08/2016 CLINICAL DATA:  51 year old male with dyspnea. Subsequent encounter. EXAM: CHEST 1 VIEW COMPARISON:  Sep 29, 2016 FINDINGS: Left central line in the atypical position. On recent chest CT, this appears outside the left brachycephalic vein coursing over the aortic arch. This needs to be removed. Endotracheal tube tip 3.2 cm above the carina. Cardiomegaly. Suspect mild pulmonary edema without significant change. IMPRESSION: Left central line in the atypical position. On recent chest CT, this appears outside the left brachycephalic vein coursing over the aortic arch. This needs to be removed. Cardiomegaly. Suspect mild pulmonary edema without significant change. These results were called by telephone at the time of interpretation on 09/08/2016 at 7:04 am to Adron Bene, RN who verbally acknowledged these results. Electronically Signed: By: Lacy Duverney M.D. On: 09/08/2016 07:12   Dg Chest Port 1 View  Result Date: 09/08/2016 CLINICAL DATA:  Central line placement EXAM: PORTABLE CHEST 1 VIEW COMPARISON:  09/08/2016 FINDINGS: Endotracheal tube with the tip 5.6 cm above the carina. Nasogastric tube coursing below the diaphragm. Left central venous catheter with the tip projecting over the proximal jugular vein, but is not well delineated secondary  to technique. Mild bilateral diffuse interstitial thickening. No pleural effusion or pneumothorax. Stable cardiomegaly. IMPRESSION: 1. Endotracheal tube with the tip 5.6 cm above the carina. 2. Left central venous catheter with the tip projecting over the proximal jugular vein, but is not well delineated secondary to technique. 3. Cardiomegaly with mild pulmonary vascular congestion. Electronically Signed   By: Elige Ko   On: 09/08/2016 09:50     Assessment/Plan:  51 y.o. male  with a known history of Hypertension diabetes and congestive heart failure. Pt is s/p fall and cardiac arrest with CPR x 3 rounds.  There was suspected shaking episode prior to arrest.  CTH no acute abnormalities on initial scan. Pt is on normothermia.   EEG done while pt was paralyzed no seizure activity Today see a lot of myoclonus type of symptoms on R side of face mostly. This is likely poor prognosticator EEG will be done again today Loaded with VPA at 15mg /kg after which will start 500 q 8 hrs.  Pt is a dialysis pt which will be started today.     Will like to see repeat imaging for better prognostication but given the myoclonus and poor examination will likely be poor prognosis.    Pauletta Browns   09/09/2016, 12:56 PM   Addendum: CTH clear evidence of loss of grey/white matter differentiation with edema consistent with anoxia. Likely poor prognosis at this time given the myoclonus and anoxic brain injury  No role of mannitol or hypertonic saline

## 2016-09-09 NOTE — Progress Notes (Signed)
ANTICOAGULATION CONSULT NOTE   Pharmacy Consult for heparin drip management  Indication: elevated tropinins   Pharmacy consulted for heparin drip management for 51 yo male admitted s/p vfib arrest. Patient had CPR x 3 rounds. Patient intubated and sedated; currently undergoing targeted temperature management. Head CT is negative. Patient takes apixaban as an outpatient currently has elevated anti-Xa level.   Goal:  APTT: 68-109 anti-Xa: 0.3-0.7 Monitor platelets per protocol   Plan:  Will manage patient with aPTTs until anti-Xa and aPTT levels correlate.    APTT (SEC) BOLUS DOSE STOP INFUSION (MINUTES) RATE CHANGE (mL/HR) REPEAT APTT  < 41 3000 units 0 + 1 ml/HR 6 HR  41 - 67 0 0 + 1 ml/HR 6 HR  68 - 109 0 0 NO CHANGE NEXT AM  110 - 136 0 0 - 1 ml/HR 6 HR  137 - 160 0 30 MIN - 1 ml/HR 6 HR  >160 0 60 MIN - 2 ml/HR 6 HR   6/13 ~ 06:00 HL = 1.67, APTT = 113. Decreased heparin drip rate to 1200 units/hr.  Will recheck HL today at 12:00.   No Known Allergies  Patient Measurements: Height: 6' (182.9 cm) Weight: (!) 422 lb (191.4 kg) IBW/kg (Calculated) : 77.6 Heparin Dosing Weight: 129kg  Vital Signs: Temp: 97.3 F (36.3 C) (06/13 0600) Temp Source: Core (Comment) (06/13 0600) BP: 97/73 (06/13 0715) Pulse Rate: 99 (06/13 0715)  Labs:  Recent Labs  09-09-2016 0750 09/09/16 1208  09/09/16 1632 09-09-2016 1830  09/08/16 0152  09/08/16 0532 09/08/16 1110  09/08/16 2227 09/09/16 0209 09/09/16 0431 09/09/16 0605  HGB 13.2  --   --   --   --   --  13.3  --   --   --   --   --   --  12.8*  --   HCT 40.7  --   --   --   --   --  41.3  --   --   --   --   --   --  39.8*  --   PLT 257  --   --   --   --   --  178  --   --   --   --   --   --  185  --   APTT  --  29  --   --  85*  --   --   < >  --  >160*  --  >160*  --   --  113*  LABPROT 16.6* 16.9*  --   --  17.2*  --   --   --   --   --   --   --   --   --   --   INR 1.33 1.36  --   --  1.39  --   --   --   --   --   --    --   --   --   --   HEPARINUNFRC  --  2.42*  --   --   --   --  2.72*  --   --   --   --   --   --  1.67*  --   CREATININE 1.68* 1.96*  < > 2.12*  --   < > 2.46*  --  2.67* 2.82*  < > 3.05* 3.36* 3.47*  --   TROPONINI 0.08* 0.44*  --  0.47*  --   --  0.21*  --  0.15*  --   --   --   --   --   --   < > = values in this interval not displayed.  Estimated Creatinine Clearance: 45.3 mL/min (A) (by C-G formula based on SCr of 3.47 mg/dL (H)).   Pharmacy will continue to monitor and adjust per consult.   Stormy CardKatsoudas,Elbert Polyakov K, RPH 09/09/2016,7:34 AM

## 2016-09-09 NOTE — Progress Notes (Signed)
Dr. Nicholos Johnsamachandran gave order for 10 mg of vecuronium to be given just prior to inserting dialysis catheter.

## 2016-09-09 NOTE — Care Management (Addendum)
RNCM met at patient's bedside with patient's mother and father. I explained my role as RNCM. Patient's mother expressed the emotional desire that patient be able to return home. RNCM shared contact number with both parents in the event they had any questions or concerns. RNCM will continue to follow. Repeat EEG pending; CRRT.

## 2016-09-09 NOTE — Progress Notes (Signed)
Patient ID: Isaiah [name], male   DOB: 22-Dec-1965, 51 y.o.   MRN: [id number]  [employer] PROGRESS NOTE  Isaiah [name]:[id number] DOB: 1966-01-29 DOA: 09/01/2016 PCP: Patient, No Pcp Per  HPI/Subjective: Patient unresponsive to sternal rub. Came in with cardiac arrest.  Objective: Vitals:   09/09/16 0845 09/09/16 0900  BP: 93/67   Pulse: 100   Resp: 19   Temp:  98.8 F (37.1 C)    Filed Weights   08/29/2016 0954 09/08/16 1406 09/09/16 0416  Weight: (!) 205.6 kg (453 lb 4.3 oz) (!) 207.7 kg (458 lb) (!) 191.4 kg (422 lb)    ROS: Review of Systems  Unable to perform ROS: Critical illness   Exam: Physical Exam  HENT:  Nose: No mucosal edema.  Unable to look into mouth at this time  Eyes: Conjunctivae and lids are normal.  Pupils pinpoint  Neck: Carotid bruit is not present. No thyroid mass and no thyromegaly present.  Cardiovascular: S1 normal and S2 normal.  Exam reveals distant heart sounds. Exam reveals no gallop.   No murmur heard. Pulses:      Dorsalis pedis pulses are 2+ on the right side, and 2+ on the left side.  Respiratory: No respiratory distress. He has decreased breath sounds in the right middle field, the right lower field, the left middle field and the left lower field. He has wheezes in the right middle field and the left middle field. He has no rhonchi. He has no rales.  GI: Soft. Bowel sounds are normal. There is no tenderness.  Musculoskeletal:       Right knee: He exhibits swelling.       Left knee: He exhibits swelling.       Right ankle: He exhibits swelling.       Left ankle: He exhibits swelling.  Lymphadenopathy:    He has no cervical adenopathy.  Neurological: He is unresponsive.  Right facial twitching  Skin: No rash noted. Nails show no clubbing.  Psychiatric:  Unable to assess at this time secondary to unresponsive      Data Reviewed: Basic Metabolic Panel:  Recent Labs Lab 09/06/2016 2036  09/08/16 0152  09/08/16 1418  09/08/16 1800 09/08/16 2227 09/09/16 0209 09/09/16 0431  NA  --   < > 140  < > 137 139 139 139 139  K  --   < > 4.1  < > 4.5 4.3 4.1 4.2 4.2  CL  --   < > 103  < > 103 102 103 102 102  CO2  --   < > 29  < > _0 GLUCOSE  --   < > 134*  < > 131* 142* 145* 133* 133*  BUN  --   < > 36*  < > 40* 41* 42* 45* 46*  CREATININE  --   < > 2.46*  < > 3.05* 2.94* 3.05* 3.36* 3.47*  CALCIUM  --   < > 8.4*  < > 8.1* 8.1* 7.7* 8.0* 7.9*  MG 2.2  --  2.4  --   --   --   --   --  2.3  PHOS  --   --  4.2  --   --   --   --   --   --   < > = values in this interval not displayed. Liver Function Tests:  Recent Labs Lab 09/15/2016 0750  AST 102*  ALT 60  ALKPHOS 93  BILITOT 1.4*  PROT 7.0  ALBUMIN 3.1*   CBC:  Recent Labs Lab 09/26/2016 0750 09/08/16 0152 09/09/16 0431  WBC 15.4* 8.2 12.1*  NEUTROABS 12.6*  --   --   HGB 13.2 13.3 12.8*  HCT 40.7 41.3 39.8*  MCV 80.8 80.5 80.1  PLT 257 178 185   Cardiac Enzymes:  Recent Labs Lab 09/24/2016 0750 09/12/2016 1208 09/16/2016 1632 09/08/16 0152 09/08/16 0532  TROPONINI 0.08* 0.44* 0.47* 0.21* 0.15*   BNP (last 3 results)  Recent Labs  09/18/2016 0750  BNP 508.0*     CBG:  Recent Labs Lab 09/08/16 1816 09/08/16 2014 09/08/16 2347 09/09/16 0351 09/09/16 0722  GLUCAP 137* 125* 127* 126* 123*    Recent Results (from the past 240 hour(s))  Culture, blood (routine x 2)     Status: None (Preliminary result)   Collection Time: 09/22/2016  8:18 AM  Result Value Ref Range Status   Specimen Description BLOOD L AC  Final   Special Requests   Final    BOTTLES DRAWN AEROBIC AND ANAEROBIC Blood Culture adequate volume   Culture NO GROWTH 2 DAYS  Final   Report Status PENDING  Incomplete  MRSA PCR Screening     Status: None   Collection Time: 09/25/2016  9:46 AM  Result Value Ref Range Status   MRSA by PCR NEGATIVE NEGATIVE Final    Comment:        The GeneXpert MRSA Assay (FDA approved for NASAL specimens only), is one  component of a comprehensive MRSA colonization surveillance program. It is not intended to diagnose MRSA infection nor to guide or monitor treatment for MRSA infections.   Culture, blood (routine x 2)     Status: None (Preliminary result)   Collection Time: 09/10/2016 11:45 AM  Result Value Ref Range Status   Specimen Description BLOOD L HAND  Final   Special Requests   Final    BOTTLES DRAWN AEROBIC ONLY Blood Culture results may not be optimal due to an inadequate volume of blood received in culture bottles   Culture NO GROWTH 2 DAYS  Final   Report Status PENDING  Incomplete  Culture, respiratory (NON-Expectorated)     Status: None (Preliminary result)   Collection Time: 09/26/2016  6:32 PM  Result Value Ref Range Status   Specimen Description TRACHEAL ASPIRATE  Final   Special Requests NONE  Final   Gram Stain   Final    FEW WBC PRESENT, PREDOMINANTLY PMN MODERATE GRAM POSITIVE COCCI IN CHAINS IN PAIRS MODERATE GRAM NEGATIVE RODS    Culture   Final    CULTURE REINCUBATED FOR BETTER GROWTH Performed at [facility], [place] [address] [place], [place] [address]    Report Status PENDING  Incomplete     Studies: Dg Chest 1 View  Addendum Date: 09/08/2016   ADDENDUM REPORT: 09/08/2016 17:27 ADDENDUM: After reviewing the chest x-ray and chest CT with Dr. Clovis Riley, it appears the patient had central line placed after the 09/12/2016 CT and radiopaque structure arising from the posterior aspect the brachiocephalic vein is reflux of contrast into the superior intercostal vessel. This was discussed with the patient's nurse Adalberto Ill. Electronically Signed   By: Genia Del M.D.   On: 09/08/2016 17:27   Result Date: 09/08/2016 CLINICAL DATA:  51 year old male with dyspnea. Subsequent encounter. EXAM: CHEST 1 VIEW COMPARISON:  09/23/2016 FINDINGS: Left central line in the atypical position. On recent chest CT, this appears outside the left brachycephalic vein coursing  over the aortic  arch. This needs to be removed. Endotracheal tube tip 3.2 cm above the carina. Cardiomegaly. Suspect mild pulmonary edema without significant change. IMPRESSION: Left central line in the atypical position. On recent chest CT, this appears outside the left brachycephalic vein coursing over the aortic arch. This needs to be removed. Cardiomegaly. Suspect mild pulmonary edema without significant change. These results were called by telephone at the time of interpretation on 09/08/2016 at 7:04 am to Adalberto Ill, RN who verbally acknowledged these results. Electronically Signed: By: Genia Del M.D. On: 09/08/2016 07:12   Dg Chest Port 1 View  Result Date: 09/08/2016 CLINICAL DATA:  Central line placement EXAM: PORTABLE CHEST 1 VIEW COMPARISON:  09/08/2016 FINDINGS: Endotracheal tube with the tip 5.6 cm above the carina. Nasogastric tube coursing below the diaphragm. Left central venous catheter with the tip projecting over the proximal jugular vein, but is not well delineated secondary to technique. Mild bilateral diffuse interstitial thickening. No pleural effusion or pneumothorax. Stable cardiomegaly. IMPRESSION: 1. Endotracheal tube with the tip 5.6 cm above the carina. 2. Left central venous catheter with the tip projecting over the proximal jugular vein, but is not well delineated secondary to technique. 3. Cardiomegaly with mild pulmonary vascular congestion. Electronically Signed   By: Kathreen Devoid   On: 09/08/2016 09:50   Dg Chest Port 1 View  Result Date: 09/17/2016 CLINICAL DATA:  Line placement after cardiac arrest today. EXAM: PORTABLE CHEST 1 VIEW COMPARISON:  CT chest and chest radiograph 09/06/2016. FINDINGS: Endotracheal tube terminates 4.6 cm above the carina. Left IJ central line tip is poorly visualized due to body habitus. It is followed into the left brachiocephalic region but is difficult to follow any further. Nasogastric tube is followed into the distal esophagus but again, is difficult  to follow further due to body habitus. Defibrillator pad is seen over the left chest. Heart is enlarged. Lungs are somewhat low in volume with mild interstitial prominence and indistinctness. No definite pleural fluid. No pneumothorax. IMPRESSION: 1. Body habitus is limiting and degrades image quality. 2. Left IJ central line tip is followed into the left brachiocephalic vein but is difficult to see beyond that due to body habitus. 3. Suspect mild pulmonary edema. Electronically Signed   By: Lorin Picket M.D.   On: 09/21/2016 11:31    Scheduled Meds: . budesonide (PULMICORT) nebulizer solution  0.5 mg Nebulization BID  . chlorhexidine gluconate (MEDLINE KIT)  15 mL Mouth Rinse BID  . diltiazem  60 mg Oral Q8H  . feeding supplement (VITAL HIGH PROTEIN)  1,000 mL Per Tube Q24H  . furosemide  60 mg Intravenous Q8H  . insulin aspart  2-6 Units Subcutaneous Q4H  . insulin glargine  10 Units Subcutaneous Q24H  . ipratropium-albuterol  3 mL Nebulization Q6H  . mouth rinse  15 mL Mouth Rinse 10 times per day  . pantoprazole sodium  40 mg Per Tube Daily  . sodium chloride flush  10-40 mL Intracatheter Q12H   Continuous Infusions: . sodium chloride 10 mL/hr at 09/09/16 0850  . amiodarone 30 mg/hr (09/09/16 0517)  . epinephrine    . fentaNYL infusion INTRAVENOUS 100 mcg/hr (09/09/16 0619)  . heparin 1,200 Units/hr (09/09/16 0800)  . midazolam (VERSED) infusion 2 mg/hr (09/08/16 2023)  . norepinephrine (LEVOPHED) Adult infusion 5 mcg/min (09/09/16 0900)    Assessment/Plan:  1. Cardiac arrest. My suspicion this is arrhythmia related with an EF of 10% seen on echocardiogram. Patient is starting to be warmed  today. 2. 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 On Lasix 60 mg IV every 8 hours. Other cardiac meds on hold at this point secondary to Hypotension.  With EF of 10% his overall prognosis is poor.  3. Acute kidney injury secondary to cardiac  arrest, CT scan with contrast. Case discussed with critical care specialist and nephrologist and dialysis will be started today. 4. Acute encephalopathy. Patient with right facial twitching. Patient currently on sedation with fentanyl and versed. Previous EEG showing no seizures. Patient started on Vimpat.  Neurology follow up.  Could be seizure with anoxic encephalopathy.  Recommend repeat CT head at some point. 5. Hypotension- on levophed while on sedation 6. Type 2 diabetes mellitus. On sliding scale and low-dose Levemir. 7. Atrial fibrillation. Patient on heparin drip at this point. Patient on amiodarone drip 8. Morbid obesity and likely sleep apnea. Weight loss needed.  Code Status:     Code Status Orders        Start     Ordered   09/25/2016 1005  Full code  Continuous     09/25/2016 1007    Code Status History    Date Active Date Inactive Code Status Order ID Comments User Context   09/09/2016  8:30 AM 09/21/2016 10:08 AM Full Code [id number]  Loletha Grayer, MD ED     Family Communication: As per critical care specialist Disposition Plan: To be determined based on clinical course. Overall prognosis is poor. High likelihood another cardiac arrest  Consultants:  Critical care specialist  Cardiology  Neurology  Nephrology  Time spent: 24 minutes  [place], [place]

## 2016-09-09 NOTE — Consult Note (Signed)
[facility]  CONSULT NOTE    Date: 09/09/2016                  [name] Name:  Isaiah [name]  MRN: [id number]  DOB: [birth year]  Age / Sex: 51 y.o., male         PCP: [name], No Pcp Per                 Service Requesting Consult: Dr. Ashby Dawes                 Reason for Consult: Acute renal failure            History of Present Illness: Isaiah [name] is a 51 y.o. black male with diabetes mellitus type II, hyeprtension, congestive heart failure, atrial fibrillation,  who was admitted to [facility] on 09/16/2016 for Cardiac arrest ([place]) [I46.9] Wheeze [R06.2]  [name]'s parents at bedside.   [name] placed on cryotherapy. Intubated, sedated. Now with anuria.    Medications: Outpatient medications: Prescriptions Prior to Admission  Medication Sig Dispense Refill Last Dose  . apixaban (ELIQUIS) 5 MG TABS tablet Take 5 mg by mouth 2 (two) times daily.   unknown at unknown  . atorvastatin (LIPITOR) 20 MG tablet Take 20 mg by mouth daily.   unknown at unknown  . benazepril (LOTENSIN) 40 MG tablet Take 40 mg by mouth daily.   unknown at unknown  . carvedilol (COREG) 25 MG tablet Take 25 mg by mouth 2 (two) times daily with a meal.   unknown at unknown  . diltiazem (DILACOR XR) 240 MG 24 hr capsule Take 240 mg by mouth.   unknown at unknown  . furosemide (LASIX) 80 MG tablet Take 80 mg by mouth daily as needed.   unknown at unknown  . glimepiride (AMARYL) 2 MG tablet Take 2 mg by mouth daily with breakfast.   unknown at unknown  . magnesium oxide (MAG-OX) 400 MG tablet Take 400 mg by mouth.   unknown at unknown  . METFORMIN HCL ER PO Take 1,000 mg by mouth 2 (two) times daily.   unknown at unknown  . metolazone (ZAROXOLYN) 2.5 MG tablet Take 2.5 mg by mouth every other day.   unknown at unknown    Current medications: Current Facility-Administered Medications  Medication Dose Route Frequency Provider Last Rate Last Dose  . 0.9 %  sodium chloride infusion    Intravenous Continuous Laverle Hobby, MD 10 mL/hr at 09/09/16 0850    . acetaminophen (TYLENOL) tablet 650 mg  650 mg Oral Q6H PRN Loletha Grayer, MD       Or  . acetaminophen (TYLENOL) suppository 650 mg  650 mg Rectal Q6H PRN Wieting, Richard, MD      . amiodarone (NEXTERONE PREMIX) 360-4.14 MG/200ML-% (1.8 mg/mL) IV infusion  30 mg/hr Intravenous Continuous Corey Skains, MD 16.7 mL/hr at 09/09/16 0517 30 mg/hr at 09/09/16 0517  . budesonide (PULMICORT) nebulizer solution 0.5 mg  0.5 mg Nebulization BID Loletha Grayer, MD   0.5 mg at 09/09/16 0814  . chlorhexidine gluconate (MEDLINE KIT) (PERIDEX) 0.12 % solution 15 mL  15 mL Mouth Rinse BID Laverle Hobby, MD   15 mL at 09/09/16 0831  . diltiazem (CARDIZEM) tablet 60 mg  60 mg Oral Q8H Laverle Hobby, MD   60 mg at 09/09/16 1427  . EPINEPHrine (ADRENALIN) 4 mg in dextrose 5 % 250 mL (0.016 mg/mL) infusion  0.5-20 mcg/min Intravenous Titrated Darel Hong, MD      .  feeding supplement (VITAL HIGH PROTEIN) liquid 1,000 mL  1,000 mL Per Tube Q24H Laverle Hobby, MD   1,000 mL at 09/09/16 1416  . fentaNYL (SUBLIMAZE) bolus via infusion 50 mcg  50 mcg Intravenous Q30 min PRN Laverle Hobby, MD   50 mcg at 09/05/2016 1811  . fentaNYL 2531mg in NS 2530m(1057mml) infusion-PREMIX  100-300 mcg/hr Intravenous Continuous RamLaverle HobbyD 10 mL/hr at 09/09/16 0619 100 mcg/hr at 09/09/16 0619  . furosemide (LASIX) injection 60 mg  60 mg Intravenous Q8H RamLaverle HobbyD   60 mg at 09/09/16 1427  . heparin ADULT infusion 100 units/mL (25000 units/250m57mdium chloride 0.45%)  1,200 Units/hr Intravenous Continuous RamaLaverle Hobby 12 mL/hr at 09/09/16 0800 1,200 Units/hr at 09/09/16 0800  . insulin aspart (novoLOG) injection 2-6 Units  2-6 Units Subcutaneous Q4H RamaLaverle Hobby   2 Units at 09/09/16 0829  . insulin glargine (LANTUS) injection 10 Units  10 Units Subcutaneous Q24H  BlakAwilda Bill   10 Units at 09/09/16 0513  . ipratropium-albuterol (DUONEB) 0.5-2.5 (3) MG/3ML nebulizer solution 3 mL  3 mL Nebulization Q6H Wieting, Richard, MD   3 mL at 09/09/16 1458  . MEDLINE mouth rinse  15 mL Mouth Rinse 10 times per day RamaLaverle Hobby   15 mL at 09/09/16 1406  . midazolam (VERSED) 50 mg in sodium chloride 0.9 % 50 mL (1 mg/mL) infusion  2-10 mg/hr Intravenous Continuous RamaLaverle Hobby 2 mL/hr at 09/08/16 2023 2 mg/hr at 09/08/16 2023  . midazolam (VERSED) bolus via infusion 2 mg  2 mg Intravenous Q30 min PRN RamaLaverle Hobby      . norepinephrine (LEVOPHED) 4 mg in dextrose 5 % 250 mL (0.016 mg/mL) infusion  0-50 mcg/min Intravenous Titrated RamaLaverle Hobby 18.8 mL/hr at 09/09/16 1400 5 mcg/min at 09/09/16 1400  . pantoprazole sodium (PROTONIX) 40 mg/20 mL oral suspension 40 mg  40 mg Per Tube Daily WietLoletha Grayer   40 mg at 09/09/16 1239  . polyvinyl alcohol (LIQUIFILM TEARS) 1.4 % ophthalmic solution 1 drop  1 drop Both Eyes Q8H PRN RamaLaverle Hobby      . sodium chloride flush (NS) 0.9 % injection 10-40 mL  10-40 mL Intracatheter Q12H RamaLaverle Hobby   10 mL at 09/08/16 2217  . sodium chloride flush (NS) 0.9 % injection 10-40 mL  10-40 mL Intracatheter PRN RamaLaverle Hobby      . valproate (DEPACON) 500 mg in dextrose 5 % 50 mL IVPB  500 mg Intravenous Q8H ZeylLeotis Pain          Allergies: No Known Allergies    Past Medical History: Past Medical History:  Diagnosis Date  . CHF (congestive heart failure) (HCC)[place]. Diabetes mellitus without complication (HCC)[place]. Hypertension      Past Surgical History: Past Surgical History:  Procedure Laterality Date  . NO PAST SURGERIES       Family History: Family History  Problem Relation Age of Onset  . Hypertension Mother   . Hypertension Father   . Diabetes Father      Social History: Social History   Social History   . Marital status: Single    Spouse name: N/A  . Number of children: N/A  . Years of education: N/A   Occupational History  . Not on file.   Social History Main Topics  . Smoking status: Never Smoker  . Smokeless tobacco: Never Used  .  Alcohol use No  . Drug use: No  . Sexual activity: Not on file   Other Topics Concern  . Not on file   Social History Narrative  . No narrative on file     Review of Systems: Review of Systems  Unable to perform ROS: Critical illness    Vital Signs: Blood pressure 113/80, pulse (!) 107, temperature 99.3 F (37.4 C), resp. rate 20, height 6' (1.829 m), weight (!) 191.4 kg (422 lb), SpO2 93 %.  Weight trends: Filed Weights   09/06/2016 0954 09/08/16 1406 09/09/16 0416  Weight: (!) 205.6 kg (453 lb 4.3 oz) (!) 207.7 kg (458 lb) (!) 191.4 kg (422 lb)    Physical Exam: General: Critically ill  Head: ETT   Eyes: Anicteric, PERRL  Neck: Supple, trachea midline  Lungs:  Clear to auscultation  Heart: Regular rate and rhythm  Abdomen:  Soft, nontender,   Extremities: + peripheral edema.  Neurologic: Nonfocal, moving all four extremities  Skin: No lesions  Access: RIJ temp HD catheter 6/13 Dr. Ashby Dawes     Lab results: Basic Metabolic Panel:  Recent Labs Lab 08/29/2016 2036  09/08/16 0152  09/08/16 2227 09/09/16 0209 09/09/16 0431  NA  --   < > 140  < > 139 139 139  K  --   < > 4.1  < > 4.1 4.2 4.2  CL  --   < > 103  < > 103 102 102  CO2  --   < > 29  < > 25 27 27   GLUCOSE  --   < > 134*  < > 145* 133* 133*  BUN  --   < > 36*  < > 42* 45* 46*  CREATININE  --   < > 2.46*  < > 3.05* 3.36* 3.47*  CALCIUM  --   < > 8.4*  < > 7.7* 8.0* 7.9*  MG 2.2  --  2.4  --   --   --  2.3  PHOS  --   --  4.2  --   --   --   --   < > = values in this interval not displayed.  Liver Function Tests:  Recent Labs Lab 09/09/2016 0750  AST 102*  ALT 60  ALKPHOS 93  BILITOT 1.4*  PROT 7.0  ALBUMIN 3.1*   No results for input(s): LIPASE,  AMYLASE in Isaiah last 168 hours. No results for input(s): AMMONIA in Isaiah last 168 hours.  CBC:  Recent Labs Lab 09/03/2016 0750 09/08/16 0152 09/09/16 0431  WBC 15.4* 8.2 12.1*  NEUTROABS 12.6*  --   --   HGB 13.2 13.3 12.8*  HCT 40.7 41.3 39.8*  MCV 80.8 80.5 80.1  PLT 257 178 185    Cardiac Enzymes:  Recent Labs Lab 09/12/2016 0750 09/25/2016 1208 09/06/2016 1632 09/08/16 0152 09/08/16 0532  TROPONINI 0.08* 0.44* 0.47* 0.21* 0.15*    BNP: Invalid input(s): POCBNP  CBG:  Recent Labs Lab 09/08/16 2014 09/08/16 2347 09/09/16 0351 09/09/16 0722 09/09/16 1145  GLUCAP 125* 127* 126* 123* 119*    Microbiology: Results for orders placed or performed during Isaiah hospital encounter of 09/10/2016  Culture, blood (routine x 2)     Status: None (Preliminary result)   Collection Time: 09/17/2016  8:18 AM  Result Value Ref Range Status   Specimen Description BLOOD L AC  Final   Special Requests   Final    BOTTLES DRAWN AEROBIC AND ANAEROBIC Blood Culture adequate volume  Culture NO GROWTH 2 DAYS  Final   Report Status PENDING  Incomplete  MRSA PCR Screening     Status: None   Collection Time: 09/01/2016  9:46 AM  Result Value Ref Range Status   MRSA by PCR NEGATIVE NEGATIVE Final    Comment:        Isaiah GeneXpert MRSA Assay (FDA approved for NASAL specimens only), is one component of a comprehensive MRSA colonization surveillance program. It is not intended to diagnose MRSA infection nor to guide or monitor treatment for MRSA infections.   Culture, blood (routine x 2)     Status: None (Preliminary result)   Collection Time: 09/06/2016 11:45 AM  Result Value Ref Range Status   Specimen Description BLOOD L HAND  Final   Special Requests   Final    BOTTLES DRAWN AEROBIC ONLY Blood Culture results may not be optimal due to an inadequate volume of blood received in culture bottles   Culture NO GROWTH 2 DAYS  Final   Report Status PENDING  Incomplete  Culture, respiratory  (NON-Expectorated)     Status: None (Preliminary result)   Collection Time: 09/22/2016  6:32 PM  Result Value Ref Range Status   Specimen Description TRACHEAL ASPIRATE  Final   Special Requests NONE  Final   Gram Stain   Final    FEW WBC PRESENT, PREDOMINANTLY PMN MODERATE GRAM POSITIVE COCCI IN CHAINS IN PAIRS MODERATE GRAM NEGATIVE RODS    Culture   Final    CULTURE REINCUBATED FOR BETTER GROWTH Performed at [facility], [place] [address] [place], [place] [address]    Report Status PENDING  Incomplete    Coagulation Studies:  Recent Labs  09/17/2016 0750 08/28/2016 1208 09/08/2016 1830  LABPROT 16.6* 16.9* 17.2*  INR 1.33 1.36 1.39    Urinalysis: No results for input(s): COLORURINE, LABSPEC, PHURINE, GLUCOSEU, HGBUR, BILIRUBINUR, KETONESUR, PROTEINUR, UROBILINOGEN, NITRITE, LEUKOCYTESUR in Isaiah last 72 hours.  Invalid input(s): APPERANCEUR    Imaging: Dg Chest 1 View  Result Date: 09/09/2016 CLINICAL DATA:  Dyspnea EXAM: CHEST 1 VIEW COMPARISON:  09/08/2016 FINDINGS: Endotracheal tube and nasogastric catheter are noted in satisfactory position. Left jugular central line is noted although Isaiah tip does not appear to extend into Isaiah innominate vein. A right jugular central line is noted extending into Isaiah right atrium. Mild vascular congestion is seen with mild interstitial edema. No focal infiltrate is seen no pneumothorax is noted. IMPRESSION: Stable changes in Isaiah lungs. New right jugular central line is seen without pneumothorax. Electronically Signed   By: Inez Catalina M.D.   On: 09/09/2016 12:09   Dg Chest 1 View  Addendum Date: 09/08/2016   ADDENDUM REPORT: 09/08/2016 17:27 ADDENDUM: After reviewing Isaiah chest x-ray and chest CT with Dr. Clovis Riley, it appears Isaiah [name] had central line placed after Isaiah 08/29/2016 CT and radiopaque structure arising from Isaiah posterior aspect Isaiah brachiocephalic vein is reflux of contrast into Isaiah superior intercostal vessel. This was discussed  with Isaiah [name]'s nurse Adalberto Ill. Electronically Signed   By: Genia Del M.D.   On: 09/08/2016 17:27   Result Date: 09/08/2016 CLINICAL DATA:  51 year old male with dyspnea. Subsequent encounter. EXAM: CHEST 1 VIEW COMPARISON:  09/22/2016 FINDINGS: Left central line in Isaiah atypical position. On recent chest CT, this appears outside Isaiah left brachycephalic vein coursing over Isaiah aortic arch. This needs to be removed. Endotracheal tube tip 3.2 cm above Isaiah carina. Cardiomegaly. Suspect mild pulmonary edema without significant change. IMPRESSION: Left  central line in Isaiah atypical position. On recent chest CT, this appears outside Isaiah left brachycephalic vein coursing over Isaiah aortic arch. This needs to be removed. Cardiomegaly. Suspect mild pulmonary edema without significant change. These results were called by telephone at Isaiah time of interpretation on 09/08/2016 at 7:04 am to Adalberto Ill, RN who verbally acknowledged these results. Electronically Signed: By: Genia Del M.D. On: 09/08/2016 07:12   Ct Head Wo Contrast  Result Date: 09/09/2016 CLINICAL DATA:  Anoxic encephalopathy. EXAM: CT HEAD WITHOUT CONTRAST TECHNIQUE: Contiguous axial images were obtained from Isaiah base of Isaiah skull through Isaiah vertex without intravenous contrast. COMPARISON:  CT scan of September 07, 2016. FINDINGS: Brain: No midline shift is noted. There is loss of gray-white matter differentiation as well as sulcal effacement involving Isaiah cerebral cortices bilaterally consistent with diffuse cerebral edema, most likely due to anoxic injury. No hemorrhage is noted. Vascular: No hyperdense vessel or unexpected calcification. Skull: Normal. Negative for fracture or focal lesion. Sinuses/Orbits: Multiple small bilateral maxillary mucous retention cysts are noted. Other: None. IMPRESSION: There has been interval loss of gray-white matter differentiation as well as sulcal effacement involving Isaiah cerebral cortices bilaterally consistent  with diffuse cerebral edema, most likely due to anoxic injury. Critical Value/emergent results were called by telephone at Isaiah time of interpretation on 09/09/2016 at 1:45 pm to [place], Isaiah [name]'s nurse, who verbally acknowledged these results and will contact Isaiah ordering physician. Electronically Signed   By: Marijo Conception, M.D.   On: 09/09/2016 13:46   Dg Chest Port 1 View  Result Date: 09/08/2016 CLINICAL DATA:  Central line placement EXAM: PORTABLE CHEST 1 VIEW COMPARISON:  09/08/2016 FINDINGS: Endotracheal tube with Isaiah tip 5.6 cm above Isaiah carina. Nasogastric tube coursing below Isaiah diaphragm. Left central venous catheter with Isaiah tip projecting over Isaiah proximal jugular vein, but is not well delineated secondary to technique. Mild bilateral diffuse interstitial thickening. No pleural effusion or pneumothorax. Stable cardiomegaly. IMPRESSION: 1. Endotracheal tube with Isaiah tip 5.6 cm above Isaiah carina. 2. Left central venous catheter with Isaiah tip projecting over Isaiah proximal jugular vein, but is not well delineated secondary to technique. 3. Cardiomegaly with mild pulmonary vascular congestion. Electronically Signed   By: Kathreen Devoid   On: 09/08/2016 09:50      Assessment & Plan: Isaiah [name] is a 51 y.o. black male with diabetes mellitus type II, hyeprtension, congestive heart failure, atrial fibrillation,  who was admitted to [facility] on 08/31/2016 for Cardiac arrest ([place]) [I46.9] Wheeze [R06.2]  1. Acute renal failure: on chronic kidney disease stage III baseline creatinine of 1.4 04/10/16. Anuric Discussed dialysis with [name]'s family.  - start CRRT: CVVHD 2K bath BFR 350 UF 0  2. Cardiogenic shock: requiring vasopressors: norepinephrine - appreciate cards input  3. Respiratory Failure: intubated and sedate.        LOS: 2 Kinslee Dalpe 6/13/20183:10 PM

## 2016-09-09 NOTE — Progress Notes (Signed)
[facility] Encounter Note  Patient: Isaiah [name] / Admit Date: 09/10/2016 / Date of Encounter: 09/09/2016, 1:15 PM   Subjective: Patient appears to be hemodynamically stable today with   tachycardia and pressor support. The patient had a long run of wide-complex tachycardia consistent with ventricular tachycardia No apparent seizure activity on EEG. Some heart failure and pulmonary edema significantly improved at this time with the diuresis. No hypoxia at this time Echocardiogram shows severe dilation of left ventricle with severe global LV systolic dysfunction with ejection fraction of 10-15% Review of Systems:  Cannot be assessed at this time Objective: Telemetry: Sinus tachycardia Physical Exam: Blood pressure (!) 117/91, pulse (!) 108, temperature 99.7 F (37.6 C), resp. rate 20, height 6' (1.829 m), weight (!) 191.4 kg (422 lb), SpO2 96 %. Body mass index is 57.23 kg/m. General: Well developed, well nourished, intubated. Head: Normocephalic, atraumatic, sclera non-icteric, no xanthomas, nares are without discharge. Neck: No apparent masses Lungs: Normal respirations with diffuse wheezes, no rhonchi, no rales , basilar crackles   Heart: Regular rate and rhythm, normal S1 S2, no murmur, no rub, no gallop, PMI is normal size and placement, carotid upstroke normal without bruit, jugular venous pressure normal Abdomen: Soft,  non-distended with normoactive bowel sounds. No hepatosplenomegaly. Abdominal aorta is normal size without bruit Extremities: 2+ edema, no clubbing, no cyanosis, no ulcers,  Peripheral: 2+ radial, 2+ femoral, 2+ dorsal pedal pulses Neuro: Not Alert and oriented. Does not Moves all extremities spontaneously. Psych:  Does not Responds to questions appropriately with a normal affect.   Intake/Output Summary (Last 24 hours) at 09/09/16 1315 Last data filed at 09/09/16 1300  Gross per 24 hour  Intake          2836.03 ml  Output              137  ml  Net          2699.03 ml    Inpatient Medications:  . budesonide (PULMICORT) nebulizer solution  0.5 mg Nebulization BID  . chlorhexidine gluconate (MEDLINE KIT)  15 mL Mouth Rinse BID  . diltiazem  60 mg Oral Q8H  . feeding supplement (VITAL HIGH PROTEIN)  1,000 mL Per Tube Q24H  . furosemide  60 mg Intravenous Q8H  . insulin aspart  2-6 Units Subcutaneous Q4H  . insulin glargine  10 Units Subcutaneous Q24H  . ipratropium-albuterol  3 mL Nebulization Q6H  . mouth rinse  15 mL Mouth Rinse 10 times per day  . pantoprazole sodium  40 mg Per Tube Daily  . sodium chloride flush  10-40 mL Intracatheter Q12H   Infusions:  . sodium chloride 10 mL/hr at 09/09/16 0850  . amiodarone 30 mg/hr (09/09/16 0517)  . epinephrine    . fentaNYL infusion INTRAVENOUS 100 mcg/hr (09/09/16 0619)  . heparin 1,200 Units/hr (09/09/16 0800)  . midazolam (VERSED) infusion 2 mg/hr (09/08/16 2023)  . norepinephrine (LEVOPHED) Adult infusion 5 mcg/min (09/09/16 1300)  . valproate sodium    . valproate sodium      Labs:  Recent Labs  09/08/16 0152  09/09/16 0209 09/09/16 0431  NA 140  < > 139 139  K 4.1  < > 4.2 4.2  CL 103  < > 102 102  CO2 29  < > 27 27  GLUCOSE 134*  < > 133* 133*  BUN 36*  < > 45* 46*  CREATININE 2.46*  < > 3.36* 3.47*  CALCIUM 8.4*  < > 8.0* 7.9*  MG 2.4  --   --  2.3  PHOS 4.2  --   --   --   < > = values in this interval not displayed.  Recent Labs  09/18/2016 0750  AST 102*  ALT 60  ALKPHOS 93  BILITOT 1.4*  PROT 7.0  ALBUMIN 3.1*    Recent Labs  09/02/2016 0750 09/08/16 0152 09/09/16 0431  WBC 15.4* 8.2 12.1*  NEUTROABS 12.6*  --   --   HGB 13.2 13.3 12.8*  HCT 40.7 41.3 39.8*  MCV 80.8 80.5 80.1  PLT 257 178 185    Recent Labs  09/24/2016 1208 09/18/2016 1632 09/08/16 0152 09/08/16 0532  TROPONINI 0.44* 0.47* 0.21* 0.15*   Invalid input(s): POCBNP  Recent Labs  09/08/2016 1210  HGBA1C 6.2*     Weights: Filed Weights   09/08/2016 0954  09/08/16 1406 09/09/16 0416  Weight: (!) 205.6 kg (453 lb 4.3 oz) (!) 207.7 kg (458 lb) (!) 191.4 kg (422 lb)     Radiology/Studies:  Dg Chest 1 View  Result Date: 09/09/2016 CLINICAL DATA:  Dyspnea EXAM: CHEST 1 VIEW COMPARISON:  09/08/2016 FINDINGS: Endotracheal tube and nasogastric catheter are noted in satisfactory position. Left jugular central line is noted although the tip does not appear to extend into the innominate vein. A right jugular central line is noted extending into the right atrium. Mild vascular congestion is seen with mild interstitial edema. No focal infiltrate is seen no pneumothorax is noted. IMPRESSION: Stable changes in the lungs. New right jugular central line is seen without pneumothorax. Electronically Signed   By: Inez Catalina M.D.   On: 09/09/2016 12:09   Dg Chest 1 View  Addendum Date: 09/08/2016   ADDENDUM REPORT: 09/08/2016 17:27 ADDENDUM: After reviewing the chest x-ray and chest CT with Dr. Clovis Riley, it appears the patient had central line placed after the 08/28/2016 CT and radiopaque structure arising from the posterior aspect the brachiocephalic vein is reflux of contrast into the superior intercostal vessel. This was discussed with the patient's nurse Adalberto Ill. Electronically Signed   By: Genia Del M.D.   On: 09/08/2016 17:27   Result Date: 09/08/2016 CLINICAL DATA:  51 year old male with dyspnea. Subsequent encounter. EXAM: CHEST 1 VIEW COMPARISON:  09/15/2016 FINDINGS: Left central line in the atypical position. On recent chest CT, this appears outside the left brachycephalic vein coursing over the aortic arch. This needs to be removed. Endotracheal tube tip 3.2 cm above the carina. Cardiomegaly. Suspect mild pulmonary edema without significant change. IMPRESSION: Left central line in the atypical position. On recent chest CT, this appears outside the left brachycephalic vein coursing over the aortic arch. This needs to be removed. Cardiomegaly. Suspect mild  pulmonary edema without significant change. These results were called by telephone at the time of interpretation on 09/08/2016 at 7:04 am to Adalberto Ill, RN who verbally acknowledged these results. Electronically Signed: By: Genia Del M.D. On: 09/08/2016 07:12   Ct Head Wo Contrast  Result Date: 09/11/2016 CLINICAL DATA:  Cardiac arrest. EXAM: CT HEAD WITHOUT CONTRAST TECHNIQUE: Contiguous axial images were obtained from the base of the skull through the vertex without intravenous contrast. COMPARISON:  None. FINDINGS: Brain: No evidence of acute infarction, hemorrhage, hydrocephalus, extra-axial collection or mass lesion/mass effect. Vascular: No hyperdense vessel or unexpected calcification. Skull: Normal. Negative for fracture or focal lesion. Sinuses/Orbits: Scattered areas of mucosal disease in the maxillary sinuses. Minimal sinus disease in the sphenoid sinuses. Other: None. IMPRESSION: No acute intracranial abnormality.  Paranasal sinus disease. Electronically Signed   By: Markus Daft M.D.   On: 08/29/2016 09:43   Ct Angio Chest Pe W/cm &/or Wo Cm  Result Date: 09/06/2016 CLINICAL DATA:  Cardiac arrest. EXAM: CT ANGIOGRAPHY CHEST WITH CONTRAST TECHNIQUE: Multidetector CT imaging of the chest was performed using the standard protocol during bolus administration of intravenous contrast. Multiplanar CT image reconstructions and MIPs were obtained to evaluate the vascular anatomy. CONTRAST:  75 cc of Isovue 370 COMPARISON:  None FINDINGS: Cardiovascular: The heart size is enlarged. There does not appear to be evidence of right heart strain. No definite evidence for embolus within the main pulmonary artery or the lobar branches. Examination is nondiagnostic for the segmental, and subsegmental branches. Mediastinum/Nodes: ET tube tip is above the carina. The nasogastric tube with tip in the stomach. Study is very difficult to determine whether not there are any enlarged mediastinal or hilar lymph nodes. No  axillary or supraclavicular adenopathy. Lungs/Pleura: No pleural effusion. Bilateral upper and lower lobe posterior predominant atelectasis and subpleural consolidation. Upper Abdomen: No acute abnormality. Musculoskeletal: There is a fracture through the mid body of the sternum, image 54 of series 9. Fracture involving the posterior aspect of the left third rib noted. Review of the MIP images confirms the above findings. IMPRESSION: 1. Markedly diminished exam detail secondary to patient's body habitus. Patient reportedly weighs greater than 500 pounds. 2. No evidence for pulmonary embolus in the main pulmonary artery or the lobar branches. The exam is nondiagnostic for segmental and subsegmental branches. 3. Sternal fracture.  Left posterior third rib fracture. 4. Bilateral posterior atelectasis and subpleural consolidation. Electronically Signed   By: Kerby Moors M.D.   On: 09/12/2016 09:57   Dg Chest Port 1 View  Result Date: 09/08/2016 CLINICAL DATA:  Central line placement EXAM: PORTABLE CHEST 1 VIEW COMPARISON:  09/08/2016 FINDINGS: Endotracheal tube with the tip 5.6 cm above the carina. Nasogastric tube coursing below the diaphragm. Left central venous catheter with the tip projecting over the proximal jugular vein, but is not well delineated secondary to technique. Mild bilateral diffuse interstitial thickening. No pleural effusion or pneumothorax. Stable cardiomegaly. IMPRESSION: 1. Endotracheal tube with the tip 5.6 cm above the carina. 2. Left central venous catheter with the tip projecting over the proximal jugular vein, but is not well delineated secondary to technique. 3. Cardiomegaly with mild pulmonary vascular congestion. Electronically Signed   By: Kathreen Devoid   On: 09/08/2016 09:50   Dg Chest Port 1 View  Result Date: 09/10/2016 CLINICAL DATA:  Line placement after cardiac arrest today. EXAM: PORTABLE CHEST 1 VIEW COMPARISON:  CT chest and chest radiograph 09/10/2016. FINDINGS:  Endotracheal tube terminates 4.6 cm above the carina. Left IJ central line tip is poorly visualized due to body habitus. It is followed into the left brachiocephalic region but is difficult to follow any further. Nasogastric tube is followed into the distal esophagus but again, is difficult to follow further due to body habitus. Defibrillator pad is seen over the left chest. Heart is enlarged. Lungs are somewhat low in volume with mild interstitial prominence and indistinctness. No definite pleural fluid. No pneumothorax. IMPRESSION: 1. Body habitus is limiting and degrades image quality. 2. Left IJ central line tip is followed into the left brachiocephalic vein but is difficult to see beyond that due to body habitus. 3. Suspect mild pulmonary edema. Electronically Signed   By: Lorin Picket M.D.   On: 09/16/2016 11:31   Dg Chest [facility]  Result Date: 08/31/2016 CLINICAL DATA:  Hypoxia.  Status post cardiac arrest EXAM: PORTABLE CHEST 1 VIEW COMPARISON:  None. FINDINGS: Endotracheal tube tip is 3.5 cm above the carina. Nasogastric to tip and side port are below the diaphragm. No pneumothorax. There is cardiomegaly with pulmonary venous hypertension. There is slight interstitial edema. There is no appreciable airspace consolidation. No adenopathy evident. No bone lesions. IMPRESSION: Tube positions as described without pneumothorax. Evidence a degree of congestive heart failure without consolidation or volume loss. Electronically Signed   By: Lowella Grip III M.D.   On: 09/05/2016 08:33     Assessment and Recommendation  51 y.o. male with known diabetes with complication essential hypertension mixed hyperlipidemia and chronic nonvalvular atrial fibrillation with obesity and severe sleep apnea with the cardiac arrest and no current evidence of the acute coronary syndrome due to no significant EKG changes or elevation of troponin. Echocardiogram suggested patient has had sleep apnea atrial  fibrillation as a primary cause of cardiomyopathy likely causing her rhythm disturbance and heart failure with acute arrest. Now with ventricular tachycardia by monitor the patient may have had full arrest due to V. tach and sudden death 1. Continue supportive care with oxygenation and pressure support 2. Heart rate control with amiodarone if able 3. Furosemide for pulmonary edema as needed 4. Begin warming and now follow for improvements neurologically and consider further intervention depending on outcomes of patient  Signed, Serafina Royals M.D. FACC

## 2016-09-09 NOTE — Progress Notes (Signed)
RN spoke with Dr. Nicholos Johnsamachandran and made MD aware that patient is having mouth and eye twitches that occur simultaneously.  MD came to bedside and assessed patient and stated he would let the neuro doctor know.  MD instructed RN to keep the versed and fentanyl drips running.

## 2016-09-09 NOTE — Procedures (Signed)
Central Venous Dailysis Catheter Placement: Indication: Hemo Dialysis/CRRT   Consent:emergent  Risks and benefits explained in detail including risk of infection, bleeding, respiratory failure and death..   Hand washing performed prior to starting the procedure.   Procedure: An active timeout was performed and correct patient, name, & ID confirmed.  After explaining risk and benefits, patient was positioned correctly for central venous access. Patient was prepped using strict sterile technique including chlorohexadine preps, sterile drape, sterile gown and sterile gloves.  The area was prepped, draped and anesthetized in the usual sterile manner. Patient comfort was obtained.  A triple lumen catheter was placed in right IJ Vein There was good blood return, catheter caps were placed on lumens, catheter flushed easily, the line was secured and a sterile dressing and BIO-PATCH applied.   Ultrasound was used to visualize vasculature and guidance of needle.   Number of Attempts: 1 Complications:none  Estimated Blood Loss: none Chest Radiograph indicated and ordered.     Wells Guileseep Neddie Steedman, M.D.  09/09/2016

## 2016-09-09 NOTE — Progress Notes (Signed)
Dr. Nicholos Johnsamachandran informed this RN that he spoke with Dr. Loman ChromanZeyilkman regarding mouth and eye twitches and MD gave RN order for Vimpat 400 mg IV once.

## 2016-09-09 NOTE — Progress Notes (Signed)
Called [facility] to ensure a referral has been called. Referral was called 09/08/2016.   Referral # [id number]

## 2016-09-10 DIAGNOSIS — Z515 Encounter for palliative care: Secondary | ICD-10-CM

## 2016-09-10 DIAGNOSIS — I5084 End stage heart failure: Secondary | ICD-10-CM

## 2016-09-10 DIAGNOSIS — I6782 Cerebral ischemia: Secondary | ICD-10-CM

## 2016-09-10 DIAGNOSIS — G931 Anoxic brain damage, not elsewhere classified: Secondary | ICD-10-CM

## 2016-09-10 LAB — MAGNESIUM
MAGNESIUM: 1.9 mg/dL (ref 1.7–2.4)
MAGNESIUM: 1.9 mg/dL (ref 1.7–2.4)
MAGNESIUM: 2 mg/dL (ref 1.7–2.4)
MAGNESIUM: 2.2 mg/dL (ref 1.7–2.4)
Magnesium: 2.3 mg/dL (ref 1.7–2.4)
Magnesium: 2.1 mg/dL (ref 1.7–2.4)

## 2016-09-10 LAB — RENAL FUNCTION PANEL
ALBUMIN: 3 g/dL — AB (ref 3.5–5.0)
ALBUMIN: 3 g/dL — AB (ref 3.5–5.0)
ALBUMIN: 2.8 g/dL — AB (ref 3.5–5.0)
ANION GAP: 8 (ref 5–15)
ANION GAP: 8 (ref 5–15)
ANION GAP: 9 (ref 5–15)
Albumin: 3 g/dL — ABNORMAL LOW (ref 3.5–5.0)
Albumin: 3 g/dL — ABNORMAL LOW (ref 3.5–5.0)
Albumin: 2.8 g/dL — ABNORMAL LOW (ref 3.5–5.0)
Anion gap: 11 (ref 5–15)
Anion gap: 7 (ref 5–15)
Anion gap: 7 (ref 5–15)
BUN: 44 mg/dL — ABNORMAL HIGH (ref 6–20)
BUN: 46 mg/dL — AB (ref 6–20)
BUN: 47 mg/dL — ABNORMAL HIGH (ref 6–20)
BUN: 48 mg/dL — AB (ref 6–20)
BUN: 49 mg/dL — ABNORMAL HIGH (ref 6–20)
BUN: 50 mg/dL — ABNORMAL HIGH (ref 6–20)
CALCIUM: 7.7 mg/dL — AB (ref 8.9–10.3)
CALCIUM: 7.7 mg/dL — AB (ref 8.9–10.3)
CALCIUM: 7.8 mg/dL — AB (ref 8.9–10.3)
CHLORIDE: 101 mmol/L (ref 101–111)
CHLORIDE: 102 mmol/L (ref 101–111)
CHLORIDE: 103 mmol/L (ref 101–111)
CO2: 25 mmol/L (ref 22–32)
CO2: 27 mmol/L (ref 22–32)
CO2: 27 mmol/L (ref 22–32)
CO2: 28 mmol/L (ref 22–32)
CO2: 28 mmol/L (ref 22–32)
CO2: 29 mmol/L (ref 22–32)
CREATININE: 3.47 mg/dL — AB (ref 0.61–1.24)
CREATININE: 3.45 mg/dL — AB (ref 0.61–1.24)
CREATININE: 3.28 mg/dL — AB (ref 0.61–1.24)
Calcium: 7.8 mg/dL — ABNORMAL LOW (ref 8.9–10.3)
Calcium: 7.7 mg/dL — ABNORMAL LOW (ref 8.9–10.3)
Calcium: 7.5 mg/dL — ABNORMAL LOW (ref 8.9–10.3)
Chloride: 101 mmol/L (ref 101–111)
Chloride: 100 mmol/L — ABNORMAL LOW (ref 101–111)
Chloride: 100 mmol/L — ABNORMAL LOW (ref 101–111)
Creatinine, Ser: 3.79 mg/dL — ABNORMAL HIGH (ref 0.61–1.24)
Creatinine, Ser: 3.45 mg/dL — ABNORMAL HIGH (ref 0.61–1.24)
Creatinine, Ser: 3.37 mg/dL — ABNORMAL HIGH (ref 0.61–1.24)
GFR calc Af Amer: 20 mL/min — ABNORMAL LOW (ref >60)
GFR calc Af Amer: 23 mL/min — ABNORMAL LOW (ref >60)
GFR calc Af Amer: 23 mL/min — ABNORMAL LOW (ref >60)
GFR calc non Af Amer: 17 mL/min — ABNORMAL LOW (ref >60)
GFR calc non Af Amer: 19 mL/min — ABNORMAL LOW (ref >60)
GFR calc non Af Amer: 20 mL/min — ABNORMAL LOW (ref >60)
GFR calc non Af Amer: 21 mL/min — ABNORMAL LOW (ref >60)
GFR, EST AFRICAN AMERICAN: 22 mL/min — AB (ref >60)
GFR, EST AFRICAN AMERICAN: 23 mL/min — AB (ref >60)
GFR, EST AFRICAN AMERICAN: 24 mL/min — AB (ref >60)
GFR, EST NON AFRICAN AMERICAN: 20 mL/min — AB (ref >60)
GFR, EST NON AFRICAN AMERICAN: 20 mL/min — AB (ref >60)
GLUCOSE: 128 mg/dL — AB (ref 65–99)
Glucose, Bld: 137 mg/dL — ABNORMAL HIGH (ref 65–99)
Glucose, Bld: 132 mg/dL — ABNORMAL HIGH (ref 65–99)
Glucose, Bld: 160 mg/dL — ABNORMAL HIGH (ref 65–99)
Glucose, Bld: 135 mg/dL — ABNORMAL HIGH (ref 65–99)
Glucose, Bld: 167 mg/dL — ABNORMAL HIGH (ref 65–99)
PHOSPHORUS: 5.8 mg/dL — AB (ref 2.5–4.6)
PHOSPHORUS: 5.1 mg/dL — AB (ref 2.5–4.6)
PHOSPHORUS: 5.1 mg/dL — AB (ref 2.5–4.6)
POTASSIUM: 3.9 mmol/L (ref 3.5–5.1)
POTASSIUM: 4 mmol/L (ref 3.5–5.1)
POTASSIUM: 3.9 mmol/L (ref 3.5–5.1)
Phosphorus: 6.5 mg/dL — ABNORMAL HIGH (ref 2.5–4.6)
Phosphorus: 5.8 mg/dL — ABNORMAL HIGH (ref 2.5–4.6)
Phosphorus: 5.2 mg/dL — ABNORMAL HIGH (ref 2.5–4.6)
Potassium: 3.9 mmol/L (ref 3.5–5.1)
Potassium: 4.1 mmol/L (ref 3.5–5.1)
Potassium: 3.8 mmol/L (ref 3.5–5.1)
SODIUM: 137 mmol/L (ref 135–145)
SODIUM: 136 mmol/L (ref 135–145)
SODIUM: 136 mmol/L (ref 135–145)
Sodium: 137 mmol/L (ref 135–145)
Sodium: 138 mmol/L (ref 135–145)
Sodium: 137 mmol/L (ref 135–145)

## 2016-09-10 LAB — APTT
APTT: 63 s — AB (ref 24–36)
aPTT: 66 seconds — ABNORMAL HIGH (ref 24–36)
aPTT: 59 seconds — ABNORMAL HIGH (ref 24–36)

## 2016-09-10 LAB — CULTURE, RESPIRATORY: CULTURE: NORMAL

## 2016-09-10 LAB — GLUCOSE, CAPILLARY
GLUCOSE-CAPILLARY: 125 mg/dL — AB (ref 65–99)
GLUCOSE-CAPILLARY: 109 mg/dL — AB (ref 65–99)
Glucose-Capillary: 119 mg/dL — ABNORMAL HIGH (ref 65–99)
Glucose-Capillary: 127 mg/dL — ABNORMAL HIGH (ref 65–99)
Glucose-Capillary: 133 mg/dL — ABNORMAL HIGH (ref 65–99)
Glucose-Capillary: 154 mg/dL — ABNORMAL HIGH (ref 65–99)

## 2016-09-10 LAB — TRIGLYCERIDES: Triglycerides: 37 mg/dL (ref <150)

## 2016-09-10 LAB — CULTURE, RESPIRATORY W GRAM STAIN

## 2016-09-10 LAB — HEPARIN LEVEL (UNFRACTIONATED): Heparin Unfractionated: 0.85 IU/mL — ABNORMAL HIGH (ref 0.30–0.70)

## 2016-09-10 LAB — VALPROIC ACID LEVEL: Valproic Acid Lvl: 34 ug/mL — ABNORMAL LOW (ref 50.0–100.0)

## 2016-09-10 MED ORDER — VITAL HIGH PROTEIN PO LIQD
1000.0000 mL | ORAL | Status: DC
  Administered 2016-09-10: 1000 mL
  Administered 2016-09-11: 10:00:00
  Administered 2016-09-11: 1000 mL

## 2016-09-10 MED ORDER — VECURONIUM BROMIDE 10 MG IV SOLR
10.0000 mg | INTRAVENOUS | Status: AC
  Administered 2016-09-10: 10 mg via INTRAVENOUS
  Filled 2016-09-10: qty 10

## 2016-09-10 MED ORDER — DOCUSATE SODIUM 50 MG/5ML PO LIQD
100.0000 mg | Freq: Two times a day (BID) | ORAL | Status: DC
  Administered 2016-09-10 – 2016-09-13 (×6): 100 mg via ORAL
  Filled 2016-09-10 (×6): qty 10

## 2016-09-10 MED ORDER — SODIUM CHLORIDE 0.9 % IV SOLN
0.0000 ug/min | INTRAVENOUS | Status: DC
  Administered 2016-09-10: 20 ug/min via INTRAVENOUS
  Filled 2016-09-10: qty 4

## 2016-09-10 MED ORDER — VECURONIUM BROMIDE 10 MG IV SOLR
10.0000 mg | Freq: Once | INTRAVENOUS | Status: AC
  Administered 2016-09-10: 10 mg via INTRAVENOUS
  Filled 2016-09-10: qty 10

## 2016-09-10 MED ORDER — STERILE WATER FOR INJECTION IJ SOLN
INTRAMUSCULAR | Status: AC
  Administered 2016-09-10: 10 mL
  Filled 2016-09-10: qty 10

## 2016-09-10 MED ORDER — SENNOSIDES 8.8 MG/5ML PO SYRP
5.0000 mL | ORAL_SOLUTION | Freq: Two times a day (BID) | ORAL | Status: DC
  Administered 2016-09-10 – 2016-09-13 (×6): 5 mL via ORAL
  Filled 2016-09-10 (×6): qty 5

## 2016-09-10 MED ORDER — VECURONIUM BOLUS VIA INFUSION
10.0000 mg | Freq: Once | INTRAVENOUS | Status: DC
  Filled 2016-09-10: qty 10

## 2016-09-10 MED ORDER — PROPOFOL 1000 MG/100ML IV EMUL
5.0000 ug/kg/min | INTRAVENOUS | Status: DC
  Administered 2016-09-10 (×2): 20 ug/kg/min via INTRAVENOUS
  Administered 2016-09-10: 10 ug/kg/min via INTRAVENOUS
  Administered 2016-09-11 (×2): 15 ug/kg/min via INTRAVENOUS
  Filled 2016-09-10 (×6): qty 100

## 2016-09-10 MED ORDER — MAGNESIUM SULFATE IN D5W 1-5 GM/100ML-% IV SOLN
1.0000 g | Freq: Once | INTRAVENOUS | Status: AC
  Administered 2016-09-10: 1 g via INTRAVENOUS
  Filled 2016-09-10: qty 100

## 2016-09-10 MED ORDER — BISACODYL 10 MG RE SUPP: 10.0000 mg | Freq: Once | RECTAL | Status: DC

## 2016-09-10 MED ORDER — AMIODARONE IV BOLUS ONLY 150 MG/100ML
150.0000 mg | Freq: Once | INTRAVENOUS | Status: AC
  Administered 2016-09-10: 150 mg via INTRAVENOUS
  Filled 2016-09-10: qty 100

## 2016-09-10 NOTE — Progress Notes (Signed)
[facility] Encounter Note  Patient: Isaiah [name] / Admit Date: 09/17/2016 / Date of Encounter: 09/10/2016, 8:22 AM   Subjective:  Patient has had continued evidence of atrial fibrillation with rapid ventricular rate at this time but continues to be relatively hemodynamically stable with current medical regimen. Patient has been warmed and there is no neurologic response at this time. No evidence of further episodes of ventricular tachycardia Echocardiogram shows severe dilation of left ventricle with severe global LV systolic dysfunction with ejection fraction of 10-15% Review of Systems:  Cannot be assessed at this time Objective: Telemetry: Atrial fibrillation with controlled ventricular rate Physical Exam: Blood pressure 105/60, pulse (!) 131, temperature 98.8 F (37.1 C), resp. rate (!) 21, height 6' (1.829 m), weight (!) 216.8 kg (478 lb), SpO2 95 %. Body mass index is 64.83 kg/m. General: Well developed, well nourished, intubated. Head: Normocephalic, atraumatic, sclera non-icteric, no xanthomas, nares are without discharge. Neck: No apparent masses Lungs: Normal respirations with diffuse wheezes, no rhonchi, no rales , basilar crackles   Heart: Regular rate and rhythm, normal S1 S2, no murmur, no rub, no gallop, PMI is normal size and placement, carotid upstroke normal without bruit, jugular venous pressure normal Abdomen: Soft,  non-distended with normoactive bowel sounds. No hepatosplenomegaly. Abdominal aorta is normal size without bruit Extremities: 2+ edema, no clubbing, no cyanosis, no ulcers,  Peripheral: 2+ radial, 2+ femoral, 2+ dorsal pedal pulses Neuro: Not Alert and oriented. Does not Moves all extremities spontaneously. Psych:  Does not Responds to questions appropriately with a normal affect.   Intake/Output Summary (Last 24 hours) at 09/10/16 9562 Last data filed at 09/10/16 0800  Gross per 24 hour  Intake          3134.71 ml  Output              1075 ml  Net          2059.71 ml    Inpatient Medications:  . budesonide (PULMICORT) nebulizer solution  0.5 mg Nebulization BID  . chlorhexidine gluconate (MEDLINE KIT)  15 mL Mouth Rinse BID  . diltiazem  60 mg Oral Q8H  . feeding supplement (VITAL HIGH PROTEIN)  1,000 mL Per Tube Q24H  . furosemide  60 mg Intravenous Q8H  . insulin aspart  2-6 Units Subcutaneous Q4H  . insulin glargine  10 Units Subcutaneous Q24H  . ipratropium-albuterol  3 mL Nebulization Q6H  . mouth rinse  15 mL Mouth Rinse 10 times per day  . pantoprazole sodium  40 mg Per Tube Daily  . sodium chloride flush  10-40 mL Intracatheter Q12H   Infusions:  . sodium chloride 10 mL/hr at 09/09/16 2000  . amiodarone 30 mg/hr (09/10/16 0745)  . epinephrine    . fentaNYL infusion INTRAVENOUS 150 mcg/hr (09/09/16 2148)  . heparin 1,400 Units/hr (09/10/16 0248)  . midazolam (VERSED) infusion 4 mg/hr (09/09/16 2152)  . norepinephrine (LEVOPHED) Adult infusion Stopped (09/09/16 1803)  . pureflow 3 each (09/10/16 0819)  . valproate sodium Stopped (09/10/16 0630)    Labs:  Recent Labs  09/10/16 0030 09/10/16 0038 09/10/16 0424  NA  --  137 137  K  --  3.9 3.9  CL  --  101 101  CO2  --  27 28  GLUCOSE  --  137* 132*  BUN  --  50* 48*  CREATININE  --  3.79* 3.45*  CALCIUM  --  7.7* 7.7*  MG 2.2  --  2.3  PHOS  --  6.5* 5.8*    Recent Labs  09/10/16 0038 09/10/16 0424  ALBUMIN 3.0* 3.0*    Recent Labs  09/08/16 0152 09/09/16 0431  WBC 8.2 12.1*  HGB 13.3 12.8*  HCT 41.3 39.8*  MCV 80.5 80.1  PLT 178 185    Recent Labs  09/20/2016 1208 09/22/2016 1632 09/08/16 0152 09/08/16 0532  TROPONINI 0.44* 0.47* 0.21* 0.15*   Invalid input(s): POCBNP  Recent Labs  08/30/2016 1210  HGBA1C 6.2*     Weights: Filed Weights   09/08/16 1406 09/09/16 0416 09/10/16 0400  Weight: (!) 207.7 kg (458 lb) (!) 191.4 kg (422 lb) (!) 216.8 kg (478 lb)     Radiology/Studies:  Dg Chest 1 View  Result  Date: 09/09/2016 CLINICAL DATA:  Dyspnea EXAM: CHEST 1 VIEW COMPARISON:  09/08/2016 FINDINGS: Endotracheal tube and nasogastric catheter are noted in satisfactory position. Left jugular central line is noted although the tip does not appear to extend into the innominate vein. A right jugular central line is noted extending into the right atrium. Mild vascular congestion is seen with mild interstitial edema. No focal infiltrate is seen no pneumothorax is noted. IMPRESSION: Stable changes in the lungs. New right jugular central line is seen without pneumothorax. Electronically Signed   By: Inez Catalina M.D.   On: 09/09/2016 12:09   Dg Chest 1 View  Addendum Date: 09/08/2016   ADDENDUM REPORT: 09/08/2016 17:27 ADDENDUM: After reviewing the chest x-ray and chest CT with Dr. Clovis Riley, it appears the patient had central line placed after the 08/30/2016 CT and radiopaque structure arising from the posterior aspect the brachiocephalic vein is reflux of contrast into the superior intercostal vessel. This was discussed with the patient's nurse Adalberto Ill. Electronically Signed   By: Genia Del M.D.   On: 09/08/2016 17:27   Result Date: 09/08/2016 CLINICAL DATA:  51 year old male with dyspnea. Subsequent encounter. EXAM: CHEST 1 VIEW COMPARISON:  09/08/2016 FINDINGS: Left central line in the atypical position. On recent chest CT, this appears outside the left brachycephalic vein coursing over the aortic arch. This needs to be removed. Endotracheal tube tip 3.2 cm above the carina. Cardiomegaly. Suspect mild pulmonary edema without significant change. IMPRESSION: Left central line in the atypical position. On recent chest CT, this appears outside the left brachycephalic vein coursing over the aortic arch. This needs to be removed. Cardiomegaly. Suspect mild pulmonary edema without significant change. These results were called by telephone at the time of interpretation on 09/08/2016 at 7:04 am to Adalberto Ill, RN who  verbally acknowledged these results. Electronically Signed: By: Genia Del M.D. On: 09/08/2016 07:12   Ct Head Wo Contrast  Result Date: 09/09/2016 CLINICAL DATA:  Anoxic encephalopathy. EXAM: CT HEAD WITHOUT CONTRAST TECHNIQUE: Contiguous axial images were obtained from the base of the skull through the vertex without intravenous contrast. COMPARISON:  CT scan of September 07, 2016. FINDINGS: Brain: No midline shift is noted. There is loss of gray-white matter differentiation as well as sulcal effacement involving the cerebral cortices bilaterally consistent with diffuse cerebral edema, most likely due to anoxic injury. No hemorrhage is noted. Vascular: No hyperdense vessel or unexpected calcification. Skull: Normal. Negative for fracture or focal lesion. Sinuses/Orbits: Multiple small bilateral maxillary mucous retention cysts are noted. Other: None. IMPRESSION: There has been interval loss of gray-white matter differentiation as well as sulcal effacement involving the cerebral cortices bilaterally consistent with diffuse cerebral edema, most likely due to anoxic injury. Critical Value/emergent results were called by telephone at the  time of interpretation on 09/09/2016 at 1:45 pm to [place], the patient's nurse, who verbally acknowledged these results and will contact the ordering physician. Electronically Signed   By: Marijo Conception, M.D.   On: 09/09/2016 13:46   Ct Head Wo Contrast  Result Date: 09/08/2016 CLINICAL DATA:  Cardiac arrest. EXAM: CT HEAD WITHOUT CONTRAST TECHNIQUE: Contiguous axial images were obtained from the base of the skull through the vertex without intravenous contrast. COMPARISON:  None. FINDINGS: Brain: No evidence of acute infarction, hemorrhage, hydrocephalus, extra-axial collection or mass lesion/mass effect. Vascular: No hyperdense vessel or unexpected calcification. Skull: Normal. Negative for fracture or focal lesion. Sinuses/Orbits: Scattered areas of mucosal disease in  the maxillary sinuses. Minimal sinus disease in the sphenoid sinuses. Other: None. IMPRESSION: No acute intracranial abnormality. Paranasal sinus disease. Electronically Signed   By: Markus Daft M.D.   On: 09/13/2016 09:43   Ct Angio Chest Pe W/cm &/or Wo Cm  Result Date: 08/29/2016 CLINICAL DATA:  Cardiac arrest. EXAM: CT ANGIOGRAPHY CHEST WITH CONTRAST TECHNIQUE: Multidetector CT imaging of the chest was performed using the standard protocol during bolus administration of intravenous contrast. Multiplanar CT image reconstructions and MIPs were obtained to evaluate the vascular anatomy. CONTRAST:  75 cc of Isovue 370 COMPARISON:  None FINDINGS: Cardiovascular: The heart size is enlarged. There does not appear to be evidence of right heart strain. No definite evidence for embolus within the main pulmonary artery or the lobar branches. Examination is nondiagnostic for the segmental, and subsegmental branches. Mediastinum/Nodes: ET tube tip is above the carina. The nasogastric tube with tip in the stomach. Study is very difficult to determine whether not there are any enlarged mediastinal or hilar lymph nodes. No axillary or supraclavicular adenopathy. Lungs/Pleura: No pleural effusion. Bilateral upper and lower lobe posterior predominant atelectasis and subpleural consolidation. Upper Abdomen: No acute abnormality. Musculoskeletal: There is a fracture through the mid body of the sternum, image 54 of series 9. Fracture involving the posterior aspect of the left third rib noted. Review of the MIP images confirms the above findings. IMPRESSION: 1. Markedly diminished exam detail secondary to patient's body habitus. Patient reportedly weighs greater than 500 pounds. 2. No evidence for pulmonary embolus in the main pulmonary artery or the lobar branches. The exam is nondiagnostic for segmental and subsegmental branches. 3. Sternal fracture.  Left posterior third rib fracture. 4. Bilateral posterior atelectasis and  subpleural consolidation. Electronically Signed   By: Kerby Moors M.D.   On: 09/08/2016 09:57   Dg Chest Port 1 View  Result Date: 09/08/2016 CLINICAL DATA:  Central line placement EXAM: PORTABLE CHEST 1 VIEW COMPARISON:  09/08/2016 FINDINGS: Endotracheal tube with the tip 5.6 cm above the carina. Nasogastric tube coursing below the diaphragm. Left central venous catheter with the tip projecting over the proximal jugular vein, but is not well delineated secondary to technique. Mild bilateral diffuse interstitial thickening. No pleural effusion or pneumothorax. Stable cardiomegaly. IMPRESSION: 1. Endotracheal tube with the tip 5.6 cm above the carina. 2. Left central venous catheter with the tip projecting over the proximal jugular vein, but is not well delineated secondary to technique. 3. Cardiomegaly with mild pulmonary vascular congestion. Electronically Signed   By: Kathreen Devoid   On: 09/08/2016 09:50   Dg Chest Port 1 View  Result Date: 09/08/2016 CLINICAL DATA:  Line placement after cardiac arrest today. EXAM: PORTABLE CHEST 1 VIEW COMPARISON:  CT chest and chest radiograph 09/18/2016. FINDINGS: Endotracheal tube terminates 4.6 cm above the carina.  Left IJ central line tip is poorly visualized due to body habitus. It is followed into the left brachiocephalic region but is difficult to follow any further. Nasogastric tube is followed into the distal esophagus but again, is difficult to follow further due to body habitus. Defibrillator pad is seen over the left chest. Heart is enlarged. Lungs are somewhat low in volume with mild interstitial prominence and indistinctness. No definite pleural fluid. No pneumothorax. IMPRESSION: 1. Body habitus is limiting and degrades image quality. 2. Left IJ central line tip is followed into the left brachiocephalic vein but is difficult to see beyond that due to body habitus. 3. Suspect mild pulmonary edema. Electronically Signed   By: Lorin Picket M.D.   On:  08/31/2016 11:31   Dg Chest Port 1 View  Result Date: 09/22/2016 CLINICAL DATA:  Hypoxia.  Status post cardiac arrest EXAM: PORTABLE CHEST 1 VIEW COMPARISON:  None. FINDINGS: Endotracheal tube tip is 3.5 cm above the carina. Nasogastric to tip and side port are below the diaphragm. No pneumothorax. There is cardiomegaly with pulmonary venous hypertension. There is slight interstitial edema. There is no appreciable airspace consolidation. No adenopathy evident. No bone lesions. IMPRESSION: Tube positions as described without pneumothorax. Evidence a degree of congestive heart failure without consolidation or volume loss. Electronically Signed   By: Lowella Grip III M.D.   On: 09/15/2016 08:33     Assessment and Recommendation  51 y.o. male with known diabetes with complication essential hypertension mixed hyperlipidemia and chronic nonvalvular atrial fibrillation with obesity and severe sleep apnea with the cardiac arrest and no current evidence of the acute coronary syndrome due to no significant EKG changes or elevation of troponin. Echocardiogram suggested patient has had sleep apnea atrial fibrillation as a primary cause of cardiomyopathy likely causing her rhythm disturbance and heart failure with acute arrest. Now with ventricular tachycardia by monitor the patient may have had full arrest due to V. tach and sudden death 1. Continue supportive care with oxygenation and pressure support and follow for any neurologic improvements 2. Heart rate control with amiodarone if able but no further cardiac intervention at this time 3. Furosemide for pulmonary edema as needed 4. Consider palliative care with poor prognosis after evaluation from neurology 5. Call if further questions Signed, Serafina Royals M.D. FACC

## 2016-09-10 NOTE — Progress Notes (Signed)
RN made Dr. Nicholos Johnsamachandran aware that patient is having increased work of breathing not improved with fentanyl bolus.  MD gave order to start propofol and titrate versed off and to decrease tube feeds to 30 ml/h.

## 2016-09-10 NOTE — Progress Notes (Signed)
Patient ID: Isaiah [name], male   DOB: 1965/12/23, 51 y.o.   MRN: [id number]  [facility] PROGRESS NOTE  Isaiah [name] NGE:[id number] DOB: June 15, 1965 DOA: 09/24/2016 PCP: Patient, No Pcp Per  HPI/Subjective: Patient unresponsive to sternal rub. Patient placed on 100% FiO2 and more with difficulty breathing. Also with rapid atrial fibrillation  Objective: Vitals:   09/10/16 1130 09/10/16 1200  BP: (!) 86/65 106/64  Pulse: (!) 135 (!) 135  Resp: (!) 25 (!) 29  Temp:  98.8 F (37.1 C)    Filed Weights   09/08/16 1406 09/09/16 0416 09/10/16 0400  Weight: (!) 207.7 kg (458 lb) (!) 191.4 kg (422 lb) (!) 216.8 kg (478 lb)    ROS: Review of Systems  Unable to perform ROS: Critical illness   Exam: Physical Exam  HENT:  Nose: No mucosal edema.  Unable to look into mouth at this time  Eyes: Conjunctivae and lids are normal.  Pupils pinpoint  Neck: Carotid bruit is not present. No thyroid mass and no thyromegaly present.  Cardiovascular: S1 normal and S2 normal.  An irregularly irregular rhythm present. Tachycardia present.  Exam reveals distant heart sounds. Exam reveals no gallop.   No murmur heard. Pulses:      Dorsalis pedis pulses are 2+ on the right side, and 2+ on the left side.  Respiratory: No respiratory distress. He has decreased breath sounds in the right middle field, the right lower field, the left middle field and the left lower field. He has no wheezes. He has no rhonchi. He has no rales.  GI: Soft. Bowel sounds are normal. There is no tenderness.  Musculoskeletal:       Right knee: He exhibits swelling.       Left knee: He exhibits swelling.       Right ankle: He exhibits swelling.       Left ankle: He exhibits swelling.  Lymphadenopathy:    He has no cervical adenopathy.  Neurological: He is unresponsive.  Unresponsive to painful stimuli  Skin: No rash noted. Nails show no clubbing.  Psychiatric:  Unable to assess at this time secondary to unresponsive       Data Reviewed: Basic Metabolic Panel:  Recent Labs Lab 09/09/16 1629 09/09/16 2048 09/10/16 0030 09/10/16 0038 09/10/16 0424 09/10/16 0922  NA 138 138  --  137 137 PENDING  K 4.6 4.0  --  3.9 3.9 PENDING  CL 98* 101  --  101 101 PENDING  CO2 27 25  --  27 28 PENDING  GLUCOSE 149* 145*  --  137* 132* 160*  BUN 53* 58*  --  50* 48* 49*  CREATININE 4.16* 3.85*  --  3.79* 3.45* 3.47*  CALCIUM 7.9* 7.8*  --  7.7* 7.7* PENDING  MG 2.4 2.4 2.2  --  2.3 2.0  PHOS 8.2* 7.4*  --  6.5* 5.8* 5.8*   Liver Function Tests:  Recent Labs Lab 09/06/2016 0750 09/09/16 1629 09/09/16 2048 09/10/16 0038 09/10/16 0424 09/10/16 0922  AST 102*  --   --   --   --   --   ALT 60  --   --   --   --   --   ALKPHOS 93  --   --   --   --   --   BILITOT 1.4*  --   --   --   --   --   PROT 7.0  --   --   --   --   --  ALBUMIN 3.1* 3.3* 3.2* 3.0* 3.0* 3.0*   CBC:  Recent Labs Lab 09/17/2016 0750 09/08/16 0152 09/09/16 0431  WBC 15.4* 8.2 12.1*  NEUTROABS 12.6*  --   --   HGB 13.2 13.3 12.8*  HCT 40.7 41.3 39.8*  MCV 80.8 80.5 80.1  PLT 257 178 185   Cardiac Enzymes:  Recent Labs Lab 09/21/2016 0750 09/17/2016 1208 08/31/2016 1632 09/08/16 0152 09/08/16 0532  TROPONINI 0.08* 0.44* 0.47* 0.21* 0.15*   BNP (last 3 results)  Recent Labs  09/18/2016 0750  BNP 508.0*     CBG:  Recent Labs Lab 09/09/16 1954 09/09/16 2341 09/10/16 0343 09/10/16 0721 09/10/16 1123  GLUCAP 123* 142* 125* 119* 133*    Recent Results (from the past 240 hour(s))  Culture, blood (routine x 2)     Status: None (Preliminary result)   Collection Time: 09/20/2016  8:18 AM  Result Value Ref Range Status   Specimen Description BLOOD L AC  Final   Special Requests   Final    BOTTLES DRAWN AEROBIC AND ANAEROBIC Blood Culture adequate volume   Culture NO GROWTH 3 DAYS  Final   Report Status PENDING  Incomplete  MRSA PCR Screening     Status: None   Collection Time: 09/11/2016  9:46 AM  Result Value  Ref Range Status   MRSA by PCR NEGATIVE NEGATIVE Final    Comment:        The GeneXpert MRSA Assay (FDA approved for NASAL specimens only), is one component of a comprehensive MRSA colonization surveillance program. It is not intended to diagnose MRSA infection nor to guide or monitor treatment for MRSA infections.   Culture, blood (routine x 2)     Status: None (Preliminary result)   Collection Time: 09/12/2016 11:45 AM  Result Value Ref Range Status   Specimen Description BLOOD L HAND  Final   Special Requests   Final    BOTTLES DRAWN AEROBIC ONLY Blood Culture results may not be optimal due to an inadequate volume of blood received in culture bottles   Culture NO GROWTH 3 DAYS  Final   Report Status PENDING  Incomplete  Culture, respiratory (NON-Expectorated)     Status: None   Collection Time: 08/29/2016  6:32 PM  Result Value Ref Range Status   Specimen Description TRACHEAL ASPIRATE  Final   Special Requests NONE  Final   Gram Stain   Final    FEW WBC PRESENT, PREDOMINANTLY PMN MODERATE GRAM POSITIVE COCCI IN CHAINS IN PAIRS MODERATE GRAM NEGATIVE RODS    Culture   Final    Consistent with normal respiratory flora. Performed at [facility], [place] [address], [place], [place] [address]    Report Status 09/10/2016 FINAL  Final     Studies: Dg Chest 1 View  Result Date: 09/09/2016 CLINICAL DATA:  Dyspnea EXAM: CHEST 1 VIEW COMPARISON:  09/08/2016 FINDINGS: Endotracheal tube and nasogastric catheter are noted in satisfactory position. Left jugular central line is noted although the tip does not appear to extend into the innominate vein. A right jugular central line is noted extending into the right atrium. Mild vascular congestion is seen with mild interstitial edema. No focal infiltrate is seen no pneumothorax is noted. IMPRESSION: Stable changes in the lungs. New right jugular central line is seen without pneumothorax. Electronically Signed   By: Inez Catalina M.D.   On:  09/09/2016 12:09   Ct Head Wo Contrast  Result Date: 09/09/2016 CLINICAL DATA:  Anoxic encephalopathy. EXAM:  CT HEAD WITHOUT CONTRAST TECHNIQUE: Contiguous axial images were obtained from the base of the skull through the vertex without intravenous contrast. COMPARISON:  CT scan of September 07, 2016. FINDINGS: Brain: No midline shift is noted. There is loss of gray-white matter differentiation as well as sulcal effacement involving the cerebral cortices bilaterally consistent with diffuse cerebral edema, most likely due to anoxic injury. No hemorrhage is noted. Vascular: No hyperdense vessel or unexpected calcification. Skull: Normal. Negative for fracture or focal lesion. Sinuses/Orbits: Multiple small bilateral maxillary mucous retention cysts are noted. Other: None. IMPRESSION: There has been interval loss of gray-white matter differentiation as well as sulcal effacement involving the cerebral cortices bilaterally consistent with diffuse cerebral edema, most likely due to anoxic injury. Critical Value/emergent results were called by telephone at the time of interpretation on 09/09/2016 at 1:45 pm to [place], the patient's nurse, who verbally acknowledged these results and will contact the ordering physician. Electronically Signed   By: Marijo Conception, M.D.   On: 09/09/2016 13:46    Scheduled Meds: . bisacodyl  10 mg Rectal Once  . budesonide (PULMICORT) nebulizer solution  0.5 mg Nebulization BID  . chlorhexidine gluconate (MEDLINE KIT)  15 mL Mouth Rinse BID  . diltiazem  60 mg Oral Q8H  . feeding supplement (VITAL HIGH PROTEIN)  1,000 mL Per Tube Q24H  . furosemide  60 mg Intravenous Q8H  . insulin aspart  2-6 Units Subcutaneous Q4H  . insulin glargine  10 Units Subcutaneous Q24H  . ipratropium-albuterol  3 mL Nebulization Q6H  . mouth rinse  15 mL Mouth Rinse 10 times per day  . pantoprazole sodium  40 mg Per Tube Daily  . sodium chloride flush  10-40 mL Intracatheter Q12H   Continuous  Infusions: . sodium chloride 10 mL/hr at 09/09/16 2000  . amiodarone 30 mg/hr (09/10/16 0745)  . epinephrine    . fentaNYL infusion INTRAVENOUS 150 mcg/hr (09/09/16 2148)  . heparin 1,500 Units/hr (09/10/16 1043)  . midazolam (VERSED) infusion 4 mg/hr (09/10/16 1109)  . norepinephrine (LEVOPHED) Adult infusion Stopped (09/09/16 1803)  . pureflow 3 each (09/10/16 0819)  . valproate sodium Stopped (09/10/16 0630)    Assessment/Plan:  1. Cardiac arrest. My suspicion this is arrhythmia related with an EF of 10% seen on echocardiogram.  Supportive care.  Overall prognosis poor with anoxic encephalopathy. 2. 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 On Lasix 60 mg IV every 8 hours. Other cardiac meds on hold at this point secondary to Hypotension.  With EF of 10% his overall prognosis is poor.  3. Acute kidney injury secondary to cardiac arrest, CT scan with contrast. Patient on continuous dialysis. 4. Anoxic encephalopathy with brain edema. Acute encephalopathy. Patient with right facial twitching yesterday. Patient currently on sedation with fentanyl and versed. Previous EEG showing no seizures. Patient started on valproate sodium  Neurology follow up.  Agree with palliative care consultation 5. Hypotension- off Levophed at this point 6. Type 2 diabetes mellitus. On sliding scale and low-dose Levemir. 7. Atrial fibrillation. Patient on heparin drip at this point. Patient on amiodarone drip and Cardizem 8. Morbid obesity and likely sleep apnea. Weight loss needed.  Code Status:     Code Status Orders        Start     Ordered   09/22/2016 1005  Full code  Continuous     09/20/2016 1007    Code Status History    Date Active Date  Inactive Code Status Order ID Comments User Context   09/18/2016  8:30 AM 09/13/2016 10:08 AM Full Code [id number]  Loletha Grayer, MD ED     Family Communication: As per critical care specialist Disposition Plan: To  be determined based on clinical course. Overall prognosis is poor. High likelihood another cardiac arrest  Consultants:  Critical care specialist  Cardiology  Neurology  Nephrology  Palliative care consultation  Time spent: 24 minutes  [place], [facility]

## 2016-09-10 NOTE — Progress Notes (Signed)
Patient had 4 beat run of vtach, [name], NP notified. No new orders currently. Isaiah KusterBrandi R Mansfield

## 2016-09-10 NOTE — Progress Notes (Signed)
Via telephone notified NP of elevated HR. Patient sustaining in 130's-140's in Afib. Blood pressure stable 111/62 (78) Not requiring pressors. Received new orders for Amio Bolus. See EMAR. Will continue to monitor and assess patient.

## 2016-09-10 NOTE — Progress Notes (Signed)
[facility] Consultation    SYNOPSIS   51 yo male with DM, afib,  cardiac arrest at home with Vfib arrest. Resuscitated, s/p cooling, now in temp maintenance.   ASSESSMENT/PLAN     PULMONARY A:Acute hypoxic respiratory failure with cardiac arrest. Blood gas 7.24/65/64/27.9 Consistent with  respiratory acidosis. P:   Continue vent support.  VENTILATOR SETTINGS: Vent Mode: PRVC FiO2 (%):  [100 %] 100 % Set Rate:  [20 bmp] 20 bmp Vt Set:  [500 mL] 500 mL PEEP:  [10 cmH20-12 cmH20] 12 cmH20 Plateau Pressure:  [27 cmH20] 27 cmH20  CARDIOVASCULAR A: Cardiac and vfib arrest.  no evidence of acute MI, cardiology following Cardiomyopathy; Echo 09/10/16 EF=10% P:  Continue  supportive measures.  Continue IV heparin, given history of atrial fibrillation.   HEMODYNAMICS: CVP:  [12 mmHg-66 mmHg] 12 mmHg  RENAL A:  Oliguric acute renal failure. P:   CRRT initiated 6/13; continue per nephrology.   INTAKE / OUTPUT:  Intake/Output Summary (Last 24 hours) at 09/10/16 0932 Last data filed at 09/10/16 0900  Gross per 24 hour  Intake          3118.71 ml  Output             1210 ml  Net          1908.71 ml    GASTROINTESTINAL A:  -- HEMATOLOGIC A:  --  INFECTIOUS A:  -- P:    Micro/culture results:  BCx2 Negative to date UC -- Sputum pending  Antibiotics:   ENDOCRINE A:  DM  P:   SSI  NEUROLOGIC A:  Acute anoxic encephalopathy; facial myoclonus; CT head images personally reviewed 6/13; diffuse cerebral edema. Initial CT head 6/11 CT head negative.   P:   --Discussed with neurology,  Repeat EEG pending, prognosis appears poor.  --Patient has minimal gag, no cough.  MAJOR EVENTS/TEST RESULTS: 06/11:Cardiac arrest, admitted, intubated, CVL placed 06/12: Minimal urine output, CVL retracted. 06/13; rewarming, possible seizures noted on decreasing sedation; CRRT initiated, CT head diffuse cerebral edema.   Best Practices  DVT  Prophylaxis: IV heparin GI Prophylaxis: PPI  ---------------------------------------  ---------------------------------------   Name: Lillia MountainJeffrey M Lattanzio MRN: [id number] DOB: 1966/02/07    ADMISSION DATE:  09/18/2016 Subjective.  The pati  REVIEW OF SYSTEMS:   History could not be obtained due to unresponsiveness.   VITAL SIGNS: Temp:  [97 F (36.1 C)-99.7 F (37.6 C)] 98.8 F (37.1 C) (06/14 0800) Pulse Rate:  [35-131] 131 (06/14 0700) Resp:  [18-36] 21 (06/14 0930) BP: (82-139)/(60-112) 82/65 (06/14 0930) SpO2:  [78 %-99 %] 94 % (06/14 0930) FiO2 (%):  [100 %] 100 % (06/14 0722) Weight:  [216.8 kg (478 lb)] 216.8 kg (478 lb) (06/14 0400) HEMODYNAMICS: CVP:  [12 mmHg-66 mmHg] 12 mmHg VENTILATOR SETTINGS: Vent Mode: PRVC FiO2 (%):  [100 %] 100 % Set Rate:  [20 bmp] 20 bmp Vt Set:  [500 mL] 500 mL PEEP:  [10 cmH20-12 cmH20] 12 cmH20 Plateau Pressure:  [27 cmH20] 27 cmH20 INTAKE / OUTPUT:  Intake/Output Summary (Last 24 hours) at 09/10/16 0932 Last data filed at 09/10/16 0900  Gross per 24 hour  Intake          3118.71 ml  Output             1210 ml  Net          1908.71 ml    Physical Examination:   VS: BP (!) 82/65   Pulse Marland Kitchen(!)  131   Temp 98.8 F (37.1 C)   Resp (!) 21   Ht 6' (1.829 m)   Wt (!) 216.8 kg (478 lb)   SpO2 94%   BMI 64.83 kg/m   General Appearance: No distress  Neuro:without focal findings,sedations. Negative oculocephalic reflex, negative gag, negative cough with deep suctioning. HEENT: PERRLA, EOM intact, no ptosis, no other lesions noticed;  Pulmonary: decreased air entry bilaterally.  CardiovascularNormal S1,S2.  No m/r/g.    Abdomen: Benign, Soft, non-tender, No masses, hepatosplenomegaly, No lymphadenopathy Renal:  No costovertebral tenderness  GU:  Not performed at this time. Endoc: No evident thyromegaly, no signs of acromegaly. Skin:   warm, no rashes, no ecchymosis  Extremities: normal, no cyanosis, clubbing, no edema, warm with  normal capillary refill.    LABS: Reviewed   LABORATORY PANEL:   CBC  Recent Labs Lab 09/09/16 0431  WBC 12.1*  HGB 12.8*  HCT 39.8*  PLT 185    Chemistries   Recent Labs Lab 09/13/2016 0750  09/10/16 0424  NA 137  < > 137  K 3.7  < > 3.9  CL 100*  < > 101  CO2 28  < > 28  GLUCOSE 276*  < > 132*  BUN 23*  < > 48*  CREATININE 1.68*  < > 3.45*  CALCIUM 9.2  < > 7.7*  MG  --   < > 2.3  PHOS  --   < > 5.8*  AST 102*  --   --   ALT 60  --   --   ALKPHOS 93  --   --   BILITOT 1.4*  --   --   < > = values in this interval not displayed.   Recent Labs Lab 09/09/16 1145 09/09/16 1650 09/09/16 1954 09/09/16 2341 09/10/16 0343 09/10/16 0721  GLUCAP 119* 154* 123* 142* 125* 119*    Recent Labs Lab Sep 13, 2016 1030 09/08/16 0500 09/09/16 0500  PHART 7.31* 7.29* 7.24*  PCO2ART 58* 55* 65*  PO2ART 95 168* 64*    Recent Labs Lab Sep 13, 2016 0750  09/09/16 2048 09/10/16 0038 09/10/16 0424  AST 102*  --   --   --   --   ALT 60  --   --   --   --   ALKPHOS 93  --   --   --   --   BILITOT 1.4*  --   --   --   --   ALBUMIN 3.1*  < > 3.2* 3.0* 3.0*  < > = values in this interval not displayed.  Cardiac Enzymes  Recent Labs Lab 09/08/16 0532  TROPONINI 0.15*    RADIOLOGY:       --Wells Guiles, MD.  Board Certified in Internal Medicine, Pulmonary Medicine, Critical Care Medicine, and Sleep Medicine.  ICU Pager [phone] [place] [facility] Office Number: [phone]  Santiago Glad, M.D.  Billy Fischer, M.D   09/10/2016, 9:32 AM  Critical Care Attestation.  I have personally obtained a history, examined the patient, evaluated laboratory and imaging results, formulated the assessment and plan and placed orders. The Patient requires high complexity decision making for assessment and support, frequent evaluation and titration of therapies, application of advanced monitoring technologies and extensive interpretation of multiple  databases. The patient has critical illness that could lead imminently to failure of 1 or more organ systems and requires the highest level of physician preparedness to intervene.  Critical Care Time devoted to patient care services described  in this note is 35 minutes and is exclusive of time spent in procedures supervisory time of NP.

## 2016-09-10 NOTE — Progress Notes (Signed)
ANTICOAGULATION CONSULT NOTE   Pharmacy Consult for heparin drip management  Indication: elevated tropinins   Pharmacy consulted for heparin drip management for 51 yo male admitted s/p vfib arrest. Patient had CPR x 3 rounds. Patient intubated and sedated; s/p targeted temperature management and now being initiated on CRRT. Patient takes apixaban as an outpatient currently has elevated anti-Xa level. Patient currently receiving heparin at 1500 units/hr.   Goal:  APTT: 68-109 anti-Xa: 0.3-0.7 Monitor platelets per protocol   Plan:  APTT = 63 Will increase rate to 1600 units/hr. Will obtain next aPTT in 6 hours. Will manage patient with aPTTs until anti-Xa and aPTT levels correlate. Will obtain next anti-Xa with morning aPTT on 6/15.    APTT (SEC) BOLUS DOSE STOP INFUSION (MINUTES) RATE CHANGE (mL/HR) REPEAT APTT  < 41 3000 units 0 + 1 ml/HR 6 HR  41 - 67 0 0 + 1 ml/HR 6 HR  68 - 109 0 0 NO CHANGE NEXT AM  110 - 136 0 0 - 1 ml/HR 6 HR  137 - 160 0 30 MIN - 1 ml/HR 6 HR  >160 0 60 MIN - 2 ml/HR 6 HR     No Known Allergies  Patient Measurements: Height: 6' (182.9 cm) Weight: (!) 478 lb (216.8 kg) IBW/kg (Calculated) : 77.6 Heparin Dosing Weight: 129kg  Vital Signs: Temp: 98.6 F (37 C) (06/14 1700) BP: 83/58 (06/14 1700) Pulse Rate: 54 (06/14 1700)  Labs:  Recent Labs  09/16/2016 1830  09/08/16 0152  09/08/16 0532  09/09/16 0431  09/10/16 0158  09/10/16 0922 09/10/16 1258 09/10/16 1634  HGB  --   --  13.3  --   --   --  12.8*  --   --   --   --   --   --   HCT  --   --  41.3  --   --   --  39.8*  --   --   --   --   --   --   PLT  --   --  178  --   --   --  185  --   --   --   --   --   --   APTT 85*  --   --   < >  --   < >  --   < > 66*  --  59*  --  63*  LABPROT 17.2*  --   --   --   --   --   --   --   --   --   --   --   --   INR 1.39  --   --   --   --   --   --   --   --   --   --   --   --   HEPARINUNFRC  --   --  2.72*  --   --   --  1.67*  --  0.85*   --   --   --   --   CREATININE  --   < > 2.46*  --  2.67*  < > 3.47*  < >  --   < > 3.47* 3.45* 3.37*  TROPONINI  --   --  0.21*  --  0.15*  --   --   --   --   --   --   --   --   < > =  values in this interval not displayed.  Estimated Creatinine Clearance: 50.5 mL/min (A) (by C-G formula based on SCr of 3.37 mg/dL (H)).   Pharmacy will continue to monitor and adjust per consult.   Horris Latino, PharmD Pharmacy Resident 09/10/2016 5:40 PM

## 2016-09-10 NOTE — Progress Notes (Signed)
[facility]  ROUNDING NOTE   Subjective:   Started on CRRT yesterday UOP 1075 - on furosemide  Off norepinephrine and vasopressin.   Family at bedside.   Objective:  Vital signs in last 24 hours:  Temp:  [97 F (36.1 C)-99.7 F (37.6 C)] 98.8 F (37.1 C) (06/14 0800) Pulse Rate:  [35-131] 131 (06/14 0700) Resp:  [18-36] 20 (06/14 0900) BP: (85-139)/(60-112) 95/62 (06/14 0900) SpO2:  [78 %-99 %] 94 % (06/14 0900) FiO2 (%):  [100 %] 100 % (06/14 0722) Weight:  [216.8 kg (478 lb)] 216.8 kg (478 lb) (06/14 0400)  Weight change: 9.072 kg (20 lb) Filed Weights   09/08/16 1406 09/09/16 0416 09/10/16 0400  Weight: (!) 207.7 kg (458 lb) (!) 191.4 kg (422 lb) (!) 216.8 kg (478 lb)    Intake/Output: I/O last 3 completed shifts: In: 4585.5 [I.V.:2238; NG/GT:2214; IV Piggyback:133.5] Out: 1160 [Urine:1160]   Intake/Output this shift:  Total I/O In: 159.7 [I.V.:59.7; NG/GT:100] Out: 150 [Urine:150]  Physical Exam: General: Critically ill, on arctic sun  Head: ETT  Eyes: Eyes closed  Neck: Supple, trachea midline  Lungs:  Clear to auscultation  Heart: irregular  Abdomen:  Soft, nontender, obese  Extremities: No peripheral edema.  Neurologic: Intubated, sedated  Skin: No lesions  Access: RIJ temp HD catheter 6/13 Dr. Ashby Dawes    Basic Metabolic Panel:  Recent Labs Lab 09/08/16 0152  09/09/16 0431 09/09/16 1629 09/09/16 2048 09/10/16 0030 09/10/16 0038 09/10/16 0424  NA 140  < > 139 138 138  --  137 137  K 4.1  < > 4.2 4.6 4.0  --  3.9 3.9  CL 103  < > 102 98* 101  --  101 101  CO2 29  < > 27 27 25   --  27 28  GLUCOSE 134*  < > 133* 149* 145*  --  137* 132*  BUN 36*  < > 46* 53* 58*  --  50* 48*  CREATININE 2.46*  < > 3.47* 4.16* 3.85*  --  3.79* 3.45*  CALCIUM 8.4*  < > 7.9* 7.9* 7.8*  --  7.7* 7.7*  MG 2.4  --  2.3 2.4 2.4 2.2  --  2.3  PHOS 4.2  --   --  8.2* 7.4*  --  6.5* 5.8*  < > = values in this interval not displayed.  Liver  Function Tests:  Recent Labs Lab 09/10/2016 0750 09/09/16 1629 09/09/16 2048 09/10/16 0038 09/10/16 0424  AST 102*  --   --   --   --   ALT 60  --   --   --   --   ALKPHOS 93  --   --   --   --   BILITOT 1.4*  --   --   --   --   PROT 7.0  --   --   --   --   ALBUMIN 3.1* 3.3* 3.2* 3.0* 3.0*   No results for input(s): LIPASE, AMYLASE in the last 168 hours. No results for input(s): AMMONIA in the last 168 hours.  CBC:  Recent Labs Lab 09/10/2016 0750 09/08/16 0152 09/09/16 0431  WBC 15.4* 8.2 12.1*  NEUTROABS 12.6*  --   --   HGB 13.2 13.3 12.8*  HCT 40.7 41.3 39.8*  MCV 80.8 80.5 80.1  PLT 257 178 185    Cardiac Enzymes:  Recent Labs Lab 09/26/2016 0750 08/28/2016 1208 09/14/2016 1632 09/08/16 0152 09/08/16 0532  TROPONINI 0.08* 0.44* 0.47*  0.21* 0.15*    BNP: Invalid input(s): POCBNP  CBG:  Recent Labs Lab 09/09/16 1650 09/09/16 1954 09/09/16 2341 09/10/16 0343 09/10/16 0721  GLUCAP 154* 123* 142* 125* 30*    Microbiology: Results for orders placed or performed during the hospital encounter of 09/05/2016  Culture, blood (routine x 2)     Status: None (Preliminary result)   Collection Time: 09/20/2016  8:18 AM  Result Value Ref Range Status   Specimen Description BLOOD L AC  Final   Special Requests   Final    BOTTLES DRAWN AEROBIC AND ANAEROBIC Blood Culture adequate volume   Culture NO GROWTH 3 DAYS  Final   Report Status PENDING  Incomplete  MRSA PCR Screening     Status: None   Collection Time: 09/01/2016  9:46 AM  Result Value Ref Range Status   MRSA by PCR NEGATIVE NEGATIVE Final    Comment:        The GeneXpert MRSA Assay (FDA approved for NASAL specimens only), is one component of a comprehensive MRSA colonization surveillance program. It is not intended to diagnose MRSA infection nor to guide or monitor treatment for MRSA infections.   Culture, blood (routine x 2)     Status: None (Preliminary result)   Collection Time: 09/04/2016 11:45  AM  Result Value Ref Range Status   Specimen Description BLOOD L HAND  Final   Special Requests   Final    BOTTLES DRAWN AEROBIC ONLY Blood Culture results may not be optimal due to an inadequate volume of blood received in culture bottles   Culture NO GROWTH 3 DAYS  Final   Report Status PENDING  Incomplete  Culture, respiratory (NON-Expectorated)     Status: None   Collection Time: 09/04/2016  6:32 PM  Result Value Ref Range Status   Specimen Description TRACHEAL ASPIRATE  Final   Special Requests NONE  Final   Gram Stain   Final    FEW WBC PRESENT, PREDOMINANTLY PMN MODERATE GRAM POSITIVE COCCI IN CHAINS IN PAIRS MODERATE GRAM NEGATIVE RODS    Culture   Final    Consistent with normal respiratory flora. Performed at [facility], [place] [address] [place], [place] [address]    Report Status 09/10/2016 FINAL  Final    Coagulation Studies:  Recent Labs  09/20/2016 1208 08/30/2016 1830  LABPROT 16.9* 17.2*  INR 1.36 1.39    Urinalysis: No results for input(s): COLORURINE, LABSPEC, PHURINE, GLUCOSEU, HGBUR, BILIRUBINUR, KETONESUR, PROTEINUR, UROBILINOGEN, NITRITE, LEUKOCYTESUR in the last 72 hours.  Invalid input(s): APPERANCEUR    Imaging: Dg Chest 1 View  Result Date: 09/09/2016 CLINICAL DATA:  Dyspnea EXAM: CHEST 1 VIEW COMPARISON:  09/08/2016 FINDINGS: Endotracheal tube and nasogastric catheter are noted in satisfactory position. Left jugular central line is noted although the tip does not appear to extend into the innominate vein. A right jugular central line is noted extending into the right atrium. Mild vascular congestion is seen with mild interstitial edema. No focal infiltrate is seen no pneumothorax is noted. IMPRESSION: Stable changes in the lungs. New right jugular central line is seen without pneumothorax. Electronically Signed   By: Inez Catalina M.D.   On: 09/09/2016 12:09   Ct Head Wo Contrast  Result Date: 09/09/2016 CLINICAL DATA:  Anoxic encephalopathy.  EXAM: CT HEAD WITHOUT CONTRAST TECHNIQUE: Contiguous axial images were obtained from the base of the skull through the vertex without intravenous contrast. COMPARISON:  CT scan of September 07, 2016. FINDINGS: Brain: No midline  shift is noted. There is loss of gray-white matter differentiation as well as sulcal effacement involving the cerebral cortices bilaterally consistent with diffuse cerebral edema, most likely due to anoxic injury. No hemorrhage is noted. Vascular: No hyperdense vessel or unexpected calcification. Skull: Normal. Negative for fracture or focal lesion. Sinuses/Orbits: Multiple small bilateral maxillary mucous retention cysts are noted. Other: None. IMPRESSION: There has been interval loss of gray-white matter differentiation as well as sulcal effacement involving the cerebral cortices bilaterally consistent with diffuse cerebral edema, most likely due to anoxic injury. Critical Value/emergent results were called by telephone at the time of interpretation on 09/09/2016 at 1:45 pm to [place], the patient's nurse, who verbally acknowledged these results and will contact the ordering physician. Electronically Signed   By: Marijo Conception, M.D.   On: 09/09/2016 13:46   Dg Chest Port 1 View  Result Date: 09/08/2016 CLINICAL DATA:  Central line placement EXAM: PORTABLE CHEST 1 VIEW COMPARISON:  09/08/2016 FINDINGS: Endotracheal tube with the tip 5.6 cm above the carina. Nasogastric tube coursing below the diaphragm. Left central venous catheter with the tip projecting over the proximal jugular vein, but is not well delineated secondary to technique. Mild bilateral diffuse interstitial thickening. No pleural effusion or pneumothorax. Stable cardiomegaly. IMPRESSION: 1. Endotracheal tube with the tip 5.6 cm above the carina. 2. Left central venous catheter with the tip projecting over the proximal jugular vein, but is not well delineated secondary to technique. 3. Cardiomegaly with mild pulmonary  vascular congestion. Electronically Signed   By: Kathreen Devoid   On: 09/08/2016 09:50     Medications:   . sodium chloride 10 mL/hr at 09/09/16 2000  . amiodarone 30 mg/hr (09/10/16 0745)  . epinephrine    . fentaNYL infusion INTRAVENOUS 150 mcg/hr (09/09/16 2148)  . heparin 1,400 Units/hr (09/10/16 0248)  . midazolam (VERSED) infusion 4 mg/hr (09/09/16 2152)  . norepinephrine (LEVOPHED) Adult infusion Stopped (09/09/16 1803)  . pureflow 3 each (09/10/16 0819)  . valproate sodium Stopped (09/10/16 0630)   . budesonide (PULMICORT) nebulizer solution  0.5 mg Nebulization BID  . chlorhexidine gluconate (MEDLINE KIT)  15 mL Mouth Rinse BID  . diltiazem  60 mg Oral Q8H  . feeding supplement (VITAL HIGH PROTEIN)  1,000 mL Per Tube Q24H  . furosemide  60 mg Intravenous Q8H  . insulin aspart  2-6 Units Subcutaneous Q4H  . insulin glargine  10 Units Subcutaneous Q24H  . ipratropium-albuterol  3 mL Nebulization Q6H  . mouth rinse  15 mL Mouth Rinse 10 times per day  . pantoprazole sodium  40 mg Per Tube Daily  . sodium chloride flush  10-40 mL Intracatheter Q12H   acetaminophen **OR** acetaminophen, fentaNYL, heparin, midazolam, polyvinyl alcohol, sodium chloride flush  Assessment/ Plan:  Mr. Isaiah [name] is a 51 y.o. black male with diabetes mellitus type II, hyeprtension, congestive heart failure, atrial fibrillation,  who was admitted to [facility] on 09/04/2016 for Cardiac arrest ([place]) [I46.9]  1. Acute renal failure: on chronic kidney disease stage III baseline creatinine of 1.4 04/10/16.  Nonoliguric - responding to furosemide. Continue furosemide - Continue CRRT:  CVVHD 2K bath BFR 350 UF 0 - Monitor renal function, volume status, urine output and electrolytes  2. Cardiogenic shock: off vasopressors - appreciate cards input  3. Respiratory Failure: intubated and sedate.   Overall prognosis is poor. Discussed with patient's parents.    LOS: [place], [place] 6/14/20189:16  AM

## 2016-09-10 NOTE — Progress Notes (Signed)
ANTICOAGULATION CONSULT NOTE   Pharmacy Consult for heparin drip management  Indication: elevated tropinins   Pharmacy consulted for heparin drip management for 51 yo male admitted s/p vfib arrest. Patient had CPR x 3 rounds. Patient intubated and sedated; s/p targeted temperature management and now being initiated on CRRT. Patient takes apixaban as an outpatient currently has elevated anti-Xa level. Patient currently receiving heparin at 1200 units/hr.   Goal:  APTT: 68-109 anti-Xa: 0.3-0.7 Monitor platelets per protocol   Plan:  Will increase rate to 1500 units/hr. Will obtain next aPTT at [address] on 6/14. Will manage patient with aPTTs until anti-Xa and aPTT levels correlate. Will obtain next anti-Xa with morning aPTT on 6/15.    APTT (SEC) BOLUS DOSE STOP INFUSION (MINUTES) RATE CHANGE (mL/HR) REPEAT APTT  < 41 3000 units 0 + 1 ml/HR 6 HR  41 - 67 0 0 + 1 ml/HR 6 HR  68 - 109 0 0 NO CHANGE NEXT AM  110 - 136 0 0 - 1 ml/HR 6 HR  137 - 160 0 30 MIN - 1 ml/HR 6 HR  >160 0 60 MIN - 2 ml/HR 6 HR     No Known Allergies  Patient Measurements: Height: 6' (182.9 cm) Weight: (!) 478 lb (216.8 kg) IBW/kg (Calculated) : 77.6 Heparin Dosing Weight: 129kg  Vital Signs: Temp: 99.5 F (37.5 C) (06/14 1600) BP: 79/56 (06/14 1600) Pulse Rate: 93 (06/14 1600)  Labs:  Recent Labs  10-07-2016 1830  09/08/16 0152  09/08/16 0532  09/09/16 0431  09/09/16 1629  09/10/16 0158 09/10/16 0424 09/10/16 0922 09/10/16 1258  HGB  --   --  13.3  --   --   --  12.8*  --   --   --   --   --   --   --   HCT  --   --  41.3  --   --   --  39.8*  --   --   --   --   --   --   --   PLT  --   --  178  --   --   --  185  --   --   --   --   --   --   --   APTT 85*  --   --   < >  --   < >  --   < > 58*  --  66*  --  59*  --   LABPROT 17.2*  --   --   --   --   --   --   --   --   --   --   --   --   --   INR 1.39  --   --   --   --   --   --   --   --   --   --   --   --   --   HEPARINUNFRC  --   --   2.72*  --   --   --  1.67*  --   --   --  0.85*  --   --   --   CREATININE  --   < > 2.46*  --  2.67*  < > 3.47*  --  4.16*  < >  --  3.45* 3.47* 3.45*  TROPONINI  --   --  0.21*  --  0.15*  --   --   --   --   --   --   --   --   --   < > =  values in this interval not displayed.  Estimated Creatinine Clearance: 49.4 mL/min (A) (by C-G formula based on SCr of 3.45 mg/dL (H)).   Pharmacy will continue to monitor and adjust per consult.   Codie Krogh L, RPH 09/10/2016,4:35 PM

## 2016-09-10 NOTE — Progress Notes (Signed)
Propofol started and versed stopped per Dr. Carolyn Stareamachandran's order.  Work of breathing has improved since propofol started although blood pressure has dropped therefore levophed drip was started.  Afib per cardiac monitor on amiodarone drip.  400 cc UOP thus far this shift.  Continuous dialysis treatment being tolerated.  Mother and father spoke with palliative care today and have been updated on plan of care.  Report given to Brandi, RN who is now taking over patient's care for remainder of shift.

## 2016-09-10 NOTE — Consult Note (Signed)
Consultation Note Date: 09/10/2016   Patient Name: Isaiah [name]  DOB: 11/24/1965  MRN: [id number]  Age / Sex: 51 y.o., male  PCP: Patient, No Pcp Per Referring Physician: Loletha Grayer, MD  Reason for Consultation: Establishing goals of care and Psychosocial/spiritual support  HPI/Patient Profile: 51 y.o. male   with past medical  history of hypertension,  diabetes and congestive heart failure, admitted on  08/28/2016 with cardiac arrest.  CPR was initiated in the home by his brother.  EMS took over and brought patient  to the ER. EMS performed CPR, initially he was asystole then ventricular fibrillation then PEA.   Currently he remains intubated with severe cardiomyopathy with an EF of 10%,  acute  on chronic kidney disease stage III baseline creatinine of 1.4 receiving CVVT at bedside, unresponsive to verbal stimuli and sternal rub, extremities are flaccid.  Neurology recommending continued evaluation specifically until patient has been normothermic for at least 72 hours and further imaging if possible.  Overall long term prognosis is poor. Family face the likelihood that their son will not have a meaningful recovery   Clinical Assessment and Goals of Care:  This NP Wadie Lessen reviewed medical records, received report from team, assessed the patient and then meet at the patient's bedside along with his mother and father  to discuss diagnosis, prognosis, GOC, EOL wishes disposition and options.  Concept of Palliative Care was and role in a holistic patein centered care plan introduced.  Today I meet with the patient's parents to create the space and opportunity for them to share thoughts, feelings and fears about the current situation.  They remain strong in their faith.  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  Their church family is a main support.    We talked today about  the best vs worse case scenarios. Values and goals of care important to patient were attempted to be elicited.  Questions and concerns addressed.   Both parents await input regarding long term prognosis from CCM and neurology in order to "help [place] to know what to pray for",  They request "clear and concise" delivery of information.  Family encouraged to call with questions or concerns.  PMT will continue to support holistically, I let family know I am off and will f/u on Monday.  They can contact PMT tomorrow thru the team phone number # [phone]   NEXT OF KIN/there is no documented HPOA or AD,  Parents today are seeking legal advise regarding guardianship      SUMMARY OF RECOMMENDATIONS    Code Status/Advance Care Planning:  Full code  Additional Recommendations (Limitations, Scope, Preferences):  Full Scope Treatment  Psycho-social/Spiritual:   Desire for further Chaplaincy support:yes-strong community church support  Additional Recommendations: emotional support offered  Prognosis:   Poor prognosis  Discharge Planning: To Be Determined      Primary Diagnoses: Present on Admission: . Cardiac arrest ([place])   I have reviewed the medical record, interviewed the patient and family, and examined  the patient. The following aspects are pertinent.  Past Medical History:  Diagnosis Date  . CHF (congestive heart failure) ([place])   . Diabetes mellitus without complication ([place])   . Hypertension    Social History   Social History  . Marital status: Single    Spouse name: N/A  . Number of children: N/A  . Years of education: N/A   Social History Main Topics  . Smoking status: Never Smoker  . Smokeless tobacco: Never Used  . Alcohol use No  . Drug use: No  . Sexual activity: Not Asked   Other Topics Concern  . None   Social History Narrative  . None   Family History  Problem Relation Age of Onset  . Hypertension Mother   . Hypertension Father   . Diabetes  Father    Scheduled Meds: . bisacodyl  10 mg Rectal Once  . budesonide (PULMICORT) nebulizer solution  0.5 mg Nebulization BID  . chlorhexidine gluconate (MEDLINE KIT)  15 mL Mouth Rinse BID  . diltiazem  60 mg Oral Q8H  . feeding supplement (VITAL HIGH PROTEIN)  1,000 mL Per Tube Q24H  . furosemide  60 mg Intravenous Q8H  . insulin aspart  2-6 Units Subcutaneous Q4H  . insulin glargine  10 Units Subcutaneous Q24H  . ipratropium-albuterol  3 mL Nebulization Q6H  . mouth rinse  15 mL Mouth Rinse 10 times per day  . pantoprazole sodium  40 mg Per Tube Daily  . sodium chloride flush  10-40 mL Intracatheter Q12H   Continuous Infusions: . sodium chloride 10 mL/hr at 09/09/16 2000  . amiodarone 30 mg/hr (09/10/16 0745)  . epinephrine    . fentaNYL infusion INTRAVENOUS 150 mcg/hr (09/09/16 2148)  . heparin 1,500 Units/hr (09/10/16 1043)  . midazolam (VERSED) infusion 4 mg/hr (09/09/16 2152)  . norepinephrine (LEVOPHED) Adult infusion Stopped (09/09/16 1803)  . pureflow 3 each (09/10/16 0819)  . valproate sodium Stopped (09/10/16 0630)   PRN Meds:.acetaminophen **OR** acetaminophen, fentaNYL, heparin, midazolam, polyvinyl alcohol, sodium chloride flush Medications Prior to Admission:  Prior to Admission medications   Medication Sig Start Date End Date Taking? Authorizing Provider  apixaban (ELIQUIS) 5 MG TABS tablet Take 5 mg by mouth 2 (two) times daily.   Yes [provider]  atorvastatin (LIPITOR) 20 MG tablet Take 20 mg by mouth daily.   Yes [provider]  benazepril (LOTENSIN) 40 MG tablet Take 40 mg by mouth daily.   Yes [provider]  carvedilol (COREG) 25 MG tablet Take 25 mg by mouth 2 (two) times daily with a meal.   Yes [provider]  diltiazem (DILACOR XR) 240 MG 24 hr capsule Take 240 mg by mouth.   Yes [provider]  furosemide (LASIX) 80 MG tablet Take 80 mg by mouth daily as needed.   Yes [provider]    glimepiride (AMARYL) 2 MG tablet Take 2 mg by mouth daily with breakfast.   Yes [provider]  magnesium oxide (MAG-OX) 400 MG tablet Take 400 mg by mouth.   Yes [provider]  METFORMIN HCL ER PO Take 1,000 mg by mouth 2 (two) times daily.   Yes [provider]  metolazone (ZAROXOLYN) 2.5 MG tablet Take 2.5 mg by mouth every other day.   Yes [provider]   No Known Allergies Review of Systems  Unable to perform ROS: Acuity of condition    Physical Exam  Constitutional:  - morbidly obese, intubated  Cardiovascular: Tachycardia present.   Pulmonary/Chest: Tachypnea noted.  Neurological: He is unresponsive. No cranial nerve deficit or sensory deficit. He exhibits abnormal muscle tone.  Skin: Skin is warm and dry.    Vital Signs: BP (!) 86/62   Pulse (!) 126   Temp 98.6 F (37 C)   Resp (!) 24   Ht 6' (1.829 m)   Wt (!) 216.8 kg (478 lb)   SpO2 94%   BMI 64.83 kg/m  Pain Assessment: CPOT       SpO2: SpO2: 94 % O2 Device:SpO2: 94 % O2 Flow Rate: .   IO: Intake/output summary:  Intake/Output Summary (Last 24 hours) at 09/10/16 1108 Last data filed at 09/10/16 1000  Gross per 24 hour  Intake          3037.01 ml  Output             1210 ml  Net          1827.01 ml    LBM: Last BM Date:  (unknown pta 09/14/2016) Baseline Weight: Weight: (!) 205.6 kg (453 lb 4.3 oz) Most recent weight: Weight: (!) 216.8 kg (478 lb)     Palliative Assessment/Data: 20% at best   Discussed with Dr Doy Mince and Dr Kasa/Dr Felicie Morn and Dr Leslye Peer- recommend coordination of providers and family for a joint meeting to enhance patient centered care and facilitate communication  Time In: 1200 Time Out: 1330 Time Total: 90 min Greater than 50%  of this time was spent counseling and coordinating care related to the above assessment and plan.  Signed by: Wadie Lessen, NP   Please contact Palliative Medicine Team phone at [phone] for questions and  concerns.  For individual provider: See Shea Evans

## 2016-09-10 NOTE — Progress Notes (Signed)
ANTICOAGULATION CONSULT NOTE   Pharmacy Consult for heparin drip management  Indication: elevated tropinins   Pharmacy consulted for heparin drip management for 51 yo male admitted s/p vfib arrest. Patient had CPR x 3 rounds. Patient intubated and sedated; s/p targeted temperature management and now being initiated on CRRT. Patient takes apixaban as an outpatient currently has elevated anti-Xa level. Patient currently receiving heparin at 1200 units/hr.   Goal:  APTT: 68-109 anti-Xa: 0.3-0.7 Monitor platelets per protocol   Plan:  Will increase rate to 1300 units/hr. Will obtain next aPTT at 0200 on 6/14. Will manage patient with aPTTs until anti-Xa and aPTT levels correlate.   6/14 02:00 heparin level 0.85, aPTT 66. Increase to 1400 units/hr and recheck in 6 hours.   APTT (SEC) BOLUS DOSE STOP INFUSION (MINUTES) RATE CHANGE (mL/HR) REPEAT APTT  < 41 3000 units 0 + 1 ml/HR 6 HR  41 - 67 0 0 + 1 ml/HR 6 HR  68 - 109 0 0 NO CHANGE NEXT AM  110 - 136 0 0 - 1 ml/HR 6 HR  137 - 160 0 30 MIN - 1 ml/HR 6 HR  >160 0 60 MIN - 2 ml/HR 6 HR     No Known Allergies  Patient Measurements: Height: 6' (182.9 cm) Weight: (!) 422 lb (191.4 kg) IBW/kg (Calculated) : 77.6 Heparin Dosing Weight: 129kg  Vital Signs: Temp: 97.5 F (36.4 C) (06/13 2349) Temp Source: Core (Comment) (06/13 2349) BP: 118/78 (06/14 0200) Pulse Rate: 85 (06/14 0200)  Labs:  Recent Labs  September 15, 2016 0750  September 15, 2016 1208  September 15, 2016 1632 September 15, 2016 1830  09/08/16 0152  09/08/16 0532  09/09/16 0431 09/09/16 0605 09/09/16 1629 09/09/16 2048 09/10/16 0038 09/10/16 0158  HGB 13.2  --   --   --   --   --   --  13.3  --   --   --  12.8*  --   --   --   --   --   HCT 40.7  --   --   --   --   --   --  41.3  --   --   --  39.8*  --   --   --   --   --   PLT 257  --   --   --   --   --   --  178  --   --   --  185  --   --   --   --   --   APTT  --   < > 29  --   --  85*  --   --   < >  --   < >  --  113* 58*  --   --   66*  LABPROT 16.6*  --  16.9*  --   --  17.2*  --   --   --   --   --   --   --   --   --   --   --   INR 1.33  --  1.36  --   --  1.39  --   --   --   --   --   --   --   --   --   --   --   HEPARINUNFRC  --   < > 2.42*  --   --   --   --  2.72*  --   --   --  1.67*  --   --   --   --  0.85*  CREATININE 1.68*  --  1.96*  < > 2.12*  --   < > 2.46*  --  2.67*  < > 3.47*  --  4.16* 3.85* 3.79*  --   TROPONINI 0.08*  --  0.44*  --  0.47*  --   --  0.21*  --  0.15*  --   --   --   --   --   --   --   < > = values in this interval not displayed.  Estimated Creatinine Clearance: 41.5 mL/min (A) (by C-G formula based on SCr of 3.79 mg/dL (H)).   Pharmacy will continue to monitor and adjust per consult.   Krishang Reading S, RPH 09/10/2016,2:36 AM

## 2016-09-10 NOTE — Progress Notes (Signed)
Subjective: Patient remains intubated and unresponsive.  Normothermic since 0500 6/13.  Last Vecuronium injection this morning.    Objective: Current vital signs: BP 106/64   Pulse (!) 135   Temp 98.8 F (37.1 C)   Resp (!) 29   Ht 6' (1.829 m)   Wt (!) 216.8 kg (478 lb)   SpO2 93%   BMI 64.83 kg/m  Vital signs in last 24 hours: Temp:  [97 F (36.1 C)-99.5 F (37.5 C)] 98.8 F (37.1 C) (06/14 1200) Pulse Rate:  [35-135] 135 (06/14 1200) Resp:  [18-36] 29 (06/14 1200) BP: (82-139)/(58-112) 106/64 (06/14 1200) SpO2:  [88 %-99 %] 93 % (06/14 1200) FiO2 (%):  [100 %] 100 % (06/14 1121) Weight:  [216.8 kg (478 lb)] 216.8 kg (478 lb) (06/14 0400)  Intake/Output from previous day: 06/13 0701 - 06/14 0700 In: 3105.7 [I.V.:1407.7; ZS/WF:0932.3; IV Piggyback:133.5] Out: 1075 [Urine:1075] Intake/Output this shift: Total I/O In: 745.5 [I.V.:305.5; NG/GT:440] Out: 300 [Urine:300] Nutritional status: Diet NPO time specified  Neurologic Exam: Mental Status: Patient does not respond to verbal stimuli.  Does not respond to deep sternal rub.  Does not follow commands.  No verbalizations noted.  Cranial Nerves: II: patient does not respond confrontation bilaterally, pupils right 2 mm, left 2 mm,and unreactive bilaterally III,IV,VI: doll's response absent bilaterally.  V,VII: corneal reflex absent bilaterally  VIII: patient does not respond to verbal stimuli IX,X: gag reflex reduced, XI: trapezius strength unable to test bilaterally XII: tongue strength unable to test Motor: Extremities flaccid throughout.  No spontaneous movement noted.  No purposeful movements noted. Sensory: Does not respond to noxious stimuli in any extremity. Deep Tendon Reflexes:  1+ throughout Plantars: Mute  bilaterally Cerebellar: Unable to perform    Lab Results: Basic Metabolic Panel:  Recent Labs Lab 09/09/16 1629 09/09/16 2048 09/10/16 0030 09/10/16 0038 09/10/16 0424 09/10/16 0922  NA  138 138  --  137 137 PENDING  K 4.6 4.0  --  3.9 3.9 PENDING  CL 98* 101  --  101 101 PENDING  CO2 27 25  --  27 28 PENDING  GLUCOSE 149* 145*  --  137* 132* 160*  BUN 53* 58*  --  50* 48* 49*  CREATININE 4.16* 3.85*  --  3.79* 3.45* 3.47*  CALCIUM 7.9* 7.8*  --  7.7* 7.7* PENDING  MG 2.4 2.4 2.2  --  2.3 2.0  PHOS 8.2* 7.4*  --  6.5* 5.8* 5.8*    Liver Function Tests:  Recent Labs Lab 09/06/2016 0750 09/09/16 1629 09/09/16 2048 09/10/16 0038 09/10/16 0424 09/10/16 0922  AST 102*  --   --   --   --   --   ALT 60  --   --   --   --   --   ALKPHOS 93  --   --   --   --   --   BILITOT 1.4*  --   --   --   --   --   PROT 7.0  --   --   --   --   --   ALBUMIN 3.1* 3.3* 3.2* 3.0* 3.0* 3.0*   No results for input(s): LIPASE, AMYLASE in the last 168 hours. No results for input(s): AMMONIA in the last 168 hours.  CBC:  Recent Labs Lab 08/31/2016 0750 09/08/16 0152 09/09/16 0431  WBC 15.4* 8.2 12.1*  NEUTROABS 12.6*  --   --   HGB 13.2 13.3 12.8*  HCT 40.7 41.3 39.8*  MCV 80.8 80.5 80.1  PLT 257 178 185    Cardiac Enzymes:  Recent Labs Lab 09/20/2016 0750 08/31/2016 1208 08/30/2016 1632 09/08/16 0152 09/08/16 0532  TROPONINI 0.08* 0.44* 0.47* 0.21* 0.15*    Lipid Panel: No results for input(s): CHOL, TRIG, HDL, CHOLHDL, VLDL, LDLCALC in the last 168 hours.  CBG:  Recent Labs Lab 09/09/16 1954 09/09/16 2341 09/10/16 0343 09/10/16 0721 09/10/16 1123  GLUCAP 123* 142* 125* 49* 133*    Microbiology: Results for orders placed or performed during the hospital encounter of 09/05/2016  Culture, blood (routine x 2)     Status: None (Preliminary result)   Collection Time: 09/02/2016  8:18 AM  Result Value Ref Range Status   Specimen Description BLOOD L AC  Final   Special Requests   Final    BOTTLES DRAWN AEROBIC AND ANAEROBIC Blood Culture adequate volume   Culture NO GROWTH 3 DAYS  Final   Report Status PENDING  Incomplete  MRSA PCR Screening     Status: None    Collection Time: 09/12/2016  9:46 AM  Result Value Ref Range Status   MRSA by PCR NEGATIVE NEGATIVE Final    Comment:        The GeneXpert MRSA Assay (FDA approved for NASAL specimens only), is one component of a comprehensive MRSA colonization surveillance program. It is not intended to diagnose MRSA infection nor to guide or monitor treatment for MRSA infections.   Culture, blood (routine x 2)     Status: None (Preliminary result)   Collection Time: 09/03/2016 11:45 AM  Result Value Ref Range Status   Specimen Description BLOOD L HAND  Final   Special Requests   Final    BOTTLES DRAWN AEROBIC ONLY Blood Culture results may not be optimal due to an inadequate volume of blood received in culture bottles   Culture NO GROWTH 3 DAYS  Final   Report Status PENDING  Incomplete  Culture, respiratory (NON-Expectorated)     Status: None   Collection Time: 09/17/2016  6:32 PM  Result Value Ref Range Status   Specimen Description TRACHEAL ASPIRATE  Final   Special Requests NONE  Final   Gram Stain   Final    FEW WBC PRESENT, PREDOMINANTLY PMN MODERATE GRAM POSITIVE COCCI IN CHAINS IN PAIRS MODERATE GRAM NEGATIVE RODS    Culture   Final    Consistent with normal respiratory flora. Performed at [facility], [place] [address] [place], [place] [address]    Report Status 09/10/2016 FINAL  Final    Coagulation Studies:  Recent Labs  09/03/2016 1830  LABPROT 17.2*  INR 1.39    Imaging: Dg Chest 1 View  Result Date: 09/09/2016 CLINICAL DATA:  Dyspnea EXAM: CHEST 1 VIEW COMPARISON:  09/08/2016 FINDINGS: Endotracheal tube and nasogastric catheter are noted in satisfactory position. Left jugular central line is noted although the tip does not appear to extend into the innominate vein. A right jugular central line is noted extending into the right atrium. Mild vascular congestion is seen with mild interstitial edema. No focal infiltrate is seen no pneumothorax is noted. IMPRESSION: Stable  changes in the lungs. New right jugular central line is seen without pneumothorax. Electronically Signed   By: Inez Catalina M.D.   On: 09/09/2016 12:09   Ct Head Wo Contrast  Result Date: 09/09/2016 CLINICAL DATA:  Anoxic encephalopathy. EXAM: CT HEAD WITHOUT CONTRAST TECHNIQUE: Contiguous axial images were obtained from the base of the skull through the vertex without  intravenous contrast. COMPARISON:  CT scan of September 07, 2016. FINDINGS: Brain: No midline shift is noted. There is loss of gray-white matter differentiation as well as sulcal effacement involving the cerebral cortices bilaterally consistent with diffuse cerebral edema, most likely due to anoxic injury. No hemorrhage is noted. Vascular: No hyperdense vessel or unexpected calcification. Skull: Normal. Negative for fracture or focal lesion. Sinuses/Orbits: Multiple small bilateral maxillary mucous retention cysts are noted. Other: None. IMPRESSION: There has been interval loss of gray-white matter differentiation as well as sulcal effacement involving the cerebral cortices bilaterally consistent with diffuse cerebral edema, most likely due to anoxic injury. Critical Value/emergent results were called by telephone at the time of interpretation on 09/09/2016 at 1:45 pm to [place], the patient's nurse, who verbally acknowledged these results and will contact the ordering physician. Electronically Signed   By: Marijo Conception, M.D.   On: 09/09/2016 13:46    Medications:  I have reviewed the patient's current medications. Scheduled: . bisacodyl  10 mg Rectal Once  . budesonide (PULMICORT) nebulizer solution  0.5 mg Nebulization BID  . chlorhexidine gluconate (MEDLINE KIT)  15 mL Mouth Rinse BID  . diltiazem  60 mg Oral Q8H  . feeding supplement (VITAL HIGH PROTEIN)  1,000 mL Per Tube Q24H  . furosemide  60 mg Intravenous Q8H  . insulin aspart  2-6 Units Subcutaneous Q4H  . insulin glargine  10 Units Subcutaneous Q24H  .  ipratropium-albuterol  3 mL Nebulization Q6H  . mouth rinse  15 mL Mouth Rinse 10 times per day  . pantoprazole sodium  40 mg Per Tube Daily  . sodium chloride flush  10-40 mL Intracatheter Q12H    Assessment/Plan: Patient remains unresponsive.  Has recieved Vecuronium twice today.  Repeat imaging shows some decrease in the grey-white differentiation.  EEG with PLEDs, GPEDs.  Patient started on Depakote.  Recommendations: 1.  Would continue to evaluate patient until has been normothermic for at least 72 hours.   2.  Depakote level with level to be maintained at high normal.  Will adjust based on level.  Once level high therapeutic would consider repeat of EEG. 3.  MRI imaging if possible.  Otherwise would repeat CT in 2-3 days.     LOS: 3 days   Alexis Goodell, MD Neurology [phone] 09/10/2016  12:30 PM

## 2016-09-11 ENCOUNTER — Inpatient Hospital Stay (HOSPITAL_COMMUNITY): Payer: BLUE CROSS/BLUE SHIELD

## 2016-09-11 ENCOUNTER — Inpatient Hospital Stay: Payer: BLUE CROSS/BLUE SHIELD

## 2016-09-11 DIAGNOSIS — I469 Cardiac arrest, cause unspecified: Secondary | ICD-10-CM

## 2016-09-11 LAB — RENAL FUNCTION PANEL
ANION GAP: 10 (ref 5–15)
ANION GAP: 10 (ref 5–15)
ANION GAP: 8 (ref 5–15)
Albumin: 2.8 g/dL — ABNORMAL LOW (ref 3.5–5.0)
Albumin: 2.8 g/dL — ABNORMAL LOW (ref 3.5–5.0)
Albumin: 2.5 g/dL — ABNORMAL LOW (ref 3.5–5.0)
Albumin: 2.8 g/dL — ABNORMAL LOW (ref 3.5–5.0)
Albumin: 2.7 g/dL — ABNORMAL LOW (ref 3.5–5.0)
Albumin: 2.6 g/dL — ABNORMAL LOW (ref 3.5–5.0)
Anion gap: 9 (ref 5–15)
Anion gap: 7 (ref 5–15)
Anion gap: 8 (ref 5–15)
BUN: 41 mg/dL — ABNORMAL HIGH (ref 6–20)
BUN: 41 mg/dL — AB (ref 6–20)
BUN: 41 mg/dL — ABNORMAL HIGH (ref 6–20)
BUN: 46 mg/dL — ABNORMAL HIGH (ref 6–20)
BUN: 39 mg/dL — AB (ref 6–20)
BUN: 48 mg/dL — ABNORMAL HIGH (ref 6–20)
CALCIUM: 7.8 mg/dL — AB (ref 8.9–10.3)
CALCIUM: 7.5 mg/dL — AB (ref 8.9–10.3)
CALCIUM: 8.2 mg/dL — AB (ref 8.9–10.3)
CHLORIDE: 100 mmol/L — AB (ref 101–111)
CHLORIDE: 100 mmol/L — AB (ref 101–111)
CHLORIDE: 98 mmol/L — AB (ref 101–111)
CHLORIDE: 99 mmol/L — AB (ref 101–111)
CO2: 24 mmol/L (ref 22–32)
CO2: 25 mmol/L (ref 22–32)
CO2: 26 mmol/L (ref 22–32)
CO2: 26 mmol/L (ref 22–32)
CO2: 27 mmol/L (ref 22–32)
CO2: 29 mmol/L (ref 22–32)
CREATININE: 3.01 mg/dL — AB (ref 0.61–1.24)
CREATININE: 3.07 mg/dL — AB (ref 0.61–1.24)
CREATININE: 3.64 mg/dL — AB (ref 0.61–1.24)
Calcium: 7.9 mg/dL — ABNORMAL LOW (ref 8.9–10.3)
Calcium: 8.1 mg/dL — ABNORMAL LOW (ref 8.9–10.3)
Calcium: 8 mg/dL — ABNORMAL LOW (ref 8.9–10.3)
Chloride: 99 mmol/L — ABNORMAL LOW (ref 101–111)
Chloride: 99 mmol/L — ABNORMAL LOW (ref 101–111)
Creatinine, Ser: 3.26 mg/dL — ABNORMAL HIGH (ref 0.61–1.24)
Creatinine, Ser: 3.33 mg/dL — ABNORMAL HIGH (ref 0.61–1.24)
Creatinine, Ser: 3.56 mg/dL — ABNORMAL HIGH (ref 0.61–1.24)
GFR calc Af Amer: 26 mL/min — ABNORMAL LOW (ref >60)
GFR calc Af Amer: 22 mL/min — ABNORMAL LOW (ref >60)
GFR calc non Af Amer: 23 mL/min — ABNORMAL LOW (ref >60)
GFR calc non Af Amer: 22 mL/min — ABNORMAL LOW (ref >60)
GFR calc non Af Amer: 20 mL/min — ABNORMAL LOW (ref >60)
GFR calc non Af Amer: 19 mL/min — ABNORMAL LOW (ref >60)
GFR, EST AFRICAN AMERICAN: 21 mL/min — AB (ref >60)
GFR, EST AFRICAN AMERICAN: 24 mL/min — AB (ref >60)
GFR, EST AFRICAN AMERICAN: 24 mL/min — AB (ref >60)
GFR, EST AFRICAN AMERICAN: 27 mL/min — AB (ref >60)
GFR, EST NON AFRICAN AMERICAN: 18 mL/min — AB (ref >60)
GFR, EST NON AFRICAN AMERICAN: 21 mL/min — AB (ref >60)
GLUCOSE: 141 mg/dL — AB (ref 65–99)
Glucose, Bld: 204 mg/dL — ABNORMAL HIGH (ref 65–99)
Glucose, Bld: 127 mg/dL — ABNORMAL HIGH (ref 65–99)
Glucose, Bld: 137 mg/dL — ABNORMAL HIGH (ref 65–99)
Glucose, Bld: 146 mg/dL — ABNORMAL HIGH (ref 65–99)
Glucose, Bld: 137 mg/dL — ABNORMAL HIGH (ref 65–99)
PHOSPHORUS: 4.6 mg/dL (ref 2.5–4.6)
PHOSPHORUS: 3.5 mg/dL (ref 2.5–4.6)
POTASSIUM: 3.9 mmol/L (ref 3.5–5.1)
Phosphorus: 5 mg/dL — ABNORMAL HIGH (ref 2.5–4.6)
Phosphorus: 5 mg/dL — ABNORMAL HIGH (ref 2.5–4.6)
Phosphorus: 4.6 mg/dL (ref 2.5–4.6)
Phosphorus: 4.8 mg/dL — ABNORMAL HIGH (ref 2.5–4.6)
Potassium: 3.9 mmol/L (ref 3.5–5.1)
Potassium: 4 mmol/L (ref 3.5–5.1)
Potassium: 3.8 mmol/L (ref 3.5–5.1)
Potassium: 4 mmol/L (ref 3.5–5.1)
Potassium: 3.8 mmol/L (ref 3.5–5.1)
SODIUM: 134 mmol/L — AB (ref 135–145)
SODIUM: 133 mmol/L — AB (ref 135–145)
Sodium: 134 mmol/L — ABNORMAL LOW (ref 135–145)
Sodium: 135 mmol/L (ref 135–145)
Sodium: 134 mmol/L — ABNORMAL LOW (ref 135–145)
Sodium: 134 mmol/L — ABNORMAL LOW (ref 135–145)

## 2016-09-11 LAB — COMPREHENSIVE METABOLIC PANEL
ALBUMIN: 2.8 g/dL — AB (ref 3.5–5.0)
ALK PHOS: 72 U/L (ref 38–126)
ALT: 23 U/L (ref 17–63)
ANION GAP: 8 (ref 5–15)
AST: 40 U/L (ref 15–41)
BILIRUBIN TOTAL: 0.7 mg/dL (ref 0.3–1.2)
BUN: 41 mg/dL — AB (ref 6–20)
CALCIUM: 7.8 mg/dL — AB (ref 8.9–10.3)
CO2: 28 mmol/L (ref 22–32)
CREATININE: 3.22 mg/dL — AB (ref 0.61–1.24)
Chloride: 99 mmol/L — ABNORMAL LOW (ref 101–111)
GFR calc Af Amer: 25 mL/min — ABNORMAL LOW (ref >60)
GFR calc non Af Amer: 21 mL/min — ABNORMAL LOW (ref >60)
GLUCOSE: 127 mg/dL — AB (ref 65–99)
Potassium: 4 mmol/L (ref 3.5–5.1)
SODIUM: 135 mmol/L (ref 135–145)
TOTAL PROTEIN: 6.9 g/dL (ref 6.5–8.1)

## 2016-09-11 LAB — BLOOD GAS, ARTERIAL
Acid-base deficit: 0.3 mmol/L (ref 0.0–2.0)
Bicarbonate: 29.1 mmol/L — ABNORMAL HIGH (ref 20.0–28.0)
FIO2: 1
MECHANICAL RATE: 20
O2 SAT: 84.1 %
PCO2 ART: 71 mmHg — AB (ref 32.0–48.0)
PEEP: 12 cmH2O
Patient temperature: 37.1
VT: 500 mL
pH, Arterial: 7.22 — ABNORMAL LOW (ref 7.350–7.450)
pO2, Arterial: 59 mmHg — ABNORMAL LOW (ref 83.0–108.0)

## 2016-09-11 LAB — APTT: APTT: 91 s — AB (ref 24–36)

## 2016-09-11 LAB — GLUCOSE, CAPILLARY
GLUCOSE-CAPILLARY: 107 mg/dL — AB (ref 65–99)
GLUCOSE-CAPILLARY: 140 mg/dL — AB (ref 65–99)
Glucose-Capillary: 123 mg/dL — ABNORMAL HIGH (ref 65–99)
Glucose-Capillary: 101 mg/dL — ABNORMAL HIGH (ref 65–99)
Glucose-Capillary: 137 mg/dL — ABNORMAL HIGH (ref 65–99)

## 2016-09-11 LAB — CBC
HCT: 36.4 % — ABNORMAL LOW (ref 40.0–52.0)
Hemoglobin: 11.5 g/dL — ABNORMAL LOW (ref 13.0–18.0)
MCH: 25.7 pg — ABNORMAL LOW (ref 26.0–34.0)
MCHC: 31.7 g/dL — ABNORMAL LOW (ref 32.0–36.0)
MCV: 81.3 fL (ref 80.0–100.0)
PLATELETS: 202 10*3/uL (ref 150–440)
RBC: 4.48 MIL/uL (ref 4.40–5.90)
RDW: 17.4 % — AB (ref 11.5–14.5)
WBC: 12.8 10*3/uL — AB (ref 3.8–10.6)

## 2016-09-11 LAB — MAGNESIUM
MAGNESIUM: 1.8 mg/dL (ref 1.7–2.4)
MAGNESIUM: 1.9 mg/dL (ref 1.7–2.4)
MAGNESIUM: 2 mg/dL (ref 1.7–2.4)
MAGNESIUM: 2.2 mg/dL (ref 1.7–2.4)
Magnesium: 1.9 mg/dL (ref 1.7–2.4)
Magnesium: 1.9 mg/dL (ref 1.7–2.4)

## 2016-09-11 LAB — HEPARIN LEVEL (UNFRACTIONATED)
HEPARIN UNFRACTIONATED: 0.58 [IU]/mL (ref 0.30–0.70)
HEPARIN UNFRACTIONATED: 0.47 [IU]/mL (ref 0.30–0.70)
Heparin Unfractionated: 0.45 IU/mL (ref 0.30–0.70)

## 2016-09-11 MED ORDER — SODIUM CHLORIDE 0.9% FLUSH
10.0000 mL | INTRAVENOUS | Status: DC | PRN
  Administered 2016-09-17 – 2016-09-18 (×3): 10 mL
  Filled 2016-09-11 (×3): qty 40

## 2016-09-11 MED ORDER — VITAL HIGH PROTEIN PO LIQD: 1000.0000 mL | ORAL | Status: DC

## 2016-09-11 MED ORDER — VALPROATE SODIUM 500 MG/5ML IV SOLN
1000.0000 mg | Freq: Three times a day (TID) | INTRAVENOUS | Status: DC
  Administered 2016-09-11 – 2016-09-15 (×12): 1000 mg via INTRAVENOUS
  Filled 2016-09-11 (×15): qty 10

## 2016-09-11 MED ORDER — DEXTROSE 5 % IV SOLN
500.0000 mg | Freq: Once | INTRAVENOUS | Status: AC
  Administered 2016-09-11: 500 mg via INTRAVENOUS
  Filled 2016-09-11: qty 5

## 2016-09-11 MED ORDER — PRO-STAT SUGAR FREE PO LIQD
30.0000 mL | ORAL | Status: DC
  Administered 2016-09-11 – 2016-09-13 (×10): 30 mL via ORAL

## 2016-09-11 MED ORDER — MILRINONE LACTATE IN DEXTROSE 20-5 MG/100ML-% IV SOLN
0.2500 ug/kg/min | INTRAVENOUS | Status: DC
  Administered 2016-09-11 – 2016-09-15 (×13): 0.25 ug/kg/min via INTRAVENOUS
  Filled 2016-09-11 (×16): qty 100

## 2016-09-11 MED ORDER — SODIUM CHLORIDE 0.9 % IV SOLN
0.0000 ug/min | INTRAVENOUS | Status: DC
  Filled 2016-09-11: qty 1

## 2016-09-11 MED ORDER — SODIUM CHLORIDE 0.9 % IV SOLN
0.0000 ug/min | INTRAVENOUS | Status: DC
  Filled 2016-09-11: qty 4

## 2016-09-11 MED ORDER — SODIUM CHLORIDE 0.9% FLUSH
10.0000 mL | Freq: Two times a day (BID) | INTRAVENOUS | Status: DC
  Administered 2016-09-12 – 2016-09-13 (×2): 10 mL
  Administered 2016-09-13: 40 mL
  Administered 2016-09-14 – 2016-09-19 (×10): 10 mL

## 2016-09-11 MED ORDER — NOREPINEPHRINE BITARTRATE 1 MG/ML IV SOLN
0.0000 ug/min | INTRAVENOUS | Status: DC
  Administered 2016-09-11: 5 ug/min via INTRAVENOUS
  Administered 2016-09-11: 37 ug/min via INTRAVENOUS
  Administered 2016-09-11: 35 ug/min via INTRAVENOUS
  Administered 2016-09-12: 50 ug/min via INTRAVENOUS
  Administered 2016-09-12: 40 ug/min via INTRAVENOUS
  Administered 2016-09-12: 50 ug/min via INTRAVENOUS
  Administered 2016-09-13: 65 ug/min via INTRAVENOUS
  Administered 2016-09-13: 75 ug/min via INTRAVENOUS
  Administered 2016-09-13 (×2): 65 ug/min via INTRAVENOUS
  Administered 2016-09-14: 55 ug/min via INTRAVENOUS
  Administered 2016-09-14 (×2): 75 ug/min via INTRAVENOUS
  Administered 2016-09-14: 40 ug/min via INTRAVENOUS
  Administered 2016-09-14: 35 ug/min via INTRAVENOUS
  Administered 2016-09-15: 38 ug/min via INTRAVENOUS
  Administered 2016-09-17: 10 ug/min via INTRAVENOUS
  Administered 2016-09-18: 14 ug/min via INTRAVENOUS
  Administered 2016-09-19: 50 ug/min via INTRAVENOUS
  Filled 2016-09-11 (×26): qty 16

## 2016-09-11 MED ORDER — SODIUM CHLORIDE 0.9 % IV SOLN
0.4000 ug/kg/h | INTRAVENOUS | Status: DC
  Administered 2016-09-11 (×4): 0.7 ug/kg/h via INTRAVENOUS
  Administered 2016-09-11: 0.4 ug/kg/h via INTRAVENOUS
  Administered 2016-09-11 – 2016-09-12 (×3): 0.7 ug/kg/h via INTRAVENOUS
  Administered 2016-09-12 (×2): 0.6 ug/kg/h via INTRAVENOUS
  Administered 2016-09-12 (×6): 0.7 ug/kg/h via INTRAVENOUS
  Administered 2016-09-12: 0.692 ug/kg/h via INTRAVENOUS
  Administered 2016-09-12 (×3): 0.7 ug/kg/h via INTRAVENOUS
  Administered 2016-09-12: 0.692 ug/kg/h via INTRAVENOUS
  Administered 2016-09-12 – 2016-09-13 (×2): 0.7 ug/kg/h via INTRAVENOUS
  Administered 2016-09-13 (×2): 0.8 ug/kg/h via INTRAVENOUS
  Administered 2016-09-13 (×2): 0.7 ug/kg/h via INTRAVENOUS
  Filled 2016-09-11 (×37): qty 2

## 2016-09-11 NOTE — Progress Notes (Signed)
ANTICOAGULATION CONSULT NOTE   Pharmacy Consult for heparin drip management  Indication: elevated tropinins   Pharmacy consulted for heparin drip management for 51 yo male admitted s/p vfib arrest. Patient had CPR x 3 rounds. Patient intubated and sedated; s/p targeted temperature management and now being initiated on CRRT. Patient takes apixaban as an outpatient currently has elevated anti-Xa level. Patient currently receiving heparin at 1600 units/hr.   Goal:  APTT: 68-109 anti-Xa: 0.3-0.7 Monitor platelets per protocol   Plan:  Will continue to monitor via anti-Xa. Will continue patient on heparin at 1600 units/hr. Will confirm anti-Xa level at 2000 on 6/15.    No Known Allergies  Patient Measurements: Height: 6' (182.9 cm) Weight: (!) 483 lb (219.1 kg) IBW/kg (Calculated) : 77.6 Heparin Dosing Weight: 129kg  Vital Signs: Temp: 99.3 F (37.4 C) (06/15 1200) Temp Source: Core (Comment) (06/15 0600) BP: 79/64 (06/15 1230) Pulse Rate: 65 (06/15 1230)  Labs:  Recent Labs  09/09/16 0431  09/10/16 0158  09/10/16 [id number]  09/10/16 1634  09/11/16 0034 09/11/16 0458 09/11/16 0822 09/11/16 1215  HGB 12.8*  --   --   --   --   --   --   --   --  11.5*  --   --   HCT 39.8*  --   --   --   --   --   --   --   --  36.4*  --   --   PLT 185  --   --   --   --   --   --   --   --  202  --   --   APTT  --   < > 66*  --  59*  --  63*  --  91*  --   --   --   HEPARINUNFRC 1.67*  --  0.85*  --   --   --   --   --   --  0.58  --  0.45  CREATININE 3.47*  < >  --   < > 3.47*  < > 3.37*  < > 3.26* 3.01*  3.22* 3.07* 3.33*  < > = values in this interval not displayed.  Estimated Creatinine Clearance: 51.5 mL/min (A) (by C-G formula based on SCr of 3.33 mg/dL (H)).   Pharmacy will continue to monitor and adjust per consult.   MLS  09/11/16 1:29 PM

## 2016-09-11 NOTE — Progress Notes (Signed)
Peripherally Inserted Central Catheter/Midline Placement  The IV Nurse has discussed with the patient and/or persons authorized to consent for the patient, the purpose of this procedure and the potential benefits and risks involved with this procedure.  The benefits include less needle sticks, lab draws from the catheter, and the patient may be discharged home with the catheter. Risks include, but not limited to, infection, bleeding, blood clot (thrombus formation), and puncture of an artery; nerve damage and irregular heartbeat and possibility to perform a PICC exchange if needed/ordered by physician.  Alternatives to this procedure were also discussed.  Bard Power PICC patient education guide, fact sheet on infection prevention and patient information card has been provided to patient /or left at bedside.    PICC/Midline Placement Documentation  PICC Triple Lumen 09/11/16 PICC Left Basilic 55 cm 0 cm (Active)       Netta Corriganhomas, Ules Marsala L 09/11/2016, 7:05 PM

## 2016-09-11 NOTE — Progress Notes (Signed)
Subjective: Patient remains intubated and unresponsive.  Just recently taken off Propofol.  Remains on Fentanyl.    Objective: Current vital signs: BP (!) 108/59   Pulse 89   Temp 98.8 F (37.1 C)   Resp (!) 32   Ht 6' (1.829 m)   Wt (!) 219.1 kg (483 lb)   SpO2 93%   BMI 65.51 kg/m  Vital signs in last 24 hours: Temp:  [97.5 F (36.4 C)-99.9 F (37.7 C)] 98.8 F (37.1 C) (06/15 1100) Pulse Rate:  [25-140] 89 (06/15 1130) Resp:  [20-37] 32 (06/15 1130) BP: (58-130)/(35-100) 108/59 (06/15 1130) SpO2:  [86 %-96 %] 93 % (06/15 1130) FiO2 (%):  [100 %] 100 % (06/15 1111) Weight:  [219.1 kg (483 lb)] 219.1 kg (483 lb) (06/15 0410)  Intake/Output from previous day: 06/14 0701 - 06/15 0700 In: 4508.2 [I.V.:3538.7; NG/GT:804.5; IV Piggyback:165] Out: 515 [Urine:515] Intake/Output this shift: Total I/O In: 422.9 [I.V.:247.4; NG/GT:175.5] Out: 15 [Urine:15] Nutritional status: Diet NPO time specified  Neurologic Exam: Mental Status: Patient does not respond to verbal stimuli.  Does not respond to deep sternal rub.  Does not follow commands.  No verbalizations noted.  Cranial Nerves: II: patient does not respond confrontation bilaterally, pupils right 3 mm, left 3 mm,and reactive bilaterally III,IV,VI: doll's response absent bilaterally.  V,VII: corneal reflex absent bilaterally  VIII: patient does not respond to verbal stimuli IX,X: gag reflex reduced, XI: trapezius strength unable to test bilaterally XII: tongue strength unable to test Motor: Extremities flaccid throughout.  No spontaneous movement noted.  No purposeful movements noted. Sensory: Does not respond to noxious stimuli in any extremity. Deep Tendon Reflexes:  1+ throughout Plantars: Mute  bilaterally Cerebellar: Unable to perform  Lab Results: Basic Metabolic Panel:  Recent Labs Lab 09/10/16 1634 09/10/16 2030 09/11/16 0034 09/11/16 0458 09/11/16 0822  NA 136 137 134* 135  135 134*  K 3.9 3.8  3.9 4.0  4.0 3.8  CL 100* 103 98* 99*  99* 100*  CO2 _0 GLUCOSE 128* 167* 204* 127*  127* 137*  BUN 46* 44* 41* 41*  41* 39*  CREATININE 3.37* 3.28* 3.26* 3.01*  3.22* 3.07*  CALCIUM 7.7* 7.5* 7.9* 7.8*  7.8* 7.5*  MG 1.9 1.9 2.2 2.0 1.9  PHOS 5.1* 5.2* 5.0* 5.0* 4.6    Liver Function Tests:  Recent Labs Lab 08/28/2016 0750  09/10/16 1634 09/10/16 2030 09/11/16 0034 09/11/16 0458 09/11/16 0822  AST 102*  --   --   --   --  40  --   ALT 60  --   --   --   --  23  --   ALKPHOS 93  --   --   --   --  72  --   BILITOT 1.4*  --   --   --   --  0.7  --   PROT 7.0  --   --   --   --  6.9  --   ALBUMIN 3.1*  < > 2.8* 2.8* 2.8* 2.8*  2.8* 2.5*  < > = values in this interval not displayed. No results for input(s): LIPASE, AMYLASE in the last 168 hours. No results for input(s): AMMONIA in the last 168 hours.  CBC:  Recent Labs Lab 09/06/2016 0750 09/08/16 0152 09/09/16 0431 09/11/16 0458  WBC 15.4* 8.2 12.1* 12.8*  NEUTROABS 12.6*  --   --   --   HGB 13.2 13.3  12.8* 11.5*  HCT 40.7 41.3 39.8* 36.4*  MCV 80.8 80.5 80.1 81.3  PLT 257 178 185 202    Cardiac Enzymes:  Recent Labs Lab 09/03/2016 0750 08/31/2016 1208 08/31/2016 1632 09/08/16 0152 09/08/16 0532  TROPONINI 0.08* 0.44* 0.47* 0.21* 0.15*    Lipid Panel:  Recent Labs Lab 09/10/16 1258  TRIG 37    CBG:  Recent Labs Lab 09/10/16 1556 09/10/16 2015 09/10/16 2340 09/11/16 0324 09/11/16 0746  GLUCAP 109* 127* 154* 123* 107*    Microbiology: Results for orders placed or performed during the hospital encounter of 09/10/2016  Culture, blood (routine x 2)     Status: None (Preliminary result)   Collection Time: 09/26/2016  8:18 AM  Result Value Ref Range Status   Specimen Description BLOOD L AC  Final   Special Requests   Final    BOTTLES DRAWN AEROBIC AND ANAEROBIC Blood Culture adequate volume   Culture NO GROWTH 4 DAYS  Final   Report Status PENDING  Incomplete  MRSA PCR  Screening     Status: None   Collection Time: 09/08/2016  9:46 AM  Result Value Ref Range Status   MRSA by PCR NEGATIVE NEGATIVE Final    Comment:        The GeneXpert MRSA Assay (FDA approved for NASAL specimens only), is one component of a comprehensive MRSA colonization surveillance program. It is not intended to diagnose MRSA infection nor to guide or monitor treatment for MRSA infections.   Culture, blood (routine x 2)     Status: None (Preliminary result)   Collection Time: 09/04/2016 11:45 AM  Result Value Ref Range Status   Specimen Description BLOOD L HAND  Final   Special Requests   Final    BOTTLES DRAWN AEROBIC ONLY Blood Culture results may not be optimal due to an inadequate volume of blood received in culture bottles   Culture NO GROWTH 4 DAYS  Final   Report Status PENDING  Incomplete  Culture, respiratory (NON-Expectorated)     Status: None   Collection Time: 09/24/2016  6:32 PM  Result Value Ref Range Status   Specimen Description TRACHEAL ASPIRATE  Final   Special Requests NONE  Final   Gram Stain   Final    FEW WBC PRESENT, PREDOMINANTLY PMN MODERATE GRAM POSITIVE COCCI IN CHAINS IN PAIRS MODERATE GRAM NEGATIVE RODS    Culture   Final    Consistent with normal respiratory flora. Performed at [facility], [place] [address] [place], [place] [address]    Report Status 09/10/2016 FINAL  Final    Coagulation Studies: No results for input(s): LABPROT, INR in the last 72 hours.  Imaging: Dg Chest 1 View  Result Date: 09/11/2016 CLINICAL DATA:  Dyspnea, recent cardiac arrest, anoxic ischemic encephalopathy, end-stage CHF. EXAM: CHEST 1 VIEW COMPARISON:  Portable chest x-ray of September 09, 2016 FINDINGS: The lungs are mildly hypoinflated. The cardiac silhouette remains enlarged. The pulmonary vascularity is mildly engorged but less conspicuous today. The interstitial markings remain increased. The endotracheal tube tip lies 2.9 cm above the carina. The  esophagogastric tube tip projects below the inferior margin of the image. The right internal jugular venous catheter tip projects at the cavoatrial junction or in the right atrium. A left internal jugular venous catheter has its tip terminating at the junction of the left internal jugular vein with the left subclavian vein. IMPRESSION: CHF with mild interstitial edema. Bilateral hypoinflation. No pneumonia nor pleural effusion. The support tubes are  in reasonable position. Electronically Signed   By: David  Martinique M.D.   On: 09/11/2016 07:13   Dg Chest 1 View  Result Date: 09/09/2016 CLINICAL DATA:  Dyspnea EXAM: CHEST 1 VIEW COMPARISON:  09/08/2016 FINDINGS: Endotracheal tube and nasogastric catheter are noted in satisfactory position. Left jugular central line is noted although the tip does not appear to extend into the innominate vein. A right jugular central line is noted extending into the right atrium. Mild vascular congestion is seen with mild interstitial edema. No focal infiltrate is seen no pneumothorax is noted. IMPRESSION: Stable changes in the lungs. New right jugular central line is seen without pneumothorax. Electronically Signed   By: Inez Catalina M.D.   On: 09/09/2016 12:09   Ct Head Wo Contrast  Result Date: 09/09/2016 CLINICAL DATA:  Anoxic encephalopathy. EXAM: CT HEAD WITHOUT CONTRAST TECHNIQUE: Contiguous axial images were obtained from the base of the skull through the vertex without intravenous contrast. COMPARISON:  CT scan of September 07, 2016. FINDINGS: Brain: No midline shift is noted. There is loss of gray-white matter differentiation as well as sulcal effacement involving the cerebral cortices bilaterally consistent with diffuse cerebral edema, most likely due to anoxic injury. No hemorrhage is noted. Vascular: No hyperdense vessel or unexpected calcification. Skull: Normal. Negative for fracture or focal lesion. Sinuses/Orbits: Multiple small bilateral maxillary mucous retention  cysts are noted. Other: None. IMPRESSION: There has been interval loss of gray-white matter differentiation as well as sulcal effacement involving the cerebral cortices bilaterally consistent with diffuse cerebral edema, most likely due to anoxic injury. Critical Value/emergent results were called by telephone at the time of interpretation on 09/09/2016 at 1:45 pm to [place], the patient's nurse, who verbally acknowledged these results and will contact the ordering physician. Electronically Signed   By: Marijo Conception, M.D.   On: 09/09/2016 13:46    Medications:  I have reviewed the patient's current medications. Scheduled: . budesonide (PULMICORT) nebulizer solution  0.5 mg Nebulization BID  . chlorhexidine gluconate (MEDLINE KIT)  15 mL Mouth Rinse BID  . diltiazem  60 mg Oral Q8H  . docusate  100 mg Oral BID  . feeding supplement (VITAL HIGH PROTEIN)  1,000 mL Per Tube Q24H  . insulin aspart  2-6 Units Subcutaneous Q4H  . insulin glargine  10 Units Subcutaneous Q24H  . ipratropium-albuterol  3 mL Nebulization Q6H  . mouth rinse  15 mL Mouth Rinse 10 times per day  . pantoprazole sodium  40 mg Per Tube Daily  . sennosides  5 mL Oral BID  . sodium chloride flush  10-40 mL Intracatheter Q12H    Assessment/Plan: Patient will be warmed 72 hours as of tomorrow morning.  Patient now with sluggishly reactive pupils but no changes otherwise on exam.  With removal of sedation there has been noted some eye twitching-first on one side then on both-per nursing.  Depakote level 34.   Poor prognosis discussed at length with father.    Recommendations: 1.  Patient to be given a bolus of 568m Depakote now.  Maintenance to be increased to 10034mIV q 8hours 2.  Depakote level in AM  Case discussed with Dr. RaAshby Dawes LOS: 4 days   LeAlexis GoodellMD Neurology [phone]/15/2018  11:50 AM

## 2016-09-11 NOTE — Progress Notes (Signed)
Nutrition Follow-up  DOCUMENTATION CODES:   Morbid obesity  INTERVENTION:   -Diprivan off. Recommend changing to Vital High Protein at goal of 60 ml/hr with Prostat 30 mL q 4 hours ( 6 packets per day) providing 2040 kcals, 217 g of protein. Continue to assess  NUTRITION DIAGNOSIS:   Inadequate oral intake related to inability to eat (sedated on vent) as evidenced by NPO status.  Being addressed via TF  GOAL:   Provide needs based on ASPEN/SCCM guidelines  Met  MONITOR:   Vent status, Labs, Weight trends, I & O's  REASON FOR ASSESSMENT:   Ventilator    ASSESSMENT:   51 y/o morbidly obese male with h/o DM, CHF, admitted for cardiac arrest of unclear etiology.  Pt remains criticailly ill on vent, multi organ failure; CRRT off at this time due to worsening hemodynamics, remains on pressors  Vital High Protein infusing at rate of 30 ml/hr, diprivan off at this time  Labs: sodium 134, potassium wdl, phosphorus 4.8 Meds: levophed, phenylephrine, precedex, fentanyl, diprivan  Diet Order:  Diet NPO time specified  Skin:  Reviewed, no issues  Last BM:  none since admit   Height:   Ht Readings from Last 1 Encounters:  09/01/2016 6' (1.829 m)    Weight:   Wt Readings from Last 1 Encounters:  09/11/16 (!) 483 lb (219.1 kg)    Ideal Body Weight:  80.9 kg  BMI:  Body mass index is 65.51 kg/m.  Estimated Nutritional Needs:   Kcal:  1800-2000kcal/day (22-25kcal/day IBW)  Protein:  >200g/day   Fluid:  99m/kcal/day  EDUCATION NEEDS:   Education needs no appropriate at this time  CKerman PasseyMEdgewood RTivoli LDN [phone]  [phone]/On-Call Pager

## 2016-09-11 NOTE — Progress Notes (Signed)
Chaplain visited with patient family and provided pastoral support and spiritual presence while patient underwent doctor's care. Offered silent prayer at door way of patients room.    09/11/16 1730  Clinical Encounter Type  Visited With Patient and family together  Visit Type Follow-up;Spiritual support  Referral From Chaplain  Consult/Referral To Chaplain  Spiritual Encounters  Spiritual Needs Prayer;Emotional

## 2016-09-11 NOTE — Progress Notes (Signed)
CH. Visited with Pt. and family for emotional and spiritual support. CH. Prayed with family.

## 2016-09-11 NOTE — Progress Notes (Signed)
Patient having mild twitching under L eye after decreasing propofol. Propofol increased back 13 mcg and Dr. Nicholos Johnsamachandran notified. Per Dr. Nicholos Johnsamachandran continue to decrease sedation so we can assess neuro status, and monitor for any other changes or increase in facial twitching. Propofol decreased to 9 mcg. Will continue to assess. Trudee KusterBrandi R [place]field

## 2016-09-11 NOTE — Progress Notes (Signed)
[facility]  ROUNDING NOTE   Subjective:   Parents at bedside.  On CRRT UF 0 UOP 510  Added phenylephrine last night On norepinephrine gtt  Objective:  Vital signs in last 24 hours:  Temp:  [97.5 F (36.4 C)-99.9 F (37.7 C)] 98.6 F (37 C) (06/15 0900) Pulse Rate:  [25-140] 36 (06/15 0930) Resp:  [20-37] 24 (06/15 0930) BP: (58-113)/(35-88) 91/67 (06/15 0930) SpO2:  [86 %-96 %] 93 % (06/15 0930) FiO2 (%):  [100 %] 100 % (06/15 0710) Weight:  [219.1 kg (483 lb)] 219.1 kg (483 lb) (06/15 0410)  Weight change: 2.268 kg (5 lb) Filed Weights   09/09/16 0416 09/10/16 0400 09/11/16 0410  Weight: (!) 191.4 kg (422 lb) (!) 216.8 kg (478 lb) (!) 219.1 kg (483 lb)    Intake/Output: I/O last 3 completed shifts: In: 5954.9 [I.V.:3990.9; NG/GT:1799; IV Piggyback:165] Out: 1310 [Urine:1310]   Intake/Output this shift:  No intake/output data recorded.  Physical Exam: General: Critically ill, on arctic sun  Head: ETT  Eyes: Eyes closed  Neck: JVD not able to be examined due to obesity  Lungs:  Diminished, PRVC FiO2 100%  Heart: Irregular, tachycardia  Abdomen:  Soft, nontender, obese  Extremities: No peripheral edema.  Neurologic: Intubated, sedated  Skin: No lesions  Access: RIJ temp HD catheter 6/13 Dr. Ashby Dawes    Basic Metabolic Panel:  Recent Labs Lab 09/10/16 1634 09/10/16 2030 09/11/16 0034 09/11/16 0458 09/11/16 0822  NA 136 137 134* 135  135 134*  K 3.9 3.8 3.9 4.0  4.0 3.8  CL 100* 103 98* 99*  99* 100*  CO2 29 27 27 29  28 26   GLUCOSE 128* 167* 204* 127*  127* 137*  BUN 46* 44* 41* 41*  41* 39*  CREATININE 3.37* 3.28* 3.26* 3.01*  3.22* 3.07*  CALCIUM 7.7* 7.5* 7.9* 7.8*  7.8* 7.5*  MG 1.9 1.9 2.2 2.0 1.9  PHOS 5.1* 5.2* 5.0* 5.0* 4.6    Liver Function Tests:  Recent Labs Lab 09/01/2016 0750  09/10/16 1634 09/10/16 2030 09/11/16 0034 09/11/16 0458 09/11/16 0822  AST 102*  --   --   --   --  40  --   ALT 60  --    --   --   --  23  --   ALKPHOS 93  --   --   --   --  72  --   BILITOT 1.4*  --   --   --   --  0.7  --   PROT 7.0  --   --   --   --  6.9  --   ALBUMIN 3.1*  < > 2.8* 2.8* 2.8* 2.8*  2.8* 2.5*  < > = values in this interval not displayed. No results for input(s): LIPASE, AMYLASE in the last 168 hours. No results for input(s): AMMONIA in the last 168 hours.  CBC:  Recent Labs Lab 09/17/2016 0750 09/08/16 0152 09/09/16 0431 09/11/16 0458  WBC 15.4* 8.2 12.1* 12.8*  NEUTROABS 12.6*  --   --   --   HGB 13.2 13.3 12.8* 11.5*  HCT 40.7 41.3 39.8* 36.4*  MCV 80.8 80.5 80.1 81.3  PLT 257 178 185 202    Cardiac Enzymes:  Recent Labs Lab 08/31/2016 0750 09/09/2016 1208 09/18/2016 1632 09/08/16 0152 09/08/16 0532  TROPONINI 0.08* 0.44* 0.47* 0.21* 0.15*    BNP: Invalid input(s): POCBNP  CBG:  Recent Labs Lab 09/10/16 1556 09/10/16 2015 09/10/16 2340  09/11/16 0324 09/11/16 0746  GLUCAP 109* 127* 154* 21* 107*    Microbiology: Results for orders placed or performed during the hospital encounter of 08/30/2016  Culture, blood (routine x 2)     Status: None (Preliminary result)   Collection Time: 09/26/2016  8:18 AM  Result Value Ref Range Status   Specimen Description BLOOD L AC  Final   Special Requests   Final    BOTTLES DRAWN AEROBIC AND ANAEROBIC Blood Culture adequate volume   Culture NO GROWTH 4 DAYS  Final   Report Status PENDING  Incomplete  MRSA PCR Screening     Status: None   Collection Time: 08/29/2016  9:46 AM  Result Value Ref Range Status   MRSA by PCR NEGATIVE NEGATIVE Final    Comment:        The GeneXpert MRSA Assay (FDA approved for NASAL specimens only), is one component of a comprehensive MRSA colonization surveillance program. It is not intended to diagnose MRSA infection nor to guide or monitor treatment for MRSA infections.   Culture, blood (routine x 2)     Status: None (Preliminary result)   Collection Time: 09/06/2016 11:45 AM  Result  Value Ref Range Status   Specimen Description BLOOD L HAND  Final   Special Requests   Final    BOTTLES DRAWN AEROBIC ONLY Blood Culture results may not be optimal due to an inadequate volume of blood received in culture bottles   Culture NO GROWTH 4 DAYS  Final   Report Status PENDING  Incomplete  Culture, respiratory (NON-Expectorated)     Status: None   Collection Time: 09/23/2016  6:32 PM  Result Value Ref Range Status   Specimen Description TRACHEAL ASPIRATE  Final   Special Requests NONE  Final   Gram Stain   Final    FEW WBC PRESENT, PREDOMINANTLY PMN MODERATE GRAM POSITIVE COCCI IN CHAINS IN PAIRS MODERATE GRAM NEGATIVE RODS    Culture   Final    Consistent with normal respiratory flora. Performed at [facility], [place] [address] [place], [place] [address]    Report Status 09/10/2016 FINAL  Final    Coagulation Studies: No results for input(s): LABPROT, INR in the last 72 hours.  Urinalysis: No results for input(s): COLORURINE, LABSPEC, PHURINE, GLUCOSEU, HGBUR, BILIRUBINUR, KETONESUR, PROTEINUR, UROBILINOGEN, NITRITE, LEUKOCYTESUR in the last 72 hours.  Invalid input(s): APPERANCEUR    Imaging: Dg Chest 1 View  Result Date: 09/11/2016 CLINICAL DATA:  Dyspnea, recent cardiac arrest, anoxic ischemic encephalopathy, end-stage CHF. EXAM: CHEST 1 VIEW COMPARISON:  Portable chest x-ray of September 09, 2016 FINDINGS: The lungs are mildly hypoinflated. The cardiac silhouette remains enlarged. The pulmonary vascularity is mildly engorged but less conspicuous today. The interstitial markings remain increased. The endotracheal tube tip lies 2.9 cm above the carina. The esophagogastric tube tip projects below the inferior margin of the image. The right internal jugular venous catheter tip projects at the cavoatrial junction or in the right atrium. A left internal jugular venous catheter has its tip terminating at the junction of the left internal jugular vein with the left subclavian  vein. IMPRESSION: CHF with mild interstitial edema. Bilateral hypoinflation. No pneumonia nor pleural effusion. The support tubes are in reasonable position. Electronically Signed   By: David  Martinique M.D.   On: 09/11/2016 07:13   Dg Chest 1 View  Result Date: 09/09/2016 CLINICAL DATA:  Dyspnea EXAM: CHEST 1 VIEW COMPARISON:  09/08/2016 FINDINGS: Endotracheal tube and nasogastric catheter are noted  in satisfactory position. Left jugular central line is noted although the tip does not appear to extend into the innominate vein. A right jugular central line is noted extending into the right atrium. Mild vascular congestion is seen with mild interstitial edema. No focal infiltrate is seen no pneumothorax is noted. IMPRESSION: Stable changes in the lungs. New right jugular central line is seen without pneumothorax. Electronically Signed   By: Inez Catalina M.D.   On: 09/09/2016 12:09   Ct Head Wo Contrast  Result Date: 09/09/2016 CLINICAL DATA:  Anoxic encephalopathy. EXAM: CT HEAD WITHOUT CONTRAST TECHNIQUE: Contiguous axial images were obtained from the base of the skull through the vertex without intravenous contrast. COMPARISON:  CT scan of September 07, 2016. FINDINGS: Brain: No midline shift is noted. There is loss of gray-white matter differentiation as well as sulcal effacement involving the cerebral cortices bilaterally consistent with diffuse cerebral edema, most likely due to anoxic injury. No hemorrhage is noted. Vascular: No hyperdense vessel or unexpected calcification. Skull: Normal. Negative for fracture or focal lesion. Sinuses/Orbits: Multiple small bilateral maxillary mucous retention cysts are noted. Other: None. IMPRESSION: There has been interval loss of gray-white matter differentiation as well as sulcal effacement involving the cerebral cortices bilaterally consistent with diffuse cerebral edema, most likely due to anoxic injury. Critical Value/emergent results were called by telephone at the  time of interpretation on 09/09/2016 at 1:45 pm to Isaiah [name], the patient's nurse, who verbally acknowledged these results and will contact the ordering physician. Electronically Signed   By: Marijo Conception, M.D.   On: 09/09/2016 13:46     Medications:   . sodium chloride 10 mL/hr at 09/10/16 1800  . amiodarone 30 mg/hr (09/11/16 0742)  . fentaNYL infusion INTRAVENOUS 200 mcg/hr (09/11/16 0200)  . heparin 1,600 Units/hr (09/11/16 0524)  . milrinone    . norepinephrine (LEVOPHED) Adult infusion 24 mcg/min (09/11/16 0702)  . phenylephrine (NEO-SYNEPHRINE) Adult infusion 20 mcg/min (09/10/16 2232)  . propofol (DIPRIVAN) infusion Stopped (09/11/16 0943)  . pureflow 2,000 mL/hr at 09/11/16 0431  . valproate sodium    . valproate sodium     . budesonide (PULMICORT) nebulizer solution  0.5 mg Nebulization BID  . chlorhexidine gluconate (MEDLINE KIT)  15 mL Mouth Rinse BID  . diltiazem  60 mg Oral Q8H  . docusate  100 mg Oral BID  . feeding supplement (VITAL HIGH PROTEIN)  1,000 mL Per Tube Q24H  . insulin aspart  2-6 Units Subcutaneous Q4H  . insulin glargine  10 Units Subcutaneous Q24H  . ipratropium-albuterol  3 mL Nebulization Q6H  . mouth rinse  15 mL Mouth Rinse 10 times per day  . pantoprazole sodium  40 mg Per Tube Daily  . sennosides  5 mL Oral BID  . sodium chloride flush  10-40 mL Intracatheter Q12H   acetaminophen **OR** acetaminophen, fentaNYL, heparin, polyvinyl alcohol, sodium chloride flush  Assessment/ Plan:  Isaiah [name] is a 51 y.o. black male with diabetes mellitus type II, hyeprtension, congestive heart failure, atrial fibrillation,  who was admitted to [facility] on 09/23/2016 for Cardiac arrest ([place]) [I46.9]  1. Acute renal failure: on chronic kidney disease stage III baseline creatinine of 1.4 04/10/16.  Nonoliguric - Discontinue furosemide - Hold CRRT due to worsening hemodynamics.  - Monitor renal function, volume status, urine output and electrolytes. Low  threshold to restart hemodialysis  2. Cardiogenic shock with hypotension: on vasopressors: norepinephrine and phenylephrine - appreciate cards input  3. Respiratory Failure: intubated and  sedate. PRVC FiO2 100%  Overall prognosis is poor. Discussed with patient's parents.    LOS: [place], [place] 6/15/201810:14 AM

## 2016-09-11 NOTE — Progress Notes (Signed)
CRRT stopped at 1100 per Dr. Wynelle LinkKolluru. Will follow UOP and update Dr. Wynelle LinkKolluru later in the shift. Trudee KusterBrandi R Mansfield

## 2016-09-11 NOTE — Progress Notes (Signed)
Chaplain, on rounds, went to visit and check on patient. Patient was not awake. Offered silent prayer at the door.    09/11/16 1555  Clinical Encounter Type  Visited With Patient  Visit Type Follow-up;Spiritual support  Referral From Chaplain  Consult/Referral To Chaplain  Spiritual Encounters  Spiritual Needs Prayer

## 2016-09-11 NOTE — Progress Notes (Signed)
[facility] Critical Care Medicine Consultation    SYNOPSIS   51 yo male with DM, afib,  cardiac arrest at home with Vfib arrest. Resuscitated, s/p cooling. ARF on CRRT, hypotensive on pressors, florid vol overload and pulm edema on 100% on vent.   ASSESSMENT/PLAN     PULMONARY A:Acute hypoxic respiratory failure with Acute pulmonary edema secondary to cardiac arrest. Blood gas 7.22/71/59/29.1 Consistent with respiratory acidosis. P:   Vent rate was increased. Continue PEEP of 10.  VENTILATOR SETTINGS: Vent Mode: PRVC FiO2 (%):  [100 %] 100 % Set Rate:  [20 bmp-24 bmp] 24 bmp Vt Set:  [500 mL] 500 mL PEEP:  [10 cmH20-12 cmH20] 12 cmH20  CARDIOVASCULAR A: Cardiac and vfib arrest.  no evidence of acute MI, cardiology following Cardiomyopathy; Echo 09/10/16 EF=10% Atrial fibrillation, rate controlled. Cardiogenic shock with hypotension. P:  Continue  supportive measures.  Continue IV heparin, given history of atrial fibrillation. Continue levophed. Will add milrinone.   HEMODYNAMICS: CVP:  [13 mmHg-17 mmHg] 17 mmHg  RENAL A:  Oliguric acute renal failure. P:   CRRT initiated 6/13; continue per nephrology.   INTAKE / OUTPUT:  Intake/Output Summary (Last 24 hours) at 09/11/16 1118 Last data filed at 09/11/16 1115  Gross per 24 hour  Intake          4351.37 ml  Output              380 ml  Net          3971.37 ml    GASTROINTESTINAL A:  -- HEMATOLOGIC A:  --  INFECTIOUS A:  -- P:    Micro/culture results:  BCx2 Negative to date UC -- Sputum pending  Antibiotics:   ENDOCRINE A:  DM , Blood glucose controlled P:   SSI  NEUROLOGIC A:  Acute anoxic encephalopathy; facial myoclonus;    P:   --Discussed with neurology, possible repeat EEG and/or CT scan. -Continue current measures, we'll reassess tomorrow, which will mark the 72 hour point.  MAJOR EVENTS/TEST RESULTS: 06/11:Cardiac arrest, admitted, intubated, CVL placed 06/12: Minimal  urine output, CVL retracted. 06/13; rewarming, possible seizures noted on decreasing sedation; CRRT initiated, CT head diffuse cerebral edema.   Best Practices  DVT Prophylaxis: IV heparin GI Prophylaxis: PPI  --------------------------------------- Discussed with wife at bedside, explained intensity of our interventions, explained lack of significant improvement in his multiorgan failure thus far. Also discussed baseline comorbidities, which were likely contributory, including cardiomyopathy, atrial fibrillation, diabetes mellitus, renal disease. ---------------------------------------   Name: Isaiah [name] MRN: [id number] DOB: September 06, 1965    ADMISSION DATE:  09-16-16 Subjective.  The patient is sedated and on a ventilator.  REVIEW OF SYSTEMS:   History could not be obtained due to unresponsiveness.   VITAL SIGNS: Temp:  [97.5 F (36.4 C)-99.9 F (37.7 C)] 98.8 F (37.1 C) (06/15 1000) Pulse Rate:  [25-140] 30 (06/15 1045) Resp:  [20-37] 31 (06/15 1045) BP: (58-130)/(35-100) 130/100 (06/15 1045) SpO2:  [86 %-96 %] 91 % (06/15 1045) FiO2 (%):  [100 %] 100 % (06/15 1111) Weight:  [219.1 kg (483 lb)] 219.1 kg (483 lb) (06/15 0410) HEMODYNAMICS: CVP:  [13 mmHg-17 mmHg] 17 mmHg VENTILATOR SETTINGS: Vent Mode: PRVC FiO2 (%):  [100 %] 100 % Set Rate:  [20 bmp-24 bmp] 24 bmp Vt Set:  [500 mL] 500 mL PEEP:  [10 cmH20-12 cmH20] 12 cmH20 INTAKE / OUTPUT:  Intake/Output Summary (Last 24 hours) at 09/11/16 1118 Last data filed at 09/11/16 1115  Gross per 24  hour  Intake          4351.37 ml  Output              380 ml  Net          3971.37 ml    Physical Examination:   VS: BP (!) 130/100   Pulse (!) 30   Temp 98.8 F (37.1 C)   Resp (!) 31   Ht 6' (1.829 m)   Wt (!) 219.1 kg (483 lb)   SpO2 91%   BMI 65.51 kg/m   General Appearance: No distress  Neuro:without focal findings,sedations. Negative oculocephalic reflex, negative gag, negative cough with deep  suctioning. HEENT: PERRLA, EOM intact, no ptosis, no other lesions noticed;  Pulmonary: decreased air entry bilaterally.  CardiovascularNormal S1,S2.  No m/r/g.    Abdomen: Benign, Soft, non-tender, No masses, hepatosplenomegaly, No lymphadenopathy Renal:  No costovertebral tenderness  GU:  Not performed at this time. Endoc: No evident thyromegaly, no signs of acromegaly. Skin:   warm, no rashes, no ecchymosis  Extremities: normal, no cyanosis, clubbing, no edema, warm with normal capillary refill.    LABS: Reviewed   LABORATORY PANEL:   CBC  Recent Labs Lab 09/11/16 0458  WBC 12.8*  HGB 11.5*  HCT 36.4*  PLT 202    Chemistries   Recent Labs Lab 09/11/16 0458 09/11/16 0822  NA 135  135 134*  K 4.0  4.0 3.8  CL 99*  99* 100*  CO2 29  28 26   GLUCOSE 127*  127* 137*  BUN 41*  41* 39*  CREATININE 3.01*  3.22* 3.07*  CALCIUM 7.8*  7.8* 7.5*  MG 2.0 1.9  PHOS 5.0* 4.6  AST 40  --   ALT 23  --   ALKPHOS 72  --   BILITOT 0.7  --      Recent Labs Lab 09/10/16 1123 09/10/16 1556 09/10/16 2015 09/10/16 2340 09/11/16 0324 09/11/16 0746  GLUCAP 133* 109* 127* 154* 123* 107*    Recent Labs Lab 09/08/16 0500 09/09/16 0500 09/11/16 0553  PHART 7.29* 7.24* 7.22*  PCO2ART 55* 65* 71*  PO2ART 168* 64* 59*    Recent Labs Lab Sep 20, 2016 0750  09/11/16 0034 09/11/16 0458 09/11/16 0822  AST 102*  --   --  40  --   ALT 60  --   --  23  --   ALKPHOS 93  --   --  72  --   BILITOT 1.4*  --   --  0.7  --   ALBUMIN 3.1*  < > 2.8* 2.8*  2.8* 2.5*  < > = values in this interval not displayed.  Cardiac Enzymes  Recent Labs Lab 09/08/16 0532  TROPONINI 0.15*    RADIOLOGY:       --Wells Guiles, MD.  Board Certified in Internal Medicine, Pulmonary Medicine, Critical Care Medicine, and Sleep Medicine.  ICU Pager [phone] [facility] Office Number: [phone]  Santiago Glad, M.D.  Billy Fischer,  M.D   09/11/2016, 11:18 AM  Critical Care Attestation.  I have personally obtained a history, examined the patient, evaluated laboratory and imaging results, formulated the assessment and plan and placed orders. The Patient requires high complexity decision making for assessment and support, frequent evaluation and titration of therapies, application of advanced monitoring technologies and extensive interpretation of multiple databases. The patient has critical illness that could lead imminently to failure of 1 or more organ systems and requires the highest level of  physician preparedness to intervene.  Critical Care Time devoted to patient care services described in this note is 45 minutes and is exclusive of time spent in procedures supervisory time of NP.

## 2016-09-11 NOTE — Progress Notes (Signed)
Patient noted to have swelling and firmness around central line at about 1600. Annabelle Harmanana, NP notified, gtts changed to purple port on trialysis cath. Verbal order received to place picc. Order placed. Patient was titrated off of neo and propofol today. Currently on precedex and fentanyl. Fentanyl titrated down to 150 mcg. Family updated on plan of care. BP stable at this time. Trudee KusterBrandi R Mansfield

## 2016-09-11 NOTE — Progress Notes (Signed)
Pt had issues with blood pressure during the night.  Levophed got as high as 940mcg/min.  NP added neo-synephrine and an A line was attempted but was not successful.  Levophed was changed to quad strength.  Urine output decreased to 100cc during the night. Pt remains on CRRT. Pt remained non-responsive. Sedation was decreased to assess pt's response.  Pt became asynchronus with the vent.  Sedation was returned to start of shift levels.

## 2016-09-11 NOTE — Progress Notes (Addendum)
ANTICOAGULATION CONSULT NOTE   Pharmacy Consult for heparin drip management  Indication: elevated tropinins   Pharmacy consulted for heparin drip management for 51 yo male admitted s/p vfib arrest. Patient had CPR x 3 rounds. Patient intubated and sedated; s/p targeted temperature management and now being initiated on CRRT. Patient takes apixaban as an outpatient currently has elevated anti-Xa level. Patient currently receiving heparin at 1500 units/hr.   Goal:  APTT: 68-109 anti-Xa: 0.3-0.7 Monitor platelets per protocol   Plan:  APTT = 63 Will increase rate to 1600 units/hr. Will obtain next aPTT in 6 hours. Will manage patient with aPTTs until anti-Xa and aPTT levels correlate. Will obtain next anti-Xa with morning aPTT on 6/15.   6/15 0030 aPTT 91. Continue current regimen. F/u with heparin level with AM labs. 6/15 AM heparin level 0.58, therapeutic. Appears to be correlating with aPTT from earlier. Recheck in 8 hours to confirm.   APTT (SEC) BOLUS DOSE STOP INFUSION (MINUTES) RATE CHANGE (mL/HR) REPEAT APTT  < 41 3000 units 0 + 1 ml/HR 6 HR  41 - 67 0 0 + 1 ml/HR 6 HR  68 - 109 0 0 NO CHANGE NEXT AM  110 - 136 0 0 - 1 ml/HR 6 HR  137 - 160 0 30 MIN - 1 ml/HR 6 HR  >160 0 60 MIN - 2 ml/HR 6 HR     No Known Allergies  Patient Measurements: Height: 6' (182.9 cm) Weight: (!) 478 lb (216.8 kg) IBW/kg (Calculated) : 77.6 Heparin Dosing Weight: 129kg  Vital Signs: Temp: 98.6 F (37 C) (06/15 0100) Temp Source: Core (Comment) (06/15 0100) BP: 104/59 (06/14 2310) Pulse Rate: 39 (06/14 2310)  Labs:  Recent Labs  09/08/16 0152  09/08/16 0532  09/09/16 0431  09/10/16 0158  09/10/16 0922 09/10/16 1258 09/10/16 1634 09/10/16 2030 09/11/16 0034  HGB 13.3  --   --   --  12.8*  --   --   --   --   --   --   --   --   HCT 41.3  --   --   --  39.8*  --   --   --   --   --   --   --   --   PLT 178  --   --   --  185  --   --   --   --   --   --   --   --   APTT  --   <  >  --   < >  --   < > 66*  --  59*  --  63*  --  91*  HEPARINUNFRC 2.72*  --   --   --  1.67*  --  0.85*  --   --   --   --   --   --   CREATININE 2.46*  --  2.67*  < > 3.47*  < >  --   < > 3.47* 3.45* 3.37* 3.28*  --   TROPONINI 0.21*  --  0.15*  --   --   --   --   --   --   --   --   --   --   < > = values in this interval not displayed.  Estimated Creatinine Clearance: 51.9 mL/min (A) (by C-G formula based on SCr of 3.28 mg/dL (H)).   Pharmacy will continue to monitor and adjust per consult.  Fulton Reek, PharmD, BCPS  09/11/16 1:29 AM

## 2016-09-11 NOTE — Progress Notes (Signed)
[facility] ( go to Chart Review> Media tab> EMS Run) indicates collapse to first CPR time 8 minutes; see file for additional details.

## 2016-09-11 NOTE — Progress Notes (Signed)
Patient ID: Isaiah [name], male   DOB: 02-Mar-1966, 51 y.o.   MRN: [id number]   [employer] PROGRESS NOTE  DEREON CORKERY GGY:[id number] DOB: 17-Mar-1966 DOA: 09/12/2016 PCP: Patient, No Pcp Per  HPI/Subjective: Patient unresponsive at this point.  Objective: Vitals:   09/11/16 1215 09/11/16 1230  BP: (!) 104/56 (!) 79/64  Pulse: 68 65  Resp: (!) 33 (!) 34  Temp:      Filed Weights   09/09/16 0416 09/10/16 0400 09/11/16 0410  Weight: (!) 191.4 kg (422 lb) (!) 216.8 kg (478 lb) (!) 219.1 kg (483 lb)    ROS: Review of Systems  Unable to perform ROS: Critical illness   Exam: Physical Exam  HENT:  Nose: No mucosal edema.  Unable to look into mouth at this time  Eyes: Conjunctivae and lids are normal.  Pupils pinpoint  Neck: Carotid bruit is not present. No thyroid mass and no thyromegaly present.  Cardiovascular: S1 normal and S2 normal.  An irregularly irregular rhythm present. Tachycardia present.  Exam reveals distant heart sounds. Exam reveals no gallop.   No murmur heard. Pulses:      Dorsalis pedis pulses are 2+ on the right side, and 2+ on the left side.  Respiratory: No respiratory distress. He has decreased breath sounds in the right middle field, the right lower field, the left middle field and the left lower field. He has wheezes in the right middle field and the left middle field. He has rhonchi in the right lower field and the left lower field. He has no rales.  GI: Soft. Bowel sounds are normal. There is no tenderness.  Musculoskeletal:       Right knee: He exhibits swelling.       Left knee: He exhibits swelling.       Right ankle: He exhibits swelling.       Left ankle: He exhibits swelling.  Lymphadenopathy:    He has no cervical adenopathy.  Neurological: He is unresponsive.  Unresponsive to painful stimuli  Skin: No rash noted. Nails show no clubbing.  Psychiatric:  Unable to assess at this time secondary to unresponsive      Data  Reviewed: Basic Metabolic Panel:  Recent Labs Lab 09/10/16 1634 09/10/16 2030 09/11/16 0034 09/11/16 0458 09/11/16 0822  NA 136 137 134* 135  135 134*  K 3.9 3.8 3.9 4.0  4.0 3.8  CL 100* 103 98* 99*  99* 100*  CO2 29 27 27 29  28 26   GLUCOSE 128* 167* 204* 127*  127* 137*  BUN 46* 44* 41* 41*  41* 39*  CREATININE 3.37* 3.28* 3.26* 3.01*  3.22* 3.07*  CALCIUM 7.7* 7.5* 7.9* 7.8*  7.8* 7.5*  MG 1.9 1.9 2.2 2.0 1.9  PHOS 5.1* 5.2* 5.0* 5.0* 4.6   Liver Function Tests:  Recent Labs Lab 09/01/2016 0750  09/10/16 1634 09/10/16 2030 09/11/16 0034 09/11/16 0458 09/11/16 0822  AST 102*  --   --   --   --  40  --   ALT 60  --   --   --   --  23  --   ALKPHOS 93  --   --   --   --  72  --   BILITOT 1.4*  --   --   --   --  0.7  --   PROT 7.0  --   --   --   --  6.9  --   ALBUMIN 3.1*  < >  2.8* 2.8* 2.8* 2.8*  2.8* 2.5*  < > = values in this interval not displayed. CBC:  Recent Labs Lab 09/10/2016 0750 09/08/16 0152 09/09/16 0431 09/11/16 0458  WBC 15.4* 8.2 12.1* 12.8*  NEUTROABS 12.6*  --   --   --   HGB 13.2 13.3 12.8* 11.5*  HCT 40.7 41.3 39.8* 36.4*  MCV 80.8 80.5 80.1 81.3  PLT 257 178 185 202   Cardiac Enzymes:  Recent Labs Lab 09/21/2016 0750 09/24/2016 1208 09/15/2016 1632 09/08/16 0152 09/08/16 0532  TROPONINI 0.08* 0.44* 0.47* 0.21* 0.15*   BNP (last 3 results)  Recent Labs  09/16/2016 0750  BNP 508.0*     CBG:  Recent Labs Lab 09/10/16 2015 09/10/16 2340 09/11/16 0324 09/11/16 0746 09/11/16 1153  GLUCAP 127* 154* 123* 107* 101*    Recent Results (from the past 240 hour(s))  Culture, blood (routine x 2)     Status: None (Preliminary result)   Collection Time: 09/13/2016  8:18 AM  Result Value Ref Range Status   Specimen Description BLOOD L AC  Final   Special Requests   Final    BOTTLES DRAWN AEROBIC AND ANAEROBIC Blood Culture adequate volume   Culture NO GROWTH 4 DAYS  Final   Report Status PENDING  Incomplete  MRSA PCR  Screening     Status: None   Collection Time: 09/21/2016  9:46 AM  Result Value Ref Range Status   MRSA by PCR NEGATIVE NEGATIVE Final    Comment:        The GeneXpert MRSA Assay (FDA approved for NASAL specimens only), is one component of a comprehensive MRSA colonization surveillance program. It is not intended to diagnose MRSA infection nor to guide or monitor treatment for MRSA infections.   Culture, blood (routine x 2)     Status: None (Preliminary result)   Collection Time: 09/08/2016 11:45 AM  Result Value Ref Range Status   Specimen Description BLOOD L HAND  Final   Special Requests   Final    BOTTLES DRAWN AEROBIC ONLY Blood Culture results may not be optimal due to an inadequate volume of blood received in culture bottles   Culture NO GROWTH 4 DAYS  Final   Report Status PENDING  Incomplete  Culture, respiratory (NON-Expectorated)     Status: None   Collection Time: 09/08/2016  6:32 PM  Result Value Ref Range Status   Specimen Description TRACHEAL ASPIRATE  Final   Special Requests NONE  Final   Gram Stain   Final    FEW WBC PRESENT, PREDOMINANTLY PMN MODERATE GRAM POSITIVE COCCI IN CHAINS IN PAIRS MODERATE GRAM NEGATIVE RODS    Culture   Final    Consistent with normal respiratory flora. Performed at [facility], [place] [address] [place], [place] [address]    Report Status 09/10/2016 FINAL  Final     Studies: Dg Chest 1 View  Result Date: 09/11/2016 CLINICAL DATA:  Dyspnea, recent cardiac arrest, anoxic ischemic encephalopathy, end-stage CHF. EXAM: CHEST 1 VIEW COMPARISON:  Portable chest x-ray of September 09, 2016 FINDINGS: The lungs are mildly hypoinflated. The cardiac silhouette remains enlarged. The pulmonary vascularity is mildly engorged but less conspicuous today. The interstitial markings remain increased. The endotracheal tube tip lies 2.9 cm above the carina. The esophagogastric tube tip projects below the inferior margin of the image. The right internal  jugular venous catheter tip projects at the cavoatrial junction or in the right atrium. A left internal jugular venous catheter has  its tip terminating at the junction of the left internal jugular vein with the left subclavian vein. IMPRESSION: CHF with mild interstitial edema. Bilateral hypoinflation. No pneumonia nor pleural effusion. The support tubes are in reasonable position. Electronically Signed   By: David  Martinique M.D.   On: 09/11/2016 07:13   Ct Head Wo Contrast  Result Date: 09/09/2016 CLINICAL DATA:  Anoxic encephalopathy. EXAM: CT HEAD WITHOUT CONTRAST TECHNIQUE: Contiguous axial images were obtained from the base of the skull through the vertex without intravenous contrast. COMPARISON:  CT scan of September 07, 2016. FINDINGS: Brain: No midline shift is noted. There is loss of gray-white matter differentiation as well as sulcal effacement involving the cerebral cortices bilaterally consistent with diffuse cerebral edema, most likely due to anoxic injury. No hemorrhage is noted. Vascular: No hyperdense vessel or unexpected calcification. Skull: Normal. Negative for fracture or focal lesion. Sinuses/Orbits: Multiple small bilateral maxillary mucous retention cysts are noted. Other: None. IMPRESSION: There has been interval loss of gray-white matter differentiation as well as sulcal effacement involving the cerebral cortices bilaterally consistent with diffuse cerebral edema, most likely due to anoxic injury. Critical Value/emergent results were called by telephone at the time of interpretation on 09/09/2016 at 1:45 pm to [place], the patient's nurse, who verbally acknowledged these results and will contact the ordering physician. Electronically Signed   By: Marijo Conception, M.D.   On: 09/09/2016 13:46    Scheduled Meds: . budesonide (PULMICORT) nebulizer solution  0.5 mg Nebulization BID  . chlorhexidine gluconate (MEDLINE KIT)  15 mL Mouth Rinse BID  . diltiazem  60 mg Oral Q8H  . docusate   100 mg Oral BID  . feeding supplement (VITAL HIGH PROTEIN)  1,000 mL Per Tube Q24H  . insulin aspart  2-6 Units Subcutaneous Q4H  . insulin glargine  10 Units Subcutaneous Q24H  . ipratropium-albuterol  3 mL Nebulization Q6H  . mouth rinse  15 mL Mouth Rinse 10 times per day  . pantoprazole sodium  40 mg Per Tube Daily  . sennosides  5 mL Oral BID  . sodium chloride flush  10-40 mL Intracatheter Q12H   Continuous Infusions: . sodium chloride 10 mL/hr at 09/11/16 1000  . amiodarone 30 mg/hr (09/11/16 1000)  . dexmedetomidine (PRECEDEX) IV infusion 0.6 mcg/kg/hr (09/11/16 1220)  . fentaNYL infusion INTRAVENOUS 190 mcg/hr (09/11/16 1243)  . heparin 1,600 Units/hr (09/11/16 1000)  . milrinone 0.25 mcg/kg/min (09/11/16 1024)  . norepinephrine (LEVOPHED) Adult infusion 25 mcg/min (09/11/16 1246)  . propofol (DIPRIVAN) infusion Stopped (09/11/16 0943)  . pureflow 2,000 mL/hr at 09/11/16 0431  . valproate sodium      Assessment/Plan:  1. Cardiac arrest With multiorgan failure. My suspicion this is arrhythmia related with an EF of 10% seen on echocardiogram.  Overall prognosis poor with anoxic encephalopathy. 2. 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 On milrinone.  With EF of 10% his overall prognosis is poor.  3. Acute kidney injury secondary to cardiac arrest, CT scan with contrast. Continue his dialysis held this afternoon secondary to patient being on 2 pressors. 4. Anoxic encephalopathy with brain edema. Acute encephalopathy. Patient currently on sedation with fentanyl and versed. Previous EEG showing no seizures. Patient started on valproate sodium.  Neurology follow up.  Agree with palliative care consultation 5. Acute hypoxic respiratory failure. Patient has been on 100% FiO2 on the ventilator for the past 2 days. 6. Hypotension- off Levophed and Neo-Synephrine 7. Type 2 diabetes  mellitus. On sliding scale and low-dose Levemir. 8. Rapid  Atrial fibrillation. Continue heparin drip. Patient on amiodarone drip and Cardizem 9. Morbid obesity and likely sleep apnea.  Code Status:     Code Status Orders        Start     Ordered   09/05/2016 1005  Full code  Continuous     09/11/2016 1007    Code Status History    Date Active Date Inactive Code Status Order ID Comments User Context   09/16/2016  8:30 AM 09/06/2016 10:08 AM Full Code [id number]  Loletha Grayer, MD ED     Family Communication: As per critical care specialist. Family at the bedside when I rounded and I answered questions. Support given for brain, heart, blood pressure, respiratory and renal organ systems. Overall prognosis is poor. Disposition Plan: To be determined based on clinical course. Overall prognosis is poor. High likelihood another cardiac arrest  Consultants:  Critical care specialist  Cardiology  Neurology  Nephrology  Palliative care consultation  Time spent: 26 minutes  [place], [facility]

## 2016-09-12 ENCOUNTER — Inpatient Hospital Stay: Payer: BLUE CROSS/BLUE SHIELD

## 2016-09-12 DIAGNOSIS — I5084 End stage heart failure: Secondary | ICD-10-CM

## 2016-09-12 LAB — RENAL FUNCTION PANEL
ALBUMIN: 2.5 g/dL — AB (ref 3.5–5.0)
ANION GAP: 9 (ref 5–15)
ANION GAP: 7 (ref 5–15)
ANION GAP: 6 (ref 5–15)
ANION GAP: 10 (ref 5–15)
Albumin: 2.1 g/dL — ABNORMAL LOW (ref 3.5–5.0)
Albumin: 2.4 g/dL — ABNORMAL LOW (ref 3.5–5.0)
Albumin: 2 g/dL — ABNORMAL LOW (ref 3.5–5.0)
Albumin: 1.7 g/dL — ABNORMAL LOW (ref 3.5–5.0)
Albumin: 2.3 g/dL — ABNORMAL LOW (ref 3.5–5.0)
Anion gap: 6 (ref 5–15)
Anion gap: 9 (ref 5–15)
BUN: 34 mg/dL — AB (ref 6–20)
BUN: 37 mg/dL — ABNORMAL HIGH (ref 6–20)
BUN: 42 mg/dL — AB (ref 6–20)
BUN: 42 mg/dL — ABNORMAL HIGH (ref 6–20)
BUN: 44 mg/dL — AB (ref 6–20)
BUN: 44 mg/dL — ABNORMAL HIGH (ref 6–20)
CALCIUM: 6.9 mg/dL — AB (ref 8.9–10.3)
CALCIUM: 8 mg/dL — AB (ref 8.9–10.3)
CALCIUM: 8.3 mg/dL — AB (ref 8.9–10.3)
CHLORIDE: 92 mmol/L — AB (ref 101–111)
CHLORIDE: 96 mmol/L — AB (ref 101–111)
CHLORIDE: 98 mmol/L — AB (ref 101–111)
CHLORIDE: 98 mmol/L — AB (ref 101–111)
CHLORIDE: 99 mmol/L — AB (ref 101–111)
CO2: 21 mmol/L — AB (ref 22–32)
CO2: 22 mmol/L (ref 22–32)
CO2: 23 mmol/L (ref 22–32)
CO2: 24 mmol/L (ref 22–32)
CO2: 25 mmol/L (ref 22–32)
CO2: 26 mmol/L (ref 22–32)
CREATININE: 3.09 mg/dL — AB (ref 0.61–1.24)
CREATININE: 3.14 mg/dL — AB (ref 0.61–1.24)
CREATININE: 3.28 mg/dL — AB (ref 0.61–1.24)
Calcium: 6.9 mg/dL — ABNORMAL LOW (ref 8.9–10.3)
Calcium: 6.4 mg/dL — CL (ref 8.9–10.3)
Calcium: 8.1 mg/dL — ABNORMAL LOW (ref 8.9–10.3)
Chloride: 100 mmol/L — ABNORMAL LOW (ref 101–111)
Creatinine, Ser: 3.29 mg/dL — ABNORMAL HIGH (ref 0.61–1.24)
Creatinine, Ser: 2.7 mg/dL — ABNORMAL HIGH (ref 0.61–1.24)
Creatinine, Ser: 2.27 mg/dL — ABNORMAL HIGH (ref 0.61–1.24)
GFR calc Af Amer: 24 mL/min — ABNORMAL LOW (ref >60)
GFR calc Af Amer: 25 mL/min — ABNORMAL LOW (ref >60)
GFR calc Af Amer: 30 mL/min — ABNORMAL LOW (ref >60)
GFR calc Af Amer: 38 mL/min — ABNORMAL LOW (ref >60)
GFR calc non Af Amer: 21 mL/min — ABNORMAL LOW (ref >60)
GFR calc non Af Amer: 21 mL/min — ABNORMAL LOW (ref >60)
GFR calc non Af Amer: 22 mL/min — ABNORMAL LOW (ref >60)
GFR calc non Af Amer: 26 mL/min — ABNORMAL LOW (ref >60)
GFR calc non Af Amer: 32 mL/min — ABNORMAL LOW (ref >60)
GFR, EST AFRICAN AMERICAN: 24 mL/min — AB (ref >60)
GFR, EST AFRICAN AMERICAN: 26 mL/min — AB (ref >60)
GFR, EST NON AFRICAN AMERICAN: 22 mL/min — AB (ref >60)
GLUCOSE: 114 mg/dL — AB (ref 65–99)
GLUCOSE: 325 mg/dL — AB (ref 65–99)
GLUCOSE: 368 mg/dL — AB (ref 65–99)
GLUCOSE: 384 mg/dL — AB (ref 65–99)
Glucose, Bld: 113 mg/dL — ABNORMAL HIGH (ref 65–99)
Glucose, Bld: 113 mg/dL — ABNORMAL HIGH (ref 65–99)
PHOSPHORUS: 3.5 mg/dL (ref 2.5–4.6)
PHOSPHORUS: 2.8 mg/dL (ref 2.5–4.6)
POTASSIUM: 3.7 mmol/L (ref 3.5–5.1)
POTASSIUM: 3.2 mmol/L — AB (ref 3.5–5.1)
POTASSIUM: 2.9 mmol/L — AB (ref 3.5–5.1)
Phosphorus: 2.8 mg/dL (ref 2.5–4.6)
Phosphorus: 3.5 mg/dL (ref 2.5–4.6)
Phosphorus: 2.7 mg/dL (ref 2.5–4.6)
Phosphorus: 3.6 mg/dL (ref 2.5–4.6)
Potassium: 2.9 mmol/L — ABNORMAL LOW (ref 3.5–5.1)
Potassium: 3.7 mmol/L (ref 3.5–5.1)
Potassium: 4 mmol/L (ref 3.5–5.1)
SODIUM: 125 mmol/L — AB (ref 135–145)
SODIUM: 132 mmol/L — AB (ref 135–145)
SODIUM: 135 mmol/L (ref 135–145)
Sodium: 121 mmol/L — ABNORMAL LOW (ref 135–145)
Sodium: 125 mmol/L — ABNORMAL LOW (ref 135–145)
Sodium: 133 mmol/L — ABNORMAL LOW (ref 135–145)

## 2016-09-12 LAB — GLUCOSE, CAPILLARY
GLUCOSE-CAPILLARY: 104 mg/dL — AB (ref 65–99)
GLUCOSE-CAPILLARY: 117 mg/dL — AB (ref 65–99)
GLUCOSE-CAPILLARY: 149 mg/dL — AB (ref 65–99)
GLUCOSE-CAPILLARY: 284 mg/dL — AB (ref 65–99)
Glucose-Capillary: 106 mg/dL — ABNORMAL HIGH (ref 65–99)
Glucose-Capillary: 104 mg/dL — ABNORMAL HIGH (ref 65–99)
Glucose-Capillary: 108 mg/dL — ABNORMAL HIGH (ref 65–99)

## 2016-09-12 LAB — CBC
HEMATOCRIT: 31 % — AB (ref 40.0–52.0)
HEMOGLOBIN: 9.8 g/dL — AB (ref 13.0–18.0)
MCH: 25.5 pg — AB (ref 26.0–34.0)
MCHC: 31.6 g/dL — AB (ref 32.0–36.0)
MCV: 80.8 fL (ref 80.0–100.0)
Platelets: 125 10*3/uL — ABNORMAL LOW (ref 150–440)
RBC: 3.83 MIL/uL — ABNORMAL LOW (ref 4.40–5.90)
RDW: 17.2 % — AB (ref 11.5–14.5)
WBC: 12.4 10*3/uL — ABNORMAL HIGH (ref 3.8–10.6)

## 2016-09-12 LAB — COMPREHENSIVE METABOLIC PANEL
ALK PHOS: 53 U/L (ref 38–126)
ALT: 15 U/L — ABNORMAL LOW (ref 17–63)
ANION GAP: 7 (ref 5–15)
AST: 28 U/L (ref 15–41)
Albumin: 2 g/dL — ABNORMAL LOW (ref 3.5–5.0)
BILIRUBIN TOTAL: 0.8 mg/dL (ref 0.3–1.2)
BUN: 40 mg/dL — ABNORMAL HIGH (ref 6–20)
CALCIUM: 6.6 mg/dL — AB (ref 8.9–10.3)
CO2: 22 mmol/L (ref 22–32)
Chloride: 95 mmol/L — ABNORMAL LOW (ref 101–111)
Creatinine, Ser: 2.95 mg/dL — ABNORMAL HIGH (ref 0.61–1.24)
GFR, EST AFRICAN AMERICAN: 27 mL/min — AB (ref >60)
GFR, EST NON AFRICAN AMERICAN: 24 mL/min — AB (ref >60)
GLUCOSE: 428 mg/dL — AB (ref 65–99)
POTASSIUM: 2.8 mmol/L — AB (ref 3.5–5.1)
Sodium: 124 mmol/L — ABNORMAL LOW (ref 135–145)
TOTAL PROTEIN: 5.2 g/dL — AB (ref 6.5–8.1)

## 2016-09-12 LAB — BLOOD GAS, ARTERIAL
Acid-base deficit: 0.8 mmol/L (ref 0.0–2.0)
Bicarbonate: 26.8 mmol/L (ref 20.0–28.0)
FIO2: 1
MECHANICAL RATE: 24
O2 SAT: 94.5 %
PATIENT TEMPERATURE: 37
PCO2 ART: 57 mmHg — AB (ref 32.0–48.0)
PEEP/CPAP: 12 cmH2O
VT: 500 mL
pH, Arterial: 7.28 — ABNORMAL LOW (ref 7.350–7.450)
pO2, Arterial: 82 mmHg — ABNORMAL LOW (ref 83.0–108.0)

## 2016-09-12 LAB — PHOSPHORUS: PHOSPHORUS: 2.8 mg/dL (ref 2.5–4.6)

## 2016-09-12 LAB — HEPARIN LEVEL (UNFRACTIONATED)
HEPARIN UNFRACTIONATED: 0.24 [IU]/mL — AB (ref 0.30–0.70)
Heparin Unfractionated: 0.34 IU/mL (ref 0.30–0.70)

## 2016-09-12 LAB — MAGNESIUM
MAGNESIUM: 1.4 mg/dL — AB (ref 1.7–2.4)
MAGNESIUM: 1.9 mg/dL (ref 1.7–2.4)
MAGNESIUM: 11 mg/dL — AB (ref 1.7–2.4)
Magnesium: 1.6 mg/dL — ABNORMAL LOW (ref 1.7–2.4)
Magnesium: 1.9 mg/dL (ref 1.7–2.4)
Magnesium: 13.5 mg/dL (ref 1.7–2.4)
Magnesium: 2.2 mg/dL (ref 1.7–2.4)

## 2016-09-12 LAB — VALPROIC ACID LEVEL: Valproic Acid Lvl: 71 ug/mL (ref 50.0–100.0)

## 2016-09-12 MED ORDER — MAGNESIUM SULFATE IN D5W 1-5 GM/100ML-% IV SOLN
1.0000 g | Freq: Once | INTRAVENOUS | Status: AC
  Administered 2016-09-12: 1 g via INTRAVENOUS
  Filled 2016-09-12: qty 100

## 2016-09-12 MED ORDER — HEPARIN (PORCINE) IN NACL 100-0.45 UNIT/ML-% IJ SOLN
1850.0000 [IU]/h | INTRAMUSCULAR | Status: DC
  Administered 2016-09-12 – 2016-09-14 (×4): 1850 [IU]/h via INTRAVENOUS
  Filled 2016-09-12 (×4): qty 250

## 2016-09-12 MED ORDER — PUREFLOW DIALYSIS SOLUTION
INTRAVENOUS | Status: DC
  Administered 2016-09-12 (×2): 3 via INTRAVENOUS_CENTRAL
  Administered 2016-09-12: 03:00:00 via INTRAVENOUS_CENTRAL
  Administered 2016-09-13 (×2): 3 via INTRAVENOUS_CENTRAL
  Administered 2016-09-13: 2000 mL via INTRAVENOUS_CENTRAL
  Administered 2016-09-15: 01:00:00 via INTRAVENOUS_CENTRAL
  Administered 2016-09-15 (×2): 15000 mL via INTRAVENOUS_CENTRAL
  Administered 2016-09-16: 01:00:00 via INTRAVENOUS_CENTRAL
  Administered 2016-09-16 (×2): 15000 mL via INTRAVENOUS_CENTRAL
  Administered 2016-09-17 – 2016-09-18 (×3): via INTRAVENOUS_CENTRAL

## 2016-09-12 MED ORDER — MAGNESIUM SULFATE 2 GM/50ML IV SOLN
2.0000 g | Freq: Once | INTRAVENOUS | Status: AC
  Administered 2016-09-12: 2 g via INTRAVENOUS
  Filled 2016-09-12: qty 50

## 2016-09-12 MED ORDER — AMIODARONE LOAD VIA INFUSION
150.0000 mg | Freq: Once | INTRAVENOUS | Status: AC
  Administered 2016-09-12: 150 mg via INTRAVENOUS
  Filled 2016-09-12: qty 83.34

## 2016-09-12 MED ORDER — POTASSIUM CHLORIDE 10 MEQ/100ML IV SOLN
10.0000 meq | INTRAVENOUS | Status: DC
  Filled 2016-09-12 (×4): qty 100

## 2016-09-12 MED ORDER — HEPARIN BOLUS VIA INFUSION
1900.0000 [IU] | Freq: Once | INTRAVENOUS | Status: AC
  Administered 2016-09-12: 1900 [IU] via INTRAVENOUS
  Filled 2016-09-12: qty 1900

## 2016-09-12 MED ORDER — AMIODARONE IV BOLUS ONLY 150 MG/100ML: 150.0000 mg | Freq: Once | INTRAVENOUS | Status: DC

## 2016-09-12 NOTE — Progress Notes (Signed)
Subjective: Patient unchanged.  Remains difficult to get off sedation due to the patient becoming tachypneic and asynchronous with the ventilator.  Remains unresponsive.    Objective: Current vital signs: BP (!) 96/53   Pulse (!) 133   Temp 99.7 F (37.6 C)   Resp (!) 34   Ht 6' (1.829 m)   Wt (!) 216.4 kg (477 lb)   SpO2 96%   BMI 64.69 kg/m  Vital signs in last 24 hours: Temp:  [96.6 F (35.9 C)-99.7 F (37.6 C)] 99.7 F (37.6 C) (06/16 1515) Pulse Rate:  [43-163] 133 (06/16 1515) Resp:  [0-44] 34 (06/16 1515) BP: (75-123)/(39-82) 96/53 (06/16 1515) SpO2:  [92 %-100 %] 96 % (06/16 1515) FiO2 (%):  [92 %-100 %] 100 % (06/16 1300) Weight:  [216.4 kg (477 lb)] 216.4 kg (477 lb) (06/16 0300)  Intake/Output from previous day: 06/15 0701 - 06/16 0700 In: 4400.4 [I.V.:2959.4; NG/GT:1266; IV Piggyback:175] Out: 400 [Urine:400] Intake/Output this shift: Total I/O In: 1164.4 [I.V.:994.4; NG/GT:60; IV Piggyback:110] Out: 265 [Urine:265] Nutritional status: Diet NPO time specified  Neurologic Exam: Mental Status: Patient does not respond to verbal stimuli. Does not respond to deep sternal rub. Does not follow commands. No verbalizations noted.  Cranial Nerves: II: patient does not respond confrontation bilaterally, pupils right 47m, left 352mand reactivebilaterally III,IV,VI: doll's response absent bilaterally.  V,VII: corneal reflex absent bilaterally VIII: patient does not respond to verbal stimuli IX,X: gag reflex reduced, XI: trapezius strength unable to test bilaterally XII: tongue strength unable to test Motor: Extremities flaccid throughout. No spontaneous movement noted. No purposeful movements noted. Sensory: Does not respond to noxious stimuli in any extremity. Deep Tendon Reflexes:  1+ throughout Plantars: Mute bilaterally Cerebellar: Unable to perform  Lab Results: Basic Metabolic Panel:  Recent Labs Lab 09/11/16 2040 09/12/16 0039  09/12/16 0359 09/12/16 0904 09/12/16 1201  NA 133* 125* 124* 135 132*  K 3.8 2.9* 2.8* 3.7 3.7  CL 99* 96* 95* 100* 98*  CO2 _0 GLUCOSE 137* 368* 428* 113* 114*  BUN 48* 42* 40* 44* 44*  CREATININE 3.64* 3.28* 2.95* 3.29* 3.14*  CALCIUM 8.2* 6.9* 6.6* 8.3* 8.0*  MG 1.9 1.6* 1.4* 1.9 1.9  PHOS 3.5 2.8 2.8 3.5 3.5    Liver Function Tests:  Recent Labs Lab 08/31/2016 0750  09/11/16 0458  09/11/16 2040 09/12/16 0039 09/12/16 0359 09/12/16 0904 09/12/16 1201  AST 102*  --  40  --   --   --  28  --   --   ALT 60  --  23  --   --   --  15*  --   --   ALKPHOS 93  --  72  --   --   --  53  --   --   BILITOT 1.4*  --  0.7  --   --   --  0.8  --   --   PROT 7.0  --  6.9  --   --   --  5.2*  --   --   ALBUMIN 3.1*  < > 2.8*  2.8*  < > 2.6* 2.1* 2.0* 2.5* 2.4*  < > = values in this interval not displayed. No results for input(s): LIPASE, AMYLASE in the last 168 hours. No results for input(s): AMMONIA in the last 168 hours.  CBC:  Recent Labs Lab 09/06/2016 0750 09/08/16 0152 09/09/16 0431 09/11/16 0458 09/12/16 0359  WBC 15.4* 8.2 12.1* 12.8* 12.4*  NEUTROABS 12.6*  --   --   --   --   HGB 13.2 13.3 12.8* 11.5* 9.8*  HCT 40.7 41.3 39.8* 36.4* 31.0*  MCV 80.8 80.5 80.1 81.3 80.8  PLT 257 178 185 202 125*    Cardiac Enzymes:  Recent Labs Lab 08/29/2016 0750 09/06/2016 1208 09/24/2016 1632 09/08/16 0152 09/08/16 0532  TROPONINI 0.08* 0.44* 0.47* 0.21* 0.15*    Lipid Panel:  Recent Labs Lab 09/10/16 1258  TRIG 37    CBG:  Recent Labs Lab 09/11/16 2004 09/12/16 0026 09/12/16 0417 09/12/16 0716 09/12/16 1150  GLUCAP 140* 149* 106* 104* 104*    Microbiology: Results for orders placed or performed during the hospital encounter of 08/31/2016  Culture, blood (routine x 2)     Status: None (Preliminary result)   Collection Time: 09/04/2016  8:18 AM  Result Value Ref Range Status   Specimen Description BLOOD L AC  Final   Special Requests   Final     BOTTLES DRAWN AEROBIC AND ANAEROBIC Blood Culture adequate volume   Culture NO GROWTH 4 DAYS  Final   Report Status PENDING  Incomplete  MRSA PCR Screening     Status: None   Collection Time: 09/21/2016  9:46 AM  Result Value Ref Range Status   MRSA by PCR NEGATIVE NEGATIVE Final    Comment:        The GeneXpert MRSA Assay (FDA approved for NASAL specimens only), is one component of a comprehensive MRSA colonization surveillance program. It is not intended to diagnose MRSA infection nor to guide or monitor treatment for MRSA infections.   Culture, blood (routine x 2)     Status: None (Preliminary result)   Collection Time: 09/06/2016 11:45 AM  Result Value Ref Range Status   Specimen Description BLOOD L HAND  Final   Special Requests   Final    BOTTLES DRAWN AEROBIC ONLY Blood Culture results may not be optimal due to an inadequate volume of blood received in culture bottles   Culture NO GROWTH 4 DAYS  Final   Report Status PENDING  Incomplete  Culture, respiratory (NON-Expectorated)     Status: None   Collection Time: 08/29/2016  6:32 PM  Result Value Ref Range Status   Specimen Description TRACHEAL ASPIRATE  Final   Special Requests NONE  Final   Gram Stain   Final    FEW WBC PRESENT, PREDOMINANTLY PMN MODERATE GRAM POSITIVE COCCI IN CHAINS IN PAIRS MODERATE GRAM NEGATIVE RODS    Culture   Final    Consistent with normal respiratory flora. Performed at [facility], [facility] [address] [place], [place] [address]    Report Status 09/10/2016 FINAL  Final    Coagulation Studies: No results for input(s): LABPROT, INR in the last 72 hours.  Imaging: Dg Chest 1 View  Result Date: 09/12/2016 CLINICAL DATA:  Dyspnea EXAM: CHEST 1 VIEW COMPARISON:  09/11/2016 FINDINGS: Endotracheal tube remains in good position. Right jugular central venous catheter tip in the right atrium unchanged. Cardiac enlargement. Extensive diffuse bilateral airspace disease unchanged. Negative for  pleural effusion IMPRESSION: Diffuse bilateral airspace disease unchanged. Endotracheal tube remains in good position. Electronically Signed   By: Franchot Gallo M.D.   On: 09/12/2016 07:18   Dg Chest 1 View  Result Date: 09/11/2016 CLINICAL DATA:  Dyspnea, recent cardiac arrest, anoxic ischemic encephalopathy, end-stage CHF. EXAM: CHEST 1 VIEW COMPARISON:  Portable chest x-ray of September 09, 2016 FINDINGS: The lungs are mildly hypoinflated. The  cardiac silhouette remains enlarged. The pulmonary vascularity is mildly engorged but less conspicuous today. The interstitial markings remain increased. The endotracheal tube tip lies 2.9 cm above the carina. The esophagogastric tube tip projects below the inferior margin of the image. The right internal jugular venous catheter tip projects at the cavoatrial junction or in the right atrium. A left internal jugular venous catheter has its tip terminating at the junction of the left internal jugular vein with the left subclavian vein. IMPRESSION: CHF with mild interstitial edema. Bilateral hypoinflation. No pneumonia nor pleural effusion. The support tubes are in reasonable position. Electronically Signed   By: David  Martinique M.D.   On: 09/11/2016 07:13   Dg Chest Port 1 View  Addendum Date: 09/12/2016   ADDENDUM REPORT: 09/12/2016 02:43 ADDENDUM: The patient's left PICC is noted ending about the distal SVC. The right IJ line is grossly unchanged in appearance, though not fully characterized. These results were called by telephone at the time of interpretation on 09/12/2016 at 2:42 am to [facility] at the [place], who verbally acknowledged these results. Electronically Signed   By: Garald Balding M.D.   On: 09/12/2016 02:43   Result Date: 09/12/2016 CLINICAL DATA:  Line placement. EXAM: PORTABLE CHEST 1 VIEW COMPARISON:  09/11/2016 FINDINGS: Right internal jugular approach central venous catheter tip is obscured by overlying soft tissues. Enteric catheter  collimated off the image. Endotracheal tube in stable position. Left internal jugular approach venous catheter terminates at the expected location of the junction of the left internal jugular vein and left subclavian vein. Enlarged cardiac silhouette.  Mediastinal contours appear intact. Low lung volumes with interstitial pulmonary edema. No evidence of pneumothorax. Osseous structures are without acute abnormality. Soft tissues are grossly normal. IMPRESSION: Right internal jugular approach central venous catheter tip not visualized due to overlying soft tissues. The remaining of the supporting lines and tubes in stable position. Enlarged cardiac silhouette and pulmonary edema. Low lung volumes. Electronically Signed: By: Fidela Salisbury M.D. On: 09/11/2016 19:24   Dg Chest Port 1 View  Result Date: 09/11/2016 CLINICAL DATA:  Left PICC placement.  Initial encounter. EXAM: PORTABLE CHEST 1 VIEW COMPARISON:  Chest radiograph performed earlier today at 7:02 p.m. FINDINGS: The patient's left PICC is noted ending about the distal SVC. The endotracheal tube is seen ending 4-5 cm above the carina. A right IJ line is also noted ending about the distal SVC. The lungs are hypoexpanded. Vascular congestion is noted. Bilateral central airspace opacification raises concern for pulmonary edema. No definite pleural effusion or pneumothorax is seen. The cardiomediastinal silhouette is mildly enlarged. No acute osseous abnormalities are seen. IMPRESSION: 1. Left PICC noted ending about the distal SVC. 2. Endotracheal tube seen ending 4-5 cm above the carina. 3. Lungs hypoexpanded. Vascular congestion and mild cardiomegaly. Bilateral central airspace opacification raises concern for pulmonary edema. Electronically Signed   By: Garald Balding M.D.   On: 09/11/2016 22:08    Medications:  I have reviewed the patient's current medications. Scheduled: . budesonide (PULMICORT) nebulizer solution  0.5 mg Nebulization BID  .  chlorhexidine gluconate (MEDLINE KIT)  15 mL Mouth Rinse BID  . diltiazem  60 mg Oral Q8H  . docusate  100 mg Oral BID  . feeding supplement (PRO-STAT SUGAR FREE 64)  30 mL Oral Q4H  . insulin aspart  2-6 Units Subcutaneous Q4H  . insulin glargine  10 Units Subcutaneous Q24H  . ipratropium-albuterol  3 mL Nebulization Q6H  . mouth rinse  15 mL Mouth Rinse 10 times per day  . pantoprazole sodium  40 mg Per Tube Daily  . sennosides  5 mL Oral BID  . sodium chloride flush  10-40 mL Intracatheter Q12H  . sodium chloride flush  10-40 mL Intracatheter Q12H    Assessment/Plan: Patient remains unresponsive  Now >72 hours post warming from hypothermia protocol.  On Fentanyl and Precedex.  Despite this does not fulfill criteria for brain death on exam.  EEG performed on yesterday shows no evidence of cerebral activity.  Unclear how much of this is related to medications.  Patient remains on Depakote with a level of 71.   Family not available for conference today.  Prognosis poor due to multi-organ failure but patient not brain dead.  Unable to perform more extensive imaging due to size.  May benefit from a perfusion study to determine how much blood flow is getting to the cerebral hemispheres which may help with prognostication.   Would continue Depacon at current dose.  Case discussed with Dr. Ashby Dawes.     LOS: 5 days   Alexis Goodell, MD Neurology [phone] 09/12/2016  3:33 PM

## 2016-09-12 NOTE — Progress Notes (Signed)
ANTICOAGULATION CONSULT NOTE   Pharmacy Consult for heparin drip management  Indication: elevated tropinins   Pharmacy consulted for heparin drip management for 51 yo male admitted s/p vfib arrest. Patient had CPR x 3 rounds. Patient intubated and sedated; s/p targeted temperature management and now being initiated on CRRT. Patient takes apixaban as an outpatient currently has elevated anti-Xa level. Patient currently receiving heparin at 1600 units/hr.   6/16 ~ 12:00  HL = 0.24.    Goal:  APTT: 68-109 anti-Xa: 0.3-0.7 Monitor platelets per protocol  Plan:  Will order a bolus of 1900 units and increase the drip rate to 1850 units/hr.   Next HL ordered for 6/16 at 20:00   No Known Allergies  Patient Measurements: Height: 6' (182.9 cm) Weight: (!) 477 lb (216.4 kg) IBW/kg (Calculated) : 77.6 Heparin Dosing Weight: 129kg  Vital Signs: Temp: 98.4 F (36.9 C) (06/16 0945) Temp Source: Core (Comment) (06/16 0600) BP: 92/50 (06/16 0945) Pulse Rate: 123 (06/16 0945)  Labs:  Recent Labs  09/10/16 [id number]  09/10/16 1634  09/11/16 0034 09/11/16 0458  09/11/16 1215  09/11/16 2056  09/12/16 0359 09/12/16 0904 09/12/16 1201  HGB  --   --   --   --   --  11.5*  --   --   --   --   --  9.8*  --   --   HCT  --   --   --   --   --  36.4*  --   --   --   --   --  31.0*  --   --   PLT  --   --   --   --   --  202  --   --   --   --   --  125*  --   --   APTT 59*  --  63*  --  91*  --   --   --   --   --   --   --   --   --   HEPARINUNFRC  --   --   --   --   --  0.58  --  0.45  --  0.47  --   --   --  0.24*  CREATININE 3.47*  < > 3.37*  < > 3.26* 3.01*  3.22*  < > 3.33*  < >  --   < > 2.95* 3.29* 3.14*  < > = values in this interval not displayed.  Estimated Creatinine Clearance: 54.2 mL/min (A) (by C-G formula based on SCr of 3.14 mg/dL (H)).   Pharmacy will continue to monitor and adjust per consult.   Stormy CardKatsoudas,Jaira Canady K, [facility] California Hospital At HollywoodRPH Clinical Pharmacist 09/12/16 1:30  PM

## 2016-09-12 NOTE — Progress Notes (Addendum)
MEDICATION RELATED CONSULT NOTE - INITIAL   Pharmacy Consult for Electrolyte management  No Known Allergies  Patient Measurements: Height: 6' (182.9 cm) Weight: (!) 477 lb (216.4 kg) IBW/kg (Calculated) : 77.6   Vital Signs: Temp: 99.9 F (37.7 C) (06/16 1715) BP: 90/62 (06/16 1700) Pulse Rate: 150 (06/16 1715) Intake/Output from previous day: 06/15 0701 - 06/16 0700 In: 4400.4 [I.V.:2959.4; NG/GT:1266; IV Piggyback:175] Out: 400 [Urine:400] Intake/Output from this shift: No intake/output data recorded.  Labs:  Recent Labs  09/10/16 0922  09/10/16 1634  09/11/16 0034 09/11/16 0458  09/12/16 0359  09/12/16 1201 09/12/16 1611 09/12/16 1753  WBC  --   --   --   --   --  12.8*  --  12.4*  --   --   --   --   HGB  --   --   --   --   --  11.5*  --  9.8*  --   --   --   --   HCT  --   --   --   --   --  36.4*  --  31.0*  --   --   --   --   PLT  --   --   --   --   --  202  --  125*  --   --   --   --   APTT 59*  --  63*  --  91*  --   --   --   --   --   --   --   CREATININE 3.47*  < > 3.37*  < > 3.26* 3.01*  3.22*  < > 2.95*  < > 3.14* 2.70* 2.27*  MG 2.0  < > 1.9  < > 2.2 2.0  < > 1.4*  < > 1.9 11.0* 13.5*  PHOS 5.8*  < > 5.1*  < > 5.0* 5.0*  < > 2.8  < > 3.5 2.8 2.7  ALBUMIN 3.0*  < > 2.8*  < > 2.8* 2.8*  2.8*  < > 2.0*  < > 2.4* 2.0* 1.7*  PROT  --   --   --   --   --  6.9  --  5.2*  --   --   --   --   AST  --   --   --   --   --  40  --  28  --   --   --   --   ALT  --   --   --   --   --  23  --  15*  --   --   --   --   ALKPHOS  --   --   --   --   --  72  --  53  --   --   --   --   BILITOT  --   --   --   --   --  0.7  --  0.8  --   --   --   --   < > = values in this interval not displayed. Estimated Creatinine Clearance: 74.9 mL/min (A) (by C-G formula based on SCr of 2.27 mg/dL (H)).  Medical History: Past Medical History:  Diagnosis Date  . CHF (congestive heart failure) ([place])   . Diabetes mellitus without complication ([place])   . Hypertension      Medications:  Scheduled:  .  budesonide (PULMICORT) nebulizer solution  0.5 mg Nebulization BID  . chlorhexidine gluconate (MEDLINE KIT)  15 mL Mouth Rinse BID  . diltiazem  60 mg Oral Q8H  . docusate  100 mg Oral BID  . feeding supplement (PRO-STAT SUGAR FREE 64)  30 mL Oral Q4H  . insulin aspart  2-6 Units Subcutaneous Q4H  . insulin glargine  10 Units Subcutaneous Q24H  . ipratropium-albuterol  3 mL Nebulization Q6H  . mouth rinse  15 mL Mouth Rinse 10 times per day  . pantoprazole sodium  40 mg Per Tube Daily  . sennosides  5 mL Oral BID  . sodium chloride flush  10-40 mL Intracatheter Q12H  . sodium chloride flush  10-40 mL Intracatheter Q12H   Infusions:  . sodium chloride 10 mL/hr at 09/12/16 1800  . amiodarone 30 mg/hr (09/12/16 1800)  . dexmedetomidine (PRECEDEX) IV infusion 0.7 mcg/kg/hr (09/12/16 1805)  . feeding supplement (VITAL HIGH PROTEIN) Stopped (09/12/16 0800)  . fentaNYL infusion INTRAVENOUS 75 mcg/hr (09/12/16 1800)  . heparin 1,850 Units/hr (09/12/16 1800)  . milrinone 0.25 mcg/kg/min (09/12/16 1800)  . norepinephrine (LEVOPHED) Adult infusion 50 mcg/min (09/12/16 1800)  . potassium chloride    . propofol (DIPRIVAN) infusion Stopped (09/11/16 0943)  . pureflow 3 each (09/12/16 1819)  . valproate sodium 1,000 mg (09/12/16 1737)    K+: 2.9 will order KCL 31mq IV x 4. Follow up with next renal function panel.   BMP is ordered daily. Mg and renal function panel is ordered every 4 hours.     SPernell Dupre RPhiladeLPhia Surgi Center Inc6/16/2018,7:05 PM    6/17 00:15 electrolytes WNL, except Na which is normalizing. Recheck at next interval.  MSim Boast PharmD, BCPS  09/13/16 3:07 AM

## 2016-09-12 NOTE — Progress Notes (Signed)
Patient ID: Isaiah [name], male   DOB: 1965-09-28, 51 y.o.   MRN: [id number]   [facility] PROGRESS NOTE  Isaiah [name] DQQ:[id number] DOB: [birth year] DOA: 09/06/2016 PCP: Patient, No Pcp Per  HPI/Subjective: Unresponsive on the ventilator mother at bedside  Objective: Vitals:   09/12/16 0945 09/12/16 1400  BP: (!) 92/50 (!) 106/47  Pulse: (!) 123   Resp: (!) 34   Temp: 98.4 F (36.9 C)     Filed Weights   09/10/16 0400 09/11/16 0410 09/12/16 0300  Weight: (!) 478 lb (216.8 kg) (!) 483 lb (219.1 kg) (!) 477 lb (216.4 kg)    ROS: Review of Systems  Unable to perform ROS: Critical illness   Exam: Physical Exam  HENT:  Nose: No mucosal edema.  Unable to look into mouth at this time  Eyes: Conjunctivae and lids are normal.  Pupils pinpoint  Neck: Carotid bruit is not present. No thyroid mass and no thyromegaly present.  Cardiovascular: S1 normal and S2 normal.  An irregularly irregular rhythm present. Tachycardia present.  Exam reveals distant heart sounds. Exam reveals no gallop.   No murmur heard. Pulses:      Dorsalis pedis pulses are 2+ on the right side, and 2+ on the left side.  Respiratory: No respiratory distress. He has decreased breath sounds in the right middle field, the right lower field, the left middle field and the left lower field. He has wheezes in the right middle field and the left middle field. He has rhonchi in the right lower field and the left lower field. He has no rales.  GI: Soft. Bowel sounds are normal. There is no tenderness.  Musculoskeletal:       Right knee: He exhibits swelling.       Left knee: He exhibits swelling.       Right ankle: He exhibits swelling.       Left ankle: He exhibits swelling.  Lymphadenopathy:    He has no cervical adenopathy.  Neurological: He is unresponsive.  Unresponsive to painful stimuli  Skin: No rash noted. Nails show no clubbing.  Psychiatric:  Unable to assess at this time secondary to  unresponsive      Data Reviewed: Basic Metabolic Panel:  Recent Labs Lab 09/11/16 2040 09/12/16 0039 09/12/16 0359 09/12/16 0904 09/12/16 1201  NA 133* 125* 124* 135 132*  K 3.8 2.9* 2.8* 3.7 3.7  CL 99* 96* 95* 100* 98*  CO2 26 23 22 26 25   GLUCOSE 137* 368* 428* 113* 114*  BUN 48* 42* 40* 44* 44*  CREATININE 3.64* 3.28* 2.95* 3.29* 3.14*  CALCIUM 8.2* 6.9* 6.6* 8.3* 8.0*  MG 1.9 1.6* 1.4* 1.9 1.9  PHOS 3.5 2.8 2.8 3.5 3.5   Liver Function Tests:  Recent Labs Lab 09/08/2016 0750  09/11/16 0458  09/11/16 2040 09/12/16 0039 09/12/16 0359 09/12/16 0904 09/12/16 1201  AST 102*  --  40  --   --   --  28  --   --   ALT 60  --  23  --   --   --  15*  --   --   ALKPHOS 93  --  72  --   --   --  53  --   --   BILITOT 1.4*  --  0.7  --   --   --  0.8  --   --   PROT 7.0  --  6.9  --   --   --  5.2*  --   --   ALBUMIN 3.1*  < > 2.8*  2.8*  < > 2.6* 2.1* 2.0* 2.5* 2.4*  < > = values in this interval not displayed. CBC:  Recent Labs Lab 09/09/2016 0750 09/08/16 0152 09/09/16 0431 09/11/16 0458 09/12/16 0359  WBC 15.4* 8.2 12.1* 12.8* 12.4*  NEUTROABS 12.6*  --   --   --   --   HGB 13.2 13.3 12.8* 11.5* 9.8*  HCT 40.7 41.3 39.8* 36.4* 31.0*  MCV 80.8 80.5 80.1 81.3 80.8  PLT 257 178 185 202 125*   Cardiac Enzymes:  Recent Labs Lab 09/09/2016 0750 08/29/2016 1208 09/11/2016 1632 09/08/16 0152 09/08/16 0532  TROPONINI 0.08* 0.44* 0.47* 0.21* 0.15*   BNP (last 3 results)  Recent Labs  09/24/2016 0750  BNP 508.0*     CBG:  Recent Labs Lab 09/11/16 2004 09/12/16 0026 09/12/16 0417 09/12/16 0716 09/12/16 1150  GLUCAP 140* 149* 106* 104* 104*    Recent Results (from the past 240 hour(s))  Culture, blood (routine x 2)     Status: None (Preliminary result)   Collection Time: 09/21/2016  8:18 AM  Result Value Ref Range Status   Specimen Description BLOOD L AC  Final   Special Requests   Final    BOTTLES DRAWN AEROBIC AND ANAEROBIC Blood Culture adequate  volume   Culture NO GROWTH 4 DAYS  Final   Report Status PENDING  Incomplete  MRSA PCR Screening     Status: None   Collection Time: 09/15/2016  9:46 AM  Result Value Ref Range Status   MRSA by PCR NEGATIVE NEGATIVE Final    Comment:        The GeneXpert MRSA Assay (FDA approved for NASAL specimens only), is one component of a comprehensive MRSA colonization surveillance program. It is not intended to diagnose MRSA infection nor to guide or monitor treatment for MRSA infections.   Culture, blood (routine x 2)     Status: None (Preliminary result)   Collection Time: 09/02/2016 11:45 AM  Result Value Ref Range Status   Specimen Description BLOOD L HAND  Final   Special Requests   Final    BOTTLES DRAWN AEROBIC ONLY Blood Culture results may not be optimal due to an inadequate volume of blood received in culture bottles   Culture NO GROWTH 4 DAYS  Final   Report Status PENDING  Incomplete  Culture, respiratory (NON-Expectorated)     Status: None   Collection Time: 09/04/2016  6:32 PM  Result Value Ref Range Status   Specimen Description TRACHEAL ASPIRATE  Final   Special Requests NONE  Final   Gram Stain   Final    FEW WBC PRESENT, PREDOMINANTLY PMN MODERATE GRAM POSITIVE COCCI IN CHAINS IN PAIRS MODERATE GRAM NEGATIVE RODS    Culture   Final    Consistent with normal respiratory flora. Performed at [facility], [place] [address] [place],  [address]    Report Status 09/10/2016 FINAL  Final     Studies: Dg Chest 1 View  Result Date: 09/12/2016 CLINICAL DATA:  Dyspnea EXAM: CHEST 1 VIEW COMPARISON:  09/11/2016 FINDINGS: Endotracheal tube remains in good position. Right jugular central venous catheter tip in the right atrium unchanged. Cardiac enlargement. Extensive diffuse bilateral airspace disease unchanged. Negative for pleural effusion IMPRESSION: Diffuse bilateral airspace disease unchanged. Endotracheal tube remains in good position. Electronically Signed   By:  Franchot Gallo M.D.   On: 09/12/2016 07:18   Dg  Chest 1 View  Result Date: 09/11/2016 CLINICAL DATA:  Dyspnea, recent cardiac arrest, anoxic ischemic encephalopathy, end-stage CHF. EXAM: CHEST 1 VIEW COMPARISON:  Portable chest x-ray of September 09, 2016 FINDINGS: The lungs are mildly hypoinflated. The cardiac silhouette remains enlarged. The pulmonary vascularity is mildly engorged but less conspicuous today. The interstitial markings remain increased. The endotracheal tube tip lies 2.9 cm above the carina. The esophagogastric tube tip projects below the inferior margin of the image. The right internal jugular venous catheter tip projects at the cavoatrial junction or in the right atrium. A left internal jugular venous catheter has its tip terminating at the junction of the left internal jugular vein with the left subclavian vein. IMPRESSION: CHF with mild interstitial edema. Bilateral hypoinflation. No pneumonia nor pleural effusion. The support tubes are in reasonable position. Electronically Signed   By: David  Martinique M.D.   On: 09/11/2016 07:13   Dg Chest Port 1 View  Addendum Date: 09/12/2016   ADDENDUM REPORT: 09/12/2016 02:43 ADDENDUM: The patient's left PICC is noted ending about the distal SVC. The right IJ line is grossly unchanged in appearance, though not fully characterized. These results were called by telephone at the time of interpretation on 09/12/2016 at 2:42 am to [facility] [place], who verbally acknowledged these results. Electronically Signed   By: Garald Balding M.D.   On: 09/12/2016 02:43   Result Date: 09/12/2016 CLINICAL DATA:  Line placement. EXAM: PORTABLE CHEST 1 VIEW COMPARISON:  09/11/2016 FINDINGS: Right internal jugular approach central venous catheter tip is obscured by overlying soft tissues. Enteric catheter collimated off the image. Endotracheal tube in stable position. Left internal jugular approach venous catheter terminates at the expected location  of the junction of the left internal jugular vein and left subclavian vein. Enlarged cardiac silhouette.  Mediastinal contours appear intact. Low lung volumes with interstitial pulmonary edema. No evidence of pneumothorax. Osseous structures are without acute abnormality. Soft tissues are grossly normal. IMPRESSION: Right internal jugular approach central venous catheter tip not visualized due to overlying soft tissues. The remaining of the supporting lines and tubes in stable position. Enlarged cardiac silhouette and pulmonary edema. Low lung volumes. Electronically Signed: By: Fidela Salisbury M.D. On: 09/11/2016 19:24   Dg Chest Port 1 View  Result Date: 09/11/2016 CLINICAL DATA:  Left PICC placement.  Initial encounter. EXAM: PORTABLE CHEST 1 VIEW COMPARISON:  Chest radiograph performed earlier today at 7:02 p.m. FINDINGS: The patient's left PICC is noted ending about the distal SVC. The endotracheal tube is seen ending 4-5 cm above the carina. A right IJ line is also noted ending about the distal SVC. The lungs are hypoexpanded. Vascular congestion is noted. Bilateral central airspace opacification raises concern for pulmonary edema. No definite pleural effusion or pneumothorax is seen. The cardiomediastinal silhouette is mildly enlarged. No acute osseous abnormalities are seen. IMPRESSION: 1. Left PICC noted ending about the distal SVC. 2. Endotracheal tube seen ending 4-5 cm above the carina. 3. Lungs hypoexpanded. Vascular congestion and mild cardiomegaly. Bilateral central airspace opacification raises concern for pulmonary edema. Electronically Signed   By: Garald Balding M.D.   On: 09/11/2016 22:08    Scheduled Meds: . budesonide (PULMICORT) nebulizer solution  0.5 mg Nebulization BID  . chlorhexidine gluconate (MEDLINE KIT)  15 mL Mouth Rinse BID  . diltiazem  60 mg Oral Q8H  . docusate  100 mg Oral BID  . feeding supplement (PRO-STAT SUGAR FREE 64)  30 mL Oral Q4H  .  insulin aspart  2-6  Units Subcutaneous Q4H  . insulin glargine  10 Units Subcutaneous Q24H  . ipratropium-albuterol  3 mL Nebulization Q6H  . mouth rinse  15 mL Mouth Rinse 10 times per day  . pantoprazole sodium  40 mg Per Tube Daily  . sennosides  5 mL Oral BID  . sodium chloride flush  10-40 mL Intracatheter Q12H  . sodium chloride flush  10-40 mL Intracatheter Q12H   Continuous Infusions: . sodium chloride Stopped (09/11/16 2000)  . amiodarone 30 mg/hr (09/12/16 0830)  . dexmedetomidine (PRECEDEX) IV infusion 0.7 mcg/kg/hr (09/12/16 1332)  . feeding supplement (VITAL HIGH PROTEIN) 1,000 mL (09/12/16 0600)  . fentaNYL infusion INTRAVENOUS 75 mcg/hr (09/12/16 1057)  . heparin 1,850 Units/hr (09/12/16 1354)  . milrinone 0.25 mcg/kg/min (09/12/16 0858)  . norepinephrine (LEVOPHED) Adult infusion 50 mcg/min (09/12/16 1056)  . propofol (DIPRIVAN) infusion Stopped (09/11/16 0943)  . pureflow 3 each (09/12/16 1057)  . valproate sodium Stopped (09/12/16 1056)    Assessment/Plan:  1. Cardiac arrest With multiorgan failure. Suspect due to arrhythmia related with an EF of 10% seen on echocardiogram.  Overall prognosis poor with anoxic encephalopathy. I discussed with the mother 2. 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 On milrinone.  With EF of 10% his overall prognosis is poor.  3. Acute kidney injury secondary to cardiac arrest, CT scan with contrast. Continue therapy per neurology 4. Anoxic encephalopathy with brain edema. Acute encephalopathy. Patient currently on sedation with fentanyl and versed. Previous EEG showing no seizures. Patient started on valproate sodium.  Neurology follow up.  Palliative care team following prognosis poor 5. Acute hypoxic respiratory failure. Patient has been on 100% FiO2 on the ventilator for the past 2 days. 6. Hypotension- Continue Levophed  7. Type 2 diabetes mellitus. On sliding scale and low-dose Levemir. 8. Rapid Atrial  fibrillation. Continue heparin drip. Patient on amiodarone drip and Cardizem 9. Morbid obesity and likely sleep apnea.  Code Status:     Code Status Orders        Start     Ordered   09/01/2016 1005  Full code  Continuous     09/13/2016 1007    Code Status History    Date Active Date Inactive Code Status Order ID Comments User Context   09/16/2016  8:30 AM 09/20/2016 10:08 AM Full Code [id number]  Loletha Grayer, MD ED     Family Communication: As per critical care specialist. Family at the bedside when I rounded and I answered questions. Support given for brain, heart, blood pressure, respiratory and renal organ systems. Overall prognosis is poor. Disposition Plan: To be determined based on clinical course. Overall prognosis is poor. High likelihood another cardiac arrest  Consultants:  Critical care specialist  Cardiology  Neurology  Nephrology  Palliative care consultation  Time spent: 26 minutes  [place], [facility]

## 2016-09-12 NOTE — Progress Notes (Signed)
Chaplain received request for spiritual presence and pastoral support from family members of patient in room IC1. Provided such along with silent prayer.     09/12/16 1745  Clinical Encounter Type  Visited With Patient and family together  Visit Type Follow-up;Spiritual support  Referral From Family  Consult/Referral To Chaplain  Spiritual Encounters  Spiritual Needs Prayer;Emotional

## 2016-09-12 NOTE — Progress Notes (Signed)
Goals of Care Discussion:   Discussed with father and mother (pt is unwed and had no children) of patient. Explained extremely poor prognosis (<5%) chance of recovery even ASIDE from his neuro status given his EF of 10%, ESRD, florid pulm edema with volume overload.  They asked whether he could go back to his previous functional status and life, I explained that there virtually no chance that this could happen.  He will require weeks-months in hospital and rehab assuming he survived this episode. In terms if his neuro status, this could be anywhere from moderate disability to vegetative state, I encouraged them to ask the neurologist about this as well.  They would like to pray for him and would like continued care. I recommended DNR status and that they should revisit this decision before time of tracheostomy or feeding tube. They would like him to remain full code for now with continuation of all life sustaining therapies and discuss further with their pastor.   Wells Guileseep Sharday Michl, MD.   Board Certified in Internal Medicine, Pulmonary Medicine, Critical Care Medicine, and Sleep Medicine.  [facility] Office Number: [phone] Pager: [phone]  Santiago Gladavid Kasa, M.D.  Billy Fischeravid Simonds, M.D

## 2016-09-12 NOTE — Progress Notes (Signed)
Per ELink draw met b and mag with a venipuncture d/t deranged lab values with PICC.  Lab notified

## 2016-09-12 NOTE — Progress Notes (Signed)
MEDICATION RELATED CONSULT NOTE - INITIAL   Pharmacy Consult for Electrolyte management  No Known Allergies  Patient Measurements: Height: 6' (182.9 cm) Weight: (!) 477 lb (216.4 kg) IBW/kg (Calculated) : 77.6   Vital Signs: Temp: 99.7 F (37.6 C) (06/16 1500) Temp Source: Core (Comment) (06/16 0600) BP: 101/68 (06/16 1500) Pulse Rate: 121 (06/16 1500) Intake/Output from previous day: 06/15 0701 - 06/16 0700 In: 4400.4 [I.V.:2959.4; NG/GT:1266; IV Piggyback:175] Out: 400 [Urine:400] Intake/Output from this shift: Total I/O In: 1164.4 [I.V.:994.4; NG/GT:60; IV Piggyback:110] Out: 265 [Urine:265]  Labs:  Recent Labs  09/10/16 0922  09/10/16 1634  09/11/16 0034 09/11/16 0458  09/12/16 0359 09/12/16 0904 09/12/16 1201  WBC  --   --   --   --   --  12.8*  --  12.4*  --   --   HGB  --   --   --   --   --  11.5*  --  9.8*  --   --   HCT  --   --   --   --   --  36.4*  --  31.0*  --   --   PLT  --   --   --   --   --  202  --  125*  --   --   APTT 59*  --  63*  --  91*  --   --   --   --   --   CREATININE 3.47*  < > 3.37*  < > 3.26* 3.01*  3.22*  < > 2.95* 3.29* 3.14*  MG 2.0  < > 1.9  < > 2.2 2.0  < > 1.4* 1.9 1.9  PHOS 5.8*  < > 5.1*  < > 5.0* 5.0*  < > 2.8 3.5 3.5  ALBUMIN 3.0*  < > 2.8*  < > 2.8* 2.8*  2.8*  < > 2.0* 2.5* 2.4*  PROT  --   --   --   --   --  6.9  --  5.2*  --   --   AST  --   --   --   --   --  40  --  28  --   --   ALT  --   --   --   --   --  23  --  15*  --   --   ALKPHOS  --   --   --   --   --  72  --  53  --   --   BILITOT  --   --   --   --   --  0.7  --  0.8  --   --   < > = values in this interval not displayed. Estimated Creatinine Clearance: 54.2 mL/min (A) (by C-G formula based on SCr of 3.14 mg/dL (H)).  Medical History: Past Medical History:  Diagnosis Date  . CHF (congestive heart failure) ([place])   . Diabetes mellitus without complication ([place])   . Hypertension     Medications:  Scheduled:  . budesonide (PULMICORT) nebulizer  solution  0.5 mg Nebulization BID  . chlorhexidine gluconate (MEDLINE KIT)  15 mL Mouth Rinse BID  . diltiazem  60 mg Oral Q8H  . docusate  100 mg Oral BID  . feeding supplement (PRO-STAT SUGAR FREE 64)  30 mL Oral Q4H  . insulin aspart  2-6 Units Subcutaneous Q4H  . insulin glargine  10 Units  Subcutaneous Q24H  . ipratropium-albuterol  3 mL Nebulization Q6H  . mouth rinse  15 mL Mouth Rinse 10 times per day  . pantoprazole sodium  40 mg Per Tube Daily  . sennosides  5 mL Oral BID  . sodium chloride flush  10-40 mL Intracatheter Q12H  . sodium chloride flush  10-40 mL Intracatheter Q12H   Infusions:  . sodium chloride Stopped (09/11/16 2000)  . amiodarone 30 mg/hr (09/12/16 1500)  . dexmedetomidine (PRECEDEX) IV infusion 0.699 mcg/kg/hr (09/12/16 1500)  . feeding supplement (VITAL HIGH PROTEIN) Stopped (09/12/16 0800)  . fentaNYL infusion INTRAVENOUS 75 mcg/hr (09/12/16 1500)  . heparin 1,850 Units/hr (09/12/16 1500)  . magnesium sulfate 1 - 4 g bolus IVPB 2 g (09/12/16 1454)  . milrinone 0.25 mcg/kg/min (09/12/16 1500)  . norepinephrine (LEVOPHED) Adult infusion 50 mcg/min (09/12/16 1500)  . propofol (DIPRIVAN) infusion Stopped (09/11/16 0943)  . pureflow 3 each (09/12/16 1057)  . valproate sodium Stopped (09/12/16 1056)   6/16 ~ 12:00  K = 3.7, Mag = 1.9.   Mag 2g IV ordered this afternoon.   BMP is ordered daily. Mag is ordered every 4 hours.    Olivia Canter, [facility] 09/12/2016,3:12 PM

## 2016-09-12 NOTE — Progress Notes (Signed)
Pt K+ 2.9 Contacted NP Maggie, she stated change to 4k on the CRRT.

## 2016-09-12 NOTE — Progress Notes (Addendum)
ANTICOAGULATION CONSULT NOTE   Pharmacy Consult for heparin drip management  Indication: elevated tropinins   Pharmacy consulted for heparin drip management for 51 yo male admitted s/p vfib arrest. Patient had CPR x 3 rounds. Patient intubated and sedated; s/p targeted temperature management and now being initiated on CRRT. Patient takes apixaban as an outpatient currently has elevated anti-Xa level. Patient currently receiving heparin at 1600 units/hr.   6/16 12:00  HL = 0.24.   6/16 20:04 HL= 0.34 6/17 00:15 HL = 0.30   Goal:  APTT: 68-109 anti-Xa: 0.3-0.7 Monitor platelets per protocol  Plan:  Will continue heparin drip at current rate of 1850 units/hr. Recheck heparin level and CBC with tomorrow AM labs.  Next HL ordered for 6/18 @ 0500  No Known Allergies  Patient Measurements: Height: 6' (182.9 cm) Weight: (!) 477 lb (216.4 kg) IBW/kg (Calculated) : 77.6 Heparin Dosing Weight: 129kg  Vital Signs: Temp: 99.7 F (37.6 C) (06/16 2030) Temp Source: Core (Comment) (06/16 2030) BP: 89/41 (06/16 2030) Pulse Rate: 117 (06/16 2030)  Labs:  Recent Labs  09/10/16 [id number]  09/10/16 1634  09/11/16 0034 09/11/16 0458  09/11/16 2056  09/12/16 0359  09/12/16 1201 09/12/16 1611 09/12/16 1753 09/12/16 2004  HGB  --   --   --   --   --  11.5*  --   --   --  9.8*  --   --   --   --   --   HCT  --   --   --   --   --  36.4*  --   --   --  31.0*  --   --   --   --   --   PLT  --   --   --   --   --  202  --   --   --  125*  --   --   --   --   --   APTT 59*  --  63*  --  91*  --   --   --   --   --   --   --   --   --   --   HEPARINUNFRC  --   --   --   --   --  0.58  < > 0.47  --   --   --  0.24*  --   --  0.34  CREATININE 3.47*  < > 3.37*  < > 3.26* 3.01*  3.22*  < >  --   < > 2.95*  < > 3.14* 2.70* 2.27*  --   < > = values in this interval not displayed.  Estimated Creatinine Clearance: 74.9 mL/min (A) (by C-G formula based on SCr of 2.27 mg/dL (H)).   Pharmacy will  continue to monitor and adjust per consult.   Erich MontaneMcBane,Demetris Capell S, PharmD, BCPS Clinical Pharmacist 09/12/2016 8:44 PM

## 2016-09-12 NOTE — Progress Notes (Signed)
Assumed care of patient at 1715, VSS, CRRT running w/o complications, pupils minimally responsive, no gag/cough reflex, pt breathing over ventilator.

## 2016-09-12 NOTE — Progress Notes (Signed)
Patient started vomiting tube feeds. Tube feeds stopped. Dr. Nicholos Johnsamachandran notified. Orders to continue to hold tube feeds at this time. Trudee KusterBrandi R Mansfield

## 2016-09-12 NOTE — Progress Notes (Addendum)
Patient had 3 runs of vtach, each 4-5 beats. Dr. Nicholos Johnsamachandran notified of first episode, no new orders. Notified of last two and gave verbal for 2g of magnesium IV. Order placed. Will recheck after completed. Trudee KusterBrandi R [place]field

## 2016-09-12 NOTE — Progress Notes (Addendum)
[facility] Critical Care Medicine Consultation    SYNOPSIS   51 yo male with DM, afib,  cardiac arrest at home with Vfib arrest. Resuscitated, s/p cooling. ARF on CRRT, hypotensive on pressors, florid vol overload and pulm edema on 100% on vent.   ASSESSMENT/PLAN     PULMONARY A:Acute hypoxic respiratory failure with Acute pulmonary edema secondary to cardiac arrest. Blood gas 7.28/57/82/26.8 Consistent with respiratory acidosis. Chest x-ray images personally reviewed, continues to show diffuse pulmonary edema with cardiomegaly. P:   Continue vent settings as below Continue PEEP of 10. Continue CRRT for fluid removal.  VENTILATOR SETTINGS: Vent Mode: PRVC FiO2 (%):  [92 %-100 %] 92 % Set Rate:  [24 bmp] 24 bmp Vt Set:  [500 mL] 500 mL PEEP:  [12 cmH20] 12 cmH20  CARDIOVASCULAR A: Cardiac and vfib arrest.  no evidence of acute MI, cardiology following Cardiomyopathy; Echo 09/10/16 EF=10% Atrial fibrillation, rate controlled. Cardiogenic shock with hypotension. P:  Continue  supportive measures.  Continue IV heparin, given history of atrial fibrillation. Continue levophed. Continue milrinone   HEMODYNAMICS: CVP:  [14 mmHg-59 mmHg] 57 mmHg  RENAL A:  Oliguric acute renal failure. P:   CRRT initiated 6/13; continue per nephrology.  Continue milrinone  INTAKE / OUTPUT:  Intake/Output Summary (Last 24 hours) at 09/12/16 1043 Last data filed at 09/12/16 0900  Gross per 24 hour  Intake          3645.38 ml  Output              350 ml  Net          3295.38 ml    GASTROINTESTINAL A:  -- HEMATOLOGIC A:  --  INFECTIOUS A:  -- P:    Micro/culture results:  BCx2 Negative to date UC -- Respiratory; 6/11: Normal respiratory flora.  Antibiotics: --  ENDOCRINE A:  DM , Blood glucose controlled P:   SSI  NEUROLOGIC A:  Acute anoxic encephalopathy;  EEG the results reviewed, consistent with above. Continued severe encephalopathy with minimal  responses. P:   Continue current measures. Discussed with family. They would like to continue current level of care.  MAJOR EVENTS/TEST RESULTS: 06/11:Cardiac arrest, admitted, intubated, CVL placed 06/12: Minimal urine output, CVL retracted. 06/13; rewarming, possible seizures noted on decreasing sedation; CRRT initiated, CT head diffuse cerebral edema.  06/15; Goals of care discussion: continue current measures, full code.   Best Practices  DVT Prophylaxis: IV heparin GI Prophylaxis: PPI  --------------------------------------- ---------------------------------------   Name: Isaiah [name] MRN: [id number] DOB: [birth year]    ADMISSION DATE:  09/09/2016 Subjective.  The patient is sedated and on a ventilator.  REVIEW OF SYSTEMS:   History could not be obtained due to unresponsiveness.   VITAL SIGNS: Temp:  [96.6 F (35.9 C)-99.3 F (37.4 C)] 98.4 F (36.9 C) (06/16 0945) Pulse Rate:  [30-153] 123 (06/16 0945) Resp:  [0-39] 34 (06/16 0945) BP: (79-130)/(44-100) 92/50 (06/16 0945) SpO2:  [91 %-98 %] 97 % (06/16 0945) FiO2 (%):  [92 %-100 %] 92 % (06/16 0740) Weight:  [216.4 kg (477 lb)] 216.4 kg (477 lb) (06/16 0300) HEMODYNAMICS: CVP:  [14 mmHg-59 mmHg] 57 mmHg VENTILATOR SETTINGS: Vent Mode: PRVC FiO2 (%):  [92 %-100 %] 92 % Set Rate:  [24 bmp] 24 bmp Vt Set:  [500 mL] 500 mL PEEP:  [12 cmH20] 12 cmH20 INTAKE / OUTPUT:  Intake/Output Summary (Last 24 hours) at 09/12/16 1043 Last data filed at 09/12/16 0900  Gross per 24 hour  Intake          3645.38 ml  Output              350 ml  Net          3295.38 ml    Physical Examination:   VS: BP (!) 92/50   Pulse (!) 123   Temp 98.4 F (36.9 C)   Resp (!) 34   Ht 6' (1.829 m)   Wt (!) 216.4 kg (477 lb)   SpO2 97%   BMI 64.69 kg/m   General Appearance: No distress  Neuro:without focal findings,sedations. Continued Negative oculocephalic reflex, negative gag, negative cough with deep suctioning. HEENT:   EOM intact, no ptosis, no other lesions noticed;  Pulmonary: decreased air entry bilaterally.  CardiovascularNormal S1,S2.  No m/r/g.    Abdomen: Benign, Soft, non-tender, No masses, hepatosplenomegaly, No lymphadenopathy Renal:  No costovertebral tenderness  GU:  Not performed at this time. Endoc: No evident thyromegaly, no signs of acromegaly. Skin:   warm, no rashes, no ecchymosis  Extremities: normal, no cyanosis, clubbing, no edema, warm with normal capillary refill.    LABS: Reviewed   LABORATORY PANEL:   CBC  Recent Labs Lab 09/12/16 0359  WBC 12.4*  HGB 9.8*  HCT 31.0*  PLT 125*    Chemistries   Recent Labs Lab 09/12/16 0359 09/12/16 0904  NA 124* 135  K 2.8* 3.7  CL 95* 100*  CO2 22 26  GLUCOSE 428* 113*  BUN 40* 44*  CREATININE 2.95* 3.29*  CALCIUM 6.6* 8.3*  MG 1.4* 1.9  PHOS 2.8 3.5  AST 28  --   ALT 15*  --   ALKPHOS 53  --   BILITOT 0.8  --      Recent Labs Lab 09/11/16 1153 09/11/16 1615 09/11/16 2004 09/12/16 0026 09/12/16 0417 09/12/16 0716  GLUCAP 101* 137* 140* 149* 106* 104*    Recent Labs Lab 09/09/16 0500 09/11/16 0553 09/12/16 0455  PHART 7.24* 7.22* 7.28*  PCO2ART 65* 71* 57*  PO2ART 64* 59* 82*    Recent Labs Lab 02/01/2017 0750  09/11/16 0458  09/12/16 0039 09/12/16 0359 09/12/16 0904  AST 102*  --  40  --   --  28  --   ALT 60  --  23  --   --  15*  --   ALKPHOS 93  --  72  --   --  53  --   BILITOT 1.4*  --  0.7  --   --  0.8  --   ALBUMIN 3.1*  < > 2.8*  2.8*  < > 2.1* 2.0* 2.5*  < > = values in this interval not displayed.  Cardiac Enzymes  Recent Labs Lab 09/08/16 0532  TROPONINI 0.15*    RADIOLOGY:       --Wells Guileseep Khyre Germond, MD.  Board Certified in Internal Medicine, Pulmonary Medicine, Critical Care Medicine, and Sleep Medicine.  ICU Pager [phone]  Pulmonary and Critical Care Office Number: [phone]  Santiago Gladavid Kasa, M.D.  Billy Fischeravid Simonds, M.D   09/12/2016, 10:43  AM  Critical Care Attestation.  I have personally obtained a history, examined the patient, evaluated laboratory and imaging results, formulated the assessment and plan and placed orders. The Patient requires high complexity decision making for assessment and support, frequent evaluation and titration of therapies, application of advanced monitoring technologies and extensive interpretation of multiple databases. The patient has critical illness that could lead imminently to failure of 1 or more organ systems  and requires the highest level of physician preparedness to intervene.  Critical Care Time devoted to patient care services described in this note is 30 minutes and is exclusive of time spent in procedures supervisory time of NP.

## 2016-09-12 NOTE — Progress Notes (Signed)
[facility]  ROUNDING NOTE   Subjective:   On CRRT 4K bath  Mother at bedside.   On milrinone On norepinephrine gtt  Objective:  Vital signs in last 24 hours:  Temp:  [96.6 F (35.9 C)-99.3 F (37.4 C)] 96.6 F (35.9 C) (06/16 0600) Pulse Rate:  [25-141] 88 (06/16 0600) Resp:  [0-34] 29 (06/16 0600) BP: (79-130)/(49-100) 95/60 (06/16 0600) SpO2:  [89 %-98 %] 94 % (06/16 0600) FiO2 (%):  [92 %-100 %] 92 % (06/16 0740) Weight:  [216.4 kg (477 lb)] 216.4 kg (477 lb) (06/16 0300)  Weight change: -2.722 kg (-6 lb) Filed Weights   09/10/16 0400 09/11/16 0410 09/12/16 0300  Weight: (!) 216.8 kg (478 lb) (!) 219.1 kg (483 lb) (!) 216.4 kg (477 lb)    Intake/Output: I/O last 3 completed shifts: In: 7003.5 [I.V.:5453; NG/GT:1385.5; IV Piggyback:165] Out: 470 [Urine:470]   Intake/Output this shift:  No intake/output data recorded.  Physical Exam: General: Critically ill, on arctic sun  Head: ETT  Eyes: Eyes closed  Neck: JVD not able to be examined due to obesity  Lungs:  Diminished, PRVC FiO2 100%  Heart: Irregular, tachycardia  Abdomen:  Soft, nontender, obese  Extremities: No peripheral edema.  Neurologic: Intubated, sedated  Skin: No lesions  Access: RIJ temp HD catheter 6/13 Dr. Ashby Dawes    Basic Metabolic Panel:  Recent Labs Lab 09/11/16 1215 09/11/16 1640 09/11/16 2040 09/12/16 0039 09/12/16 0359  NA 134* 134* 133* 125* 124*  K 4.0 3.9 3.8 2.9* 2.8*  CL 99* 100* 99* 96* 95*  CO2 25 24 26 23 22   GLUCOSE 141* 146* 137* 368* 428*  BUN 41* 46* 48* 42* 40*  CREATININE 3.33* 3.56* 3.64* 3.28* 2.95*  CALCIUM 8.1* 8.0* 8.2* 6.9* 6.6*  MG 1.9 1.8 1.9 1.6* 1.4*  PHOS 4.8* 4.6 3.5 2.8 2.8    Liver Function Tests:  Recent Labs Lab 09/03/2016 0750  09/11/16 0458  09/11/16 1215 09/11/16 1640 09/11/16 2040 09/12/16 0039 09/12/16 0359  AST 102*  --  40  --   --   --   --   --  28  ALT 60  --  23  --   --   --   --   --  15*  ALKPHOS  93  --  72  --   --   --   --   --  53  BILITOT 1.4*  --  0.7  --   --   --   --   --  0.8  PROT 7.0  --  6.9  --   --   --   --   --  5.2*  ALBUMIN 3.1*  < > 2.8*  2.8*  < > 2.8* 2.7* 2.6* 2.1* 2.0*  < > = values in this interval not displayed. No results for input(s): LIPASE, AMYLASE in the last 168 hours. No results for input(s): AMMONIA in the last 168 hours.  CBC:  Recent Labs Lab 09/16/2016 0750 09/08/16 0152 09/09/16 0431 09/11/16 0458 09/12/16 0359  WBC 15.4* 8.2 12.1* 12.8* 12.4*  NEUTROABS 12.6*  --   --   --   --   HGB 13.2 13.3 12.8* 11.5* 9.8*  HCT 40.7 41.3 39.8* 36.4* 31.0*  MCV 80.8 80.5 80.1 81.3 80.8  PLT 257 178 185 202 125*    Cardiac Enzymes:  Recent Labs Lab 09/09/2016 0750 09/23/2016 1208 08/29/2016 1632 09/08/16 0152 09/08/16 0532  TROPONINI 0.08* 0.44* 0.47* 0.21* 0.15*  BNP: Invalid input(s): POCBNP  CBG:  Recent Labs Lab 09/11/16 1615 09/11/16 2004 09/12/16 0026 09/12/16 0417 09/12/16 0716  GLUCAP 137* 140* 149* 106* 104*    Microbiology: Results for orders placed or performed during the hospital encounter of 09/05/2016  Culture, blood (routine x 2)     Status: None (Preliminary result)   Collection Time: 08/31/2016  8:18 AM  Result Value Ref Range Status   Specimen Description BLOOD L AC  Final   Special Requests   Final    BOTTLES DRAWN AEROBIC AND ANAEROBIC Blood Culture adequate volume   Culture NO GROWTH 4 DAYS  Final   Report Status PENDING  Incomplete  MRSA PCR Screening     Status: None   Collection Time: 08/28/2016  9:46 AM  Result Value Ref Range Status   MRSA by PCR NEGATIVE NEGATIVE Final    Comment:        The GeneXpert MRSA Assay (FDA approved for NASAL specimens only), is one component of a comprehensive MRSA colonization surveillance program. It is not intended to diagnose MRSA infection nor to guide or monitor treatment for MRSA infections.   Culture, blood (routine x 2)     Status: None (Preliminary result)    Collection Time: 09/13/2016 11:45 AM  Result Value Ref Range Status   Specimen Description BLOOD L HAND  Final   Special Requests   Final    BOTTLES DRAWN AEROBIC ONLY Blood Culture results may not be optimal due to an inadequate volume of blood received in culture bottles   Culture NO GROWTH 4 DAYS  Final   Report Status PENDING  Incomplete  Culture, respiratory (NON-Expectorated)     Status: None   Collection Time: 09/05/2016  6:32 PM  Result Value Ref Range Status   Specimen Description TRACHEAL ASPIRATE  Final   Special Requests NONE  Final   Gram Stain   Final    FEW WBC PRESENT, PREDOMINANTLY PMN MODERATE GRAM POSITIVE COCCI IN CHAINS IN PAIRS MODERATE GRAM NEGATIVE RODS    Culture   Final    Consistent with normal respiratory flora. Performed at [facility], [place] [address], [place],  [address]    Report Status 09/10/2016 FINAL  Final    Coagulation Studies: No results for input(s): LABPROT, INR in the last 72 hours.  Urinalysis: No results for input(s): COLORURINE, LABSPEC, PHURINE, GLUCOSEU, HGBUR, BILIRUBINUR, KETONESUR, PROTEINUR, UROBILINOGEN, NITRITE, LEUKOCYTESUR in the last 72 hours.  Invalid input(s): APPERANCEUR    Imaging: Dg Chest 1 View  Result Date: 09/12/2016 CLINICAL DATA:  Dyspnea EXAM: CHEST 1 VIEW COMPARISON:  09/11/2016 FINDINGS: Endotracheal tube remains in good position. Right jugular central venous catheter tip in the right atrium unchanged. Cardiac enlargement. Extensive diffuse bilateral airspace disease unchanged. Negative for pleural effusion IMPRESSION: Diffuse bilateral airspace disease unchanged. Endotracheal tube remains in good position. Electronically Signed   By: Franchot Gallo M.D.   On: 09/12/2016 07:18   Dg Chest 1 View  Result Date: 09/11/2016 CLINICAL DATA:  Dyspnea, recent cardiac arrest, anoxic ischemic encephalopathy, end-stage CHF. EXAM: CHEST 1 VIEW COMPARISON:  Portable chest x-ray of September 09, 2016 FINDINGS: The  lungs are mildly hypoinflated. The cardiac silhouette remains enlarged. The pulmonary vascularity is mildly engorged but less conspicuous today. The interstitial markings remain increased. The endotracheal tube tip lies 2.9 cm above the carina. The esophagogastric tube tip projects below the inferior margin of the image. The right internal jugular venous catheter tip projects at the cavoatrial  junction or in the right atrium. A left internal jugular venous catheter has its tip terminating at the junction of the left internal jugular vein with the left subclavian vein. IMPRESSION: CHF with mild interstitial edema. Bilateral hypoinflation. No pneumonia nor pleural effusion. The support tubes are in reasonable position. Electronically Signed   By: David  Martinique M.D.   On: 09/11/2016 07:13   Dg Chest Port 1 View  Addendum Date: 09/12/2016   ADDENDUM REPORT: 09/12/2016 02:43 ADDENDUM: The patient's left PICC is noted ending about the distal SVC. The right IJ line is grossly unchanged in appearance, though not fully characterized. These results were called by telephone at the time of interpretation on 09/12/2016 at 2:42 am to [facility] at the [place], who verbally acknowledged these results. Electronically Signed   By: Garald Balding M.D.   On: 09/12/2016 02:43   Result Date: 09/12/2016 CLINICAL DATA:  Line placement. EXAM: PORTABLE CHEST 1 VIEW COMPARISON:  09/11/2016 FINDINGS: Right internal jugular approach central venous catheter tip is obscured by overlying soft tissues. Enteric catheter collimated off the image. Endotracheal tube in stable position. Left internal jugular approach venous catheter terminates at the expected location of the junction of the left internal jugular vein and left subclavian vein. Enlarged cardiac silhouette.  Mediastinal contours appear intact. Low lung volumes with interstitial pulmonary edema. No evidence of pneumothorax. Osseous structures are without acute  abnormality. Soft tissues are grossly normal. IMPRESSION: Right internal jugular approach central venous catheter tip not visualized due to overlying soft tissues. The remaining of the supporting lines and tubes in stable position. Enlarged cardiac silhouette and pulmonary edema. Low lung volumes. Electronically Signed: By: Fidela Salisbury M.D. On: 09/11/2016 19:24   Dg Chest Port 1 View  Result Date: 09/11/2016 CLINICAL DATA:  Left PICC placement.  Initial encounter. EXAM: PORTABLE CHEST 1 VIEW COMPARISON:  Chest radiograph performed earlier today at 7:02 p.m. FINDINGS: The patient's left PICC is noted ending about the distal SVC. The endotracheal tube is seen ending 4-5 cm above the carina. A right IJ line is also noted ending about the distal SVC. The lungs are hypoexpanded. Vascular congestion is noted. Bilateral central airspace opacification raises concern for pulmonary edema. No definite pleural effusion or pneumothorax is seen. The cardiomediastinal silhouette is mildly enlarged. No acute osseous abnormalities are seen. IMPRESSION: 1. Left PICC noted ending about the distal SVC. 2. Endotracheal tube seen ending 4-5 cm above the carina. 3. Lungs hypoexpanded. Vascular congestion and mild cardiomegaly. Bilateral central airspace opacification raises concern for pulmonary edema. Electronically Signed   By: Garald Balding M.D.   On: 09/11/2016 22:08     Medications:   . sodium chloride Stopped (09/11/16 2000)  . amiodarone 30 mg/hr (09/12/16 0600)  . dexmedetomidine (PRECEDEX) IV infusion 0.6 mcg/kg/hr (09/12/16 0858)  . feeding supplement (VITAL HIGH PROTEIN) 1,000 mL (09/12/16 0600)  . fentaNYL infusion INTRAVENOUS Stopped (09/12/16 0756)  . heparin 1,600 Units/hr (09/12/16 0600)  . milrinone 0.25 mcg/kg/min (09/12/16 0858)  . norepinephrine (LEVOPHED) Adult infusion 40 mcg/min (09/12/16 0658)  . propofol (DIPRIVAN) infusion Stopped (09/11/16 0943)  . pureflow 2,000 mL/hr at 09/12/16  0300  . valproate sodium Stopped (09/12/16 0319)   . budesonide (PULMICORT) nebulizer solution  0.5 mg Nebulization BID  . chlorhexidine gluconate (MEDLINE KIT)  15 mL Mouth Rinse BID  . diltiazem  60 mg Oral Q8H  . docusate  100 mg Oral BID  . feeding supplement (PRO-STAT SUGAR FREE 64)  30 mL  Oral Q4H  . insulin aspart  2-6 Units Subcutaneous Q4H  . insulin glargine  10 Units Subcutaneous Q24H  . ipratropium-albuterol  3 mL Nebulization Q6H  . mouth rinse  15 mL Mouth Rinse 10 times per day  . pantoprazole sodium  40 mg Per Tube Daily  . sennosides  5 mL Oral BID  . sodium chloride flush  10-40 mL Intracatheter Q12H  . sodium chloride flush  10-40 mL Intracatheter Q12H   acetaminophen **OR** acetaminophen, fentaNYL, heparin, polyvinyl alcohol, sodium chloride flush, sodium chloride flush  Assessment/ Plan:  Mr. BURDELL PEED is a 51 y.o. black male with diabetes mellitus type II, hyeprtension, congestive heart failure, atrial fibrillation,  who was admitted to [facility] on 09/22/2016 for Cardiac arrest ([place]) [I46.9]  1. Acute renal failure: on chronic kidney disease stage III baseline creatinine of 1.4 04/10/16.  Nonoliguric On CRRT 4K bath. No UF - Monitor renal function, volume status, urine output and electrolytes. Low threshold to restart hemodialysis  2. Cardiogenic shock with hypotension: on vasopressors: norepinephrine and milrinone - appreciate cards input  3. Respiratory Failure: intubated and sedate. PRVC FiO2 100%  Overall prognosis is poor. Discussed with patient's parents. Appreciate critical care and neuro input.   LOS: 5 Javaeh Mus[place] 6/16/20188:59 AM

## 2016-09-13 ENCOUNTER — Inpatient Hospital Stay: Payer: BLUE CROSS/BLUE SHIELD

## 2016-09-13 LAB — RENAL FUNCTION PANEL
ALBUMIN: 2.2 g/dL — AB (ref 3.5–5.0)
ALBUMIN: 2.2 g/dL — AB (ref 3.5–5.0)
ALBUMIN: 2.2 g/dL — AB (ref 3.5–5.0)
ALBUMIN: 2.1 g/dL — AB (ref 3.5–5.0)
ANION GAP: 6 (ref 5–15)
ANION GAP: 7 (ref 5–15)
ANION GAP: 8 (ref 5–15)
ANION GAP: 8 (ref 5–15)
ANION GAP: 8 (ref 5–15)
Albumin: 2.3 g/dL — ABNORMAL LOW (ref 3.5–5.0)
Albumin: 2.1 g/dL — ABNORMAL LOW (ref 3.5–5.0)
Anion gap: 6 (ref 5–15)
BUN: 37 mg/dL — AB (ref 6–20)
BUN: 37 mg/dL — ABNORMAL HIGH (ref 6–20)
BUN: 38 mg/dL — ABNORMAL HIGH (ref 6–20)
BUN: 39 mg/dL — AB (ref 6–20)
BUN: 40 mg/dL — AB (ref 6–20)
BUN: 40 mg/dL — ABNORMAL HIGH (ref 6–20)
CALCIUM: 8.2 mg/dL — AB (ref 8.9–10.3)
CALCIUM: 8.3 mg/dL — AB (ref 8.9–10.3)
CALCIUM: 8.4 mg/dL — AB (ref 8.9–10.3)
CHLORIDE: 99 mmol/L — AB (ref 101–111)
CO2: 24 mmol/L (ref 22–32)
CO2: 25 mmol/L (ref 22–32)
CO2: 26 mmol/L (ref 22–32)
CO2: 27 mmol/L (ref 22–32)
CO2: 27 mmol/L (ref 22–32)
CO2: 27 mmol/L (ref 22–32)
CREATININE: 2.78 mg/dL — AB (ref 0.61–1.24)
CREATININE: 2.51 mg/dL — AB (ref 0.61–1.24)
Calcium: 8.2 mg/dL — ABNORMAL LOW (ref 8.9–10.3)
Calcium: 8.3 mg/dL — ABNORMAL LOW (ref 8.9–10.3)
Calcium: 8.1 mg/dL — ABNORMAL LOW (ref 8.9–10.3)
Chloride: 101 mmol/L (ref 101–111)
Chloride: 102 mmol/L (ref 101–111)
Chloride: 101 mmol/L (ref 101–111)
Chloride: 102 mmol/L (ref 101–111)
Chloride: 101 mmol/L (ref 101–111)
Creatinine, Ser: 2.91 mg/dL — ABNORMAL HIGH (ref 0.61–1.24)
Creatinine, Ser: 2.65 mg/dL — ABNORMAL HIGH (ref 0.61–1.24)
Creatinine, Ser: 2.54 mg/dL — ABNORMAL HIGH (ref 0.61–1.24)
Creatinine, Ser: 2.54 mg/dL — ABNORMAL HIGH (ref 0.61–1.24)
GFR calc Af Amer: 29 mL/min — ABNORMAL LOW (ref >60)
GFR calc Af Amer: 31 mL/min — ABNORMAL LOW (ref >60)
GFR calc Af Amer: 33 mL/min — ABNORMAL LOW (ref >60)
GFR calc non Af Amer: 24 mL/min — ABNORMAL LOW (ref >60)
GFR calc non Af Amer: 27 mL/min — ABNORMAL LOW (ref >60)
GFR calc non Af Amer: 28 mL/min — ABNORMAL LOW (ref >60)
GFR, EST AFRICAN AMERICAN: 28 mL/min — AB (ref >60)
GFR, EST AFRICAN AMERICAN: 33 mL/min — AB (ref >60)
GFR, EST AFRICAN AMERICAN: 33 mL/min — AB (ref >60)
GFR, EST NON AFRICAN AMERICAN: 25 mL/min — AB (ref >60)
GFR, EST NON AFRICAN AMERICAN: 29 mL/min — AB (ref >60)
GFR, EST NON AFRICAN AMERICAN: 28 mL/min — AB (ref >60)
GLUCOSE: 114 mg/dL — AB (ref 65–99)
GLUCOSE: 119 mg/dL — AB (ref 65–99)
GLUCOSE: 125 mg/dL — AB (ref 65–99)
GLUCOSE: 127 mg/dL — AB (ref 65–99)
GLUCOSE: 143 mg/dL — AB (ref 65–99)
Glucose, Bld: 135 mg/dL — ABNORMAL HIGH (ref 65–99)
PHOSPHORUS: 3.4 mg/dL (ref 2.5–4.6)
PHOSPHORUS: 3.6 mg/dL (ref 2.5–4.6)
PHOSPHORUS: 3.7 mg/dL (ref 2.5–4.6)
PHOSPHORUS: 3.6 mg/dL (ref 2.5–4.6)
POTASSIUM: 3.9 mmol/L (ref 3.5–5.1)
POTASSIUM: 4 mmol/L (ref 3.5–5.1)
POTASSIUM: 4.2 mmol/L (ref 3.5–5.1)
POTASSIUM: 4.2 mmol/L (ref 3.5–5.1)
Phosphorus: 3.4 mg/dL (ref 2.5–4.6)
Phosphorus: 3.3 mg/dL (ref 2.5–4.6)
Potassium: 3.9 mmol/L (ref 3.5–5.1)
Potassium: 4.2 mmol/L (ref 3.5–5.1)
SODIUM: 134 mmol/L — AB (ref 135–145)
SODIUM: 134 mmol/L — AB (ref 135–145)
SODIUM: 135 mmol/L (ref 135–145)
SODIUM: 135 mmol/L (ref 135–145)
Sodium: 134 mmol/L — ABNORMAL LOW (ref 135–145)
Sodium: 133 mmol/L — ABNORMAL LOW (ref 135–145)

## 2016-09-13 LAB — MAGNESIUM
MAGNESIUM: 2.1 mg/dL (ref 1.7–2.4)
MAGNESIUM: 2 mg/dL (ref 1.7–2.4)
MAGNESIUM: 2 mg/dL (ref 1.7–2.4)
MAGNESIUM: 2 mg/dL (ref 1.7–2.4)
Magnesium: 2.2 mg/dL (ref 1.7–2.4)
Magnesium: 2 mg/dL (ref 1.7–2.4)

## 2016-09-13 LAB — TROPONIN I
Troponin I: 0.05 ng/mL (ref <0.03)
Troponin I: 0.06 ng/mL (ref <0.03)
Troponin I: 0.05 ng/mL (ref <0.03)

## 2016-09-13 LAB — BLOOD GAS, ARTERIAL
Acid-base deficit: 0 mmol/L (ref 0.0–2.0)
Acid-base deficit: 2.8 mmol/L — ABNORMAL HIGH (ref 0.0–2.0)
BICARBONATE: 25.8 mmol/L (ref 20.0–28.0)
Bicarbonate: 27.4 mmol/L (ref 20.0–28.0)
FIO2: 1
FIO2: 1
LHR: 24 {breaths}/min
LHR: 24 {breaths}/min
MECHANICAL RATE: 24
O2 Saturation: 81.9 %
O2 Saturation: UNDETERMINED %
PEEP/CPAP: 12 cmH2O
PEEP: 12 cmH2O
PH ART: 7.22 — AB (ref 7.350–7.450)
PO2 ART: 52 mmHg — AB (ref 83.0–108.0)
Patient temperature: 37
VT: 500 mL
VT: 500 mL
pCO2 arterial: 57 mmHg — ABNORMAL HIGH (ref 32.0–48.0)
pCO2 arterial: 63 mmHg — ABNORMAL HIGH (ref 32.0–48.0)
pH, Arterial: 7.29 — ABNORMAL LOW (ref 7.350–7.450)
pO2, Arterial: UNDETERMINED mmHg (ref 83.0–108.0)

## 2016-09-13 LAB — HEPARIN LEVEL (UNFRACTIONATED)
HEPARIN UNFRACTIONATED: 0.34 [IU]/mL (ref 0.30–0.70)
Heparin Unfractionated: 0.3 IU/mL (ref 0.30–0.70)

## 2016-09-13 LAB — GLUCOSE, CAPILLARY
GLUCOSE-CAPILLARY: 119 mg/dL — AB (ref 65–99)
GLUCOSE-CAPILLARY: 122 mg/dL — AB (ref 65–99)
GLUCOSE-CAPILLARY: 122 mg/dL — AB (ref 65–99)
GLUCOSE-CAPILLARY: 129 mg/dL — AB (ref 65–99)
GLUCOSE-CAPILLARY: 133 mg/dL — AB (ref 65–99)
GLUCOSE-CAPILLARY: 151 mg/dL — AB (ref 65–99)

## 2016-09-13 LAB — TRIGLYCERIDES: Triglycerides: 101 mg/dL (ref <150)

## 2016-09-13 MED ORDER — DOCUSATE SODIUM 50 MG/5ML PO LIQD
100.0000 mg | Freq: Two times a day (BID) | ORAL | Status: DC
  Administered 2016-09-14 (×2): 100 mg
  Filled 2016-09-13 (×2): qty 10

## 2016-09-13 MED ORDER — PROPOFOL 1000 MG/100ML IV EMUL
5.0000 ug/kg/min | INTRAVENOUS | Status: DC
  Administered 2016-09-13 – 2016-09-14 (×2): 5 ug/kg/min via INTRAVENOUS
  Administered 2016-09-14: 20 ug/kg/min via INTRAVENOUS
  Administered 2016-09-14 (×3): 15 ug/kg/min via INTRAVENOUS
  Administered 2016-09-14: 25 ug/kg/min via INTRAVENOUS
  Administered 2016-09-14: 15 ug/kg/min via INTRAVENOUS
  Administered 2016-09-14: 20 ug/kg/min via INTRAVENOUS
  Administered 2016-09-15: 15 ug/kg/min via INTRAVENOUS
  Filled 2016-09-13 (×8): qty 100

## 2016-09-13 MED ORDER — VASOPRESSIN 20 UNIT/ML IV SOLN
0.0300 [IU]/min | INTRAVENOUS | Status: DC
  Administered 2016-09-13 – 2016-09-15 (×4): 0.03 [IU]/min via INTRAVENOUS
  Filled 2016-09-13 (×4): qty 2

## 2016-09-13 MED ORDER — ATROPINE SULFATE 1 MG/10ML IJ SOSY
1.0000 mg | PREFILLED_SYRINGE | Freq: Once | INTRAMUSCULAR | Status: AC
  Administered 2016-09-13: 1 mg via INTRAVENOUS

## 2016-09-13 MED ORDER — AMIODARONE IV BOLUS ONLY 150 MG/100ML
150.0000 mg | Freq: Once | INTRAVENOUS | Status: AC
  Administered 2016-09-13: 150 mg via INTRAVENOUS
  Filled 2016-09-13: qty 100

## 2016-09-13 MED ORDER — SENNOSIDES 8.8 MG/5ML PO SYRP
5.0000 mL | ORAL_SOLUTION | Freq: Two times a day (BID) | ORAL | Status: DC
  Administered 2016-09-14 – 2016-09-19 (×7): 5 mL
  Filled 2016-09-13 (×11): qty 5

## 2016-09-13 MED ORDER — SODIUM CHLORIDE 0.9 % IV SOLN
0.0000 ug/min | INTRAVENOUS | Status: DC
  Administered 2016-09-13: 20 ug/min via INTRAVENOUS
  Filled 2016-09-13 (×4): qty 1

## 2016-09-13 MED ORDER — DOPAMINE-DEXTROSE 3.2-5 MG/ML-% IV SOLN
0.0000 ug/kg/min | Freq: Once | INTRAVENOUS | Status: AC
  Administered 2016-09-13: 20 mg via INTRAVENOUS

## 2016-09-13 MED ORDER — PRO-STAT SUGAR FREE PO LIQD
30.0000 mL | ORAL | Status: DC
  Administered 2016-09-14 – 2016-09-17 (×4): 30 mL

## 2016-09-13 MED ORDER — DOPAMINE-DEXTROSE 3.2-5 MG/ML-% IV SOLN
INTRAVENOUS | Status: AC
  Administered 2016-09-13: 20 mg via INTRAVENOUS
  Filled 2016-09-13: qty 250

## 2016-09-13 NOTE — Progress Notes (Signed)
Subjective: Patient remains unresponsive.  Was bradycardic and hypotensive this morning.    Objective: Current vital signs: BP 92/69   Pulse (!) 150   Temp 98.4 F (36.9 C)   Resp (!) 29   Ht 6' (1.829 m)   Wt (!) 219.1 kg (483 lb)   SpO2 92%   BMI 65.51 kg/m  Vital signs in last 24 hours: Temp:  [98.2 F (36.8 C)-100 F (37.8 C)] 98.4 F (36.9 C) (06/17 1000) Pulse Rate:  [52-176] 150 (06/17 1000) Resp:  [18-44] 29 (06/17 1000) BP: (43-124)/(32-94) 92/69 (06/17 1000) SpO2:  [68 %-100 %] 92 % (06/17 1103) FiO2 (%):  [100 %] 100 % (06/17 1103) Weight:  [219.1 kg (483 lb)] 219.1 kg (483 lb) (06/17 0330)  Intake/Output from previous day: 06/16 0701 - 06/17 0700 In: 3311.2 [I.V.:3031.2; NG/GT:60; IV Piggyback:220] Out: 720 [Urine:720] Intake/Output this shift: Total I/O In: 911.3 [I.V.:766.3; NG/GT:85; IV Piggyback:60] Out: 62 [Urine:25; Other:37] Nutritional status: Diet NPO time specified  Neurologic Exam: Mental Status: Patient does not respond to verbal stimuli. Does not respond to deep sternal rub. Does not follow commands. No verbalizations noted.  Cranial Nerves: II: patient does not respond confrontation bilaterally, pupils right 14m, left 311mand reactivebilaterally III,IV,VI: doll's response absent bilaterally.  V,VII: corneal reflex absent bilaterally VIII: patient does not respond to verbal stimuli IX,X: gag reflex reduced, XI: trapezius strength unable to test bilaterally XII: tongue strength unable to test Motor: Extremities flaccid throughout. No spontaneous movement noted. No purposeful movements noted. Sensory: Does not respond to noxious stimuli in any extremity. Deep Tendon Reflexes:  1+ throughout Plantars: Mute bilaterally Cerebellar: Unable to perform  Lab Results: Basic Metabolic Panel:  Recent Labs Lab 09/12/16 1753 09/12/16 2004 09/13/16 0018 09/13/16 0503 09/13/16 0817  NA 125* 133* 134* 135 134*  K 2.9* 4.0 4.0 3.9  3.9  CL 98* 99* 101 102 101  CO2 21* 24 27 27 25   GLUCOSE 325* 113* 127* 119* 114*  BUN 34* 42* 40* 40* 39*  CREATININE 2.27* 3.09* 2.91* 2.78* 2.65*  CALCIUM 6.4* 8.1* 8.4* 8.2* 8.2*  MG 13.5* 2.2 2.2 2.1 2.0  PHOS 2.7 3.6 3.4 3.4 3.6    Liver Function Tests:  Recent Labs Lab 09/14/2016 0750  09/11/16 0458  09/12/16 0359  09/12/16 1753 09/12/16 2004 09/13/16 0018 09/13/16 0503 09/13/16 0817  AST 102*  --  40  --  28  --   --   --   --   --   --   ALT 60  --  23  --  15*  --   --   --   --   --   --   ALKPHOS 93  --  72  --  53  --   --   --   --   --   --   BILITOT 1.4*  --  0.7  --  0.8  --   --   --   --   --   --   PROT 7.0  --  6.9  --  5.2*  --   --   --   --   --   --   ALBUMIN 3.1*  < > 2.8*  2.8*  < > 2.0*  < > 1.7* 2.3* 2.3* 2.2* 2.2*  < > = values in this interval not displayed. No results for input(s): LIPASE, AMYLASE in the last 168 hours. No results for input(s): AMMONIA in the last 168 hours.  CBC:  Recent Labs Lab 09/23/2016 0750 09/08/16 0152 09/09/16 0431 09/11/16 0458 09/12/16 0359  WBC 15.4* 8.2 12.1* 12.8* 12.4*  NEUTROABS 12.6*  --   --   --   --   HGB 13.2 13.3 12.8* 11.5* 9.8*  HCT 40.7 41.3 39.8* 36.4* 31.0*  MCV 80.8 80.5 80.1 81.3 80.8  PLT 257 178 185 202 125*    Cardiac Enzymes:  Recent Labs Lab 09/20/2016 1208 09/26/2016 1632 09/08/16 0152 09/08/16 0532 09/13/16 0811  TROPONINI 0.44* 0.47* 0.21* 0.15* 0.05*    Lipid Panel:  Recent Labs Lab 09/10/16 1258  TRIG 37    CBG:  Recent Labs Lab 09/12/16 1732 09/12/16 1954 09/13/16 0516 09/13/16 0717 09/13/16 1107  GLUCAP 284* 108* 23* 53* 46*    Microbiology: Results for orders placed or performed during the hospital encounter of 09/09/2016  Culture, blood (routine x 2)     Status: None (Preliminary result)   Collection Time: 09/13/2016  8:18 AM  Result Value Ref Range Status   Specimen Description BLOOD L AC  Final   Special Requests   Final    BOTTLES DRAWN  AEROBIC AND ANAEROBIC Blood Culture adequate volume   Culture NO GROWTH 4 DAYS  Final   Report Status PENDING  Incomplete  MRSA PCR Screening     Status: None   Collection Time: 09/03/2016  9:46 AM  Result Value Ref Range Status   MRSA by PCR NEGATIVE NEGATIVE Final    Comment:        The GeneXpert MRSA Assay (FDA approved for NASAL specimens only), is one component of a comprehensive MRSA colonization surveillance program. It is not intended to diagnose MRSA infection nor to guide or monitor treatment for MRSA infections.   Culture, blood (routine x 2)     Status: None (Preliminary result)   Collection Time: 09/26/2016 11:45 AM  Result Value Ref Range Status   Specimen Description BLOOD L HAND  Final   Special Requests   Final    BOTTLES DRAWN AEROBIC ONLY Blood Culture results may not be optimal due to an inadequate volume of blood received in culture bottles   Culture NO GROWTH 4 DAYS  Final   Report Status PENDING  Incomplete  Culture, respiratory (NON-Expectorated)     Status: None   Collection Time: 09/05/2016  6:32 PM  Result Value Ref Range Status   Specimen Description TRACHEAL ASPIRATE  Final   Special Requests NONE  Final   Gram Stain   Final    FEW WBC PRESENT, PREDOMINANTLY PMN MODERATE GRAM POSITIVE COCCI IN CHAINS IN PAIRS MODERATE GRAM NEGATIVE RODS    Culture   Final    Consistent with normal respiratory flora. Performed at [facility], [place] [address], [place], [place] [address]    Report Status 09/10/2016 FINAL  Final    Coagulation Studies: No results for input(s): LABPROT, INR in the last 72 hours.  Imaging: Dg Chest 1 View  Result Date: 09/13/2016 CLINICAL DATA:  Routine am cxr for Anoxic-ischemic encephalopathy EXAM: CHEST 1 VIEW COMPARISON:  09/12/2016 FINDINGS: Right IJ central line tip overlies the level of superior vena cava. Endotracheal tube is in place, 5.6 cm above the carina. Airspace filling opacity within the lungs bilaterally,  increased on the left since the prior study. The heart is enlarged. IMPRESSION: Persistent bilateral airspace filling opacities, increased on the left. Electronically Signed   By: Nolon Nations M.D.   On: 09/13/2016 08:59   Dg Chest  1 View  Result Date: 09/12/2016 CLINICAL DATA:  Dyspnea EXAM: CHEST 1 VIEW COMPARISON:  09/11/2016 FINDINGS: Endotracheal tube remains in good position. Right jugular central venous catheter tip in the right atrium unchanged. Cardiac enlargement. Extensive diffuse bilateral airspace disease unchanged. Negative for pleural effusion IMPRESSION: Diffuse bilateral airspace disease unchanged. Endotracheal tube remains in good position. Electronically Signed   By: Franchot Gallo M.D.   On: 09/12/2016 07:18   Dg Chest Port 1 View  Result Date: 09/13/2016 CLINICAL DATA:  Acute respiratory failure. EXAM: PORTABLE CHEST 1 VIEW COMPARISON:  09/13/16 FINDINGS: The ET tube tip is above the carina. There is a right IJ catheter identified. The tip of which cannot be confidently identified. The heart size is enlarged. Large left pleural effusion is again noted and appears unchanged. There is significantly diminished aeration to the left lung, unchanged from previous exam. Diffuse right lung opacities are also unchanged. IMPRESSION: 1. No change in aeration a lungs compared with previous exam. 2. Cannot confirm location of tip of right IJ catheter. Electronically Signed   By: Kerby Moors M.D.   On: 09/13/2016 07:49   Dg Chest Port 1 View  Addendum Date: 09/12/2016   ADDENDUM REPORT: 09/12/2016 02:43 ADDENDUM: The patient's left PICC is noted ending about the distal SVC. The right IJ line is grossly unchanged in appearance, though not fully characterized. These results were called by telephone at the time of interpretation on 09/12/2016 at 2:42 am to [facility] at the [place], who verbally acknowledged these results. Electronically Signed   By: Garald Balding M.D.   On:  09/12/2016 02:43   Result Date: 09/12/2016 CLINICAL DATA:  Line placement. EXAM: PORTABLE CHEST 1 VIEW COMPARISON:  09/11/2016 FINDINGS: Right internal jugular approach central venous catheter tip is obscured by overlying soft tissues. Enteric catheter collimated off the image. Endotracheal tube in stable position. Left internal jugular approach venous catheter terminates at the expected location of the junction of the left internal jugular vein and left subclavian vein. Enlarged cardiac silhouette.  Mediastinal contours appear intact. Low lung volumes with interstitial pulmonary edema. No evidence of pneumothorax. Osseous structures are without acute abnormality. Soft tissues are grossly normal. IMPRESSION: Right internal jugular approach central venous catheter tip not visualized due to overlying soft tissues. The remaining of the supporting lines and tubes in stable position. Enlarged cardiac silhouette and pulmonary edema. Low lung volumes. Electronically Signed: By: Fidela Salisbury M.D. On: 09/11/2016 19:24   Dg Chest Port 1 View  Result Date: 09/11/2016 CLINICAL DATA:  Left PICC placement.  Initial encounter. EXAM: PORTABLE CHEST 1 VIEW COMPARISON:  Chest radiograph performed earlier today at 7:02 p.m. FINDINGS: The patient's left PICC is noted ending about the distal SVC. The endotracheal tube is seen ending 4-5 cm above the carina. A right IJ line is also noted ending about the distal SVC. The lungs are hypoexpanded. Vascular congestion is noted. Bilateral central airspace opacification raises concern for pulmonary edema. No definite pleural effusion or pneumothorax is seen. The cardiomediastinal silhouette is mildly enlarged. No acute osseous abnormalities are seen. IMPRESSION: 1. Left PICC noted ending about the distal SVC. 2. Endotracheal tube seen ending 4-5 cm above the carina. 3. Lungs hypoexpanded. Vascular congestion and mild cardiomegaly. Bilateral central airspace opacification raises  concern for pulmonary edema. Electronically Signed   By: Garald Balding M.D.   On: 09/11/2016 22:08    Medications:  I have reviewed the patient's current medications. Scheduled: . budesonide (PULMICORT) nebulizer solution  0.5 mg Nebulization BID  . chlorhexidine gluconate (MEDLINE KIT)  15 mL Mouth Rinse BID  . diltiazem  60 mg Oral Q8H  . docusate  100 mg Oral BID  . feeding supplement (PRO-STAT SUGAR FREE 64)  30 mL Oral Q4H  . insulin aspart  2-6 Units Subcutaneous Q4H  . insulin glargine  10 Units Subcutaneous Q24H  . ipratropium-albuterol  3 mL Nebulization Q6H  . mouth rinse  15 mL Mouth Rinse 10 times per day  . pantoprazole sodium  40 mg Per Tube Daily  . sennosides  5 mL Oral BID  . sodium chloride flush  10-40 mL Intracatheter Q12H  . sodium chloride flush  10-40 mL Intracatheter Q12H    Assessment/Plan: Patient remains unresponsive  Now >72 hours post warming from hypothermia protocol.  Has been unable to tolerate coming completely off sedation.  Despite this does not fulfill criteria for brain death on exam.  Remains on Depakote.   Discussed prognosis with family who want to maintain patient for 1-2 weeks before making decision for withdrawal.  Also do not want DNR.  Prognosis poor due to multi-organ failure but patient not brain dead.  Unable to perform more extensive imaging due to size.  May benefit from a perfusion study but patient currently too unstable.   Would continue Depacon at current dose.  Case discussed with Dr. Ashby Dawes.      LOS: 6 days   Alexis Goodell, MD Neurology [phone] 09/13/2016  11:39 AM

## 2016-09-13 NOTE — Progress Notes (Signed)
Patient ID: Isaiah [name], male   DOB: September 03, 1965, 51 y.o.   MRN: [id number]   [employer] PROGRESS NOTE  SAHID BORBA NIO:[id number] DOB: 06/21/1965 DOA: 09/17/2016 PCP: Patient, No Pcp Per  HPI/Subjective: Patient had episode earlier where his blood pressure dropped pulse became low and required atropine Still on 100% ventilator  Objective: Vitals:   09/13/16 1100 09/13/16 1200  BP: (!) 87/44 (!) 94/56  Pulse: (!) 121 (!) 142  Resp: (!) 24 (!) 27  Temp: 98.4 F (36.9 C) 98.4 F (36.9 C)    Filed Weights   09/11/16 0410 09/12/16 0300 09/13/16 0330  Weight: (!) 483 lb (219.1 kg) (!) 477 lb (216.4 kg) (!) 483 lb (219.1 kg)    ROS: Review of Systems  Unable to perform ROS: Critical illness   Exam: Physical Exam  HENT:  Nose: No mucosal edema.  Unable to look into mouth at this time  Eyes: Conjunctivae and lids are normal.  Pupils pinpoint  Neck: Carotid bruit is not present. No thyroid mass and no thyromegaly present.  Cardiovascular: S1 normal and S2 normal.  An irregularly irregular rhythm present. Tachycardia present.  Exam reveals distant heart sounds. Exam reveals no gallop.   No murmur heard. Pulses:      Dorsalis pedis pulses are 2+ on the right side, and 2+ on the left side.  Respiratory: No respiratory distress. He has decreased breath sounds in the right middle field, the right lower field, the left middle field and the left lower field. He has wheezes in the right middle field and the left middle field. He has rhonchi in the right lower field and the left lower field. He has no rales.  GI: Soft. Bowel sounds are normal. There is no tenderness.  Musculoskeletal:       Right knee: He exhibits swelling.       Left knee: He exhibits swelling.       Right ankle: He exhibits swelling.       Left ankle: He exhibits swelling.  Lymphadenopathy:    He has no cervical adenopathy.  Neurological: He is unresponsive.  Unresponsive to painful stimuli  Skin: No  rash noted. Nails show no clubbing.  Psychiatric:  Unable to assess at this time secondary to unresponsive      Data Reviewed: Basic Metabolic Panel:  Recent Labs Lab 09/12/16 1753 09/12/16 2004 09/13/16 0018 09/13/16 0503 09/13/16 0817 09/13/16 1237  NA 125* 133* 134* 135 134*  --   K 2.9* 4.0 4.0 3.9 3.9  --   CL 98* 99* 101 102 101  --   CO2 21* 24 27 27 25   --   GLUCOSE 325* 113* 127* 119* 114*  --   BUN 34* 42* 40* 40* 39*  --   CREATININE 2.27* 3.09* 2.91* 2.78* 2.65*  --   CALCIUM 6.4* 8.1* 8.4* 8.2* 8.2*  --   MG 13.5* 2.2 2.2 2.1 2.0 2.0  PHOS 2.7 3.6 3.4 3.4 3.6  --    Liver Function Tests:  Recent Labs Lab 09/12/2016 0750  09/11/16 0458  09/12/16 0359  09/12/16 1753 09/12/16 2004 09/13/16 0018 09/13/16 0503 09/13/16 0817  AST 102*  --  40  --  28  --   --   --   --   --   --   ALT 60  --  23  --  15*  --   --   --   --   --   --  ALKPHOS 93  --  72  --  53  --   --   --   --   --   --   BILITOT 1.4*  --  0.7  --  0.8  --   --   --   --   --   --   PROT 7.0  --  6.9  --  5.2*  --   --   --   --   --   --   ALBUMIN 3.1*  < > 2.8*  2.8*  < > 2.0*  < > 1.7* 2.3* 2.3* 2.2* 2.2*  < > = values in this interval not displayed. CBC:  Recent Labs Lab 09/16/2016 0750 09/08/16 0152 09/09/16 0431 09/11/16 0458 09/12/16 0359  WBC 15.4* 8.2 12.1* 12.8* 12.4*  NEUTROABS 12.6*  --   --   --   --   HGB 13.2 13.3 12.8* 11.5* 9.8*  HCT 40.7 41.3 39.8* 36.4* 31.0*  MCV 80.8 80.5 80.1 81.3 80.8  PLT 257 178 185 202 125*   Cardiac Enzymes:  Recent Labs Lab 09/25/2016 1632 09/08/16 0152 09/08/16 0532 09/13/16 0811 09/13/16 1237  TROPONINI 0.47* 0.21* 0.15* 0.05* 0.06*   BNP (last 3 results)  Recent Labs  09/14/2016 0750  BNP 508.0*     CBG:  Recent Labs Lab 09/12/16 1732 09/12/16 1954 09/13/16 0516 09/13/16 0717 09/13/16 1107  GLUCAP 284* 108* 129* 119* 122*    Recent Results (from the past 240 hour(s))  Culture, blood (routine x 2)      Status: None (Preliminary result)   Collection Time: 09/06/2016  8:18 AM  Result Value Ref Range Status   Specimen Description BLOOD L AC  Final   Special Requests   Final    BOTTLES DRAWN AEROBIC AND ANAEROBIC Blood Culture adequate volume   Culture NO GROWTH 4 DAYS  Final   Report Status PENDING  Incomplete  MRSA PCR Screening     Status: None   Collection Time: 09/15/2016  9:46 AM  Result Value Ref Range Status   MRSA by PCR NEGATIVE NEGATIVE Final    Comment:        The GeneXpert MRSA Assay (FDA approved for NASAL specimens only), is one component of a comprehensive MRSA colonization surveillance program. It is not intended to diagnose MRSA infection nor to guide or monitor treatment for MRSA infections.   Culture, blood (routine x 2)     Status: None (Preliminary result)   Collection Time: 09/14/2016 11:45 AM  Result Value Ref Range Status   Specimen Description BLOOD L HAND  Final   Special Requests   Final    BOTTLES DRAWN AEROBIC ONLY Blood Culture results may not be optimal due to an inadequate volume of blood received in culture bottles   Culture NO GROWTH 4 DAYS  Final   Report Status PENDING  Incomplete  Culture, respiratory (NON-Expectorated)     Status: None   Collection Time: 09/24/2016  6:32 PM  Result Value Ref Range Status   Specimen Description TRACHEAL ASPIRATE  Final   Special Requests NONE  Final   Gram Stain   Final    FEW WBC PRESENT, PREDOMINANTLY PMN MODERATE GRAM POSITIVE COCCI IN CHAINS IN PAIRS MODERATE GRAM NEGATIVE RODS    Culture   Final    Consistent with normal respiratory flora. Performed at [facility], [place] [address] [place], [place] [address]    Report Status 09/10/2016 FINAL  Final  Studies: Dg Chest 1 View  Result Date: 09/13/2016 CLINICAL DATA:  Routine am cxr for Anoxic-ischemic encephalopathy EXAM: CHEST 1 VIEW COMPARISON:  09/12/2016 FINDINGS: Right IJ central line tip overlies the level of superior vena cava.  Endotracheal tube is in place, 5.6 cm above the carina. Airspace filling opacity within the lungs bilaterally, increased on the left since the prior study. The heart is enlarged. IMPRESSION: Persistent bilateral airspace filling opacities, increased on the left. Electronically Signed   By: Nolon Nations M.D.   On: 09/13/2016 08:59   Dg Chest 1 View  Result Date: 09/12/2016 CLINICAL DATA:  Dyspnea EXAM: CHEST 1 VIEW COMPARISON:  09/11/2016 FINDINGS: Endotracheal tube remains in good position. Right jugular central venous catheter tip in the right atrium unchanged. Cardiac enlargement. Extensive diffuse bilateral airspace disease unchanged. Negative for pleural effusion IMPRESSION: Diffuse bilateral airspace disease unchanged. Endotracheal tube remains in good position. Electronically Signed   By: Franchot Gallo M.D.   On: 09/12/2016 07:18   Dg Chest Port 1 View  Result Date: 09/13/2016 CLINICAL DATA:  Acute respiratory failure. EXAM: PORTABLE CHEST 1 VIEW COMPARISON:  09/13/16 FINDINGS: The ET tube tip is above the carina. There is a right IJ catheter identified. The tip of which cannot be confidently identified. The heart size is enlarged. Large left pleural effusion is again noted and appears unchanged. There is significantly diminished aeration to the left lung, unchanged from previous exam. Diffuse right lung opacities are also unchanged. IMPRESSION: 1. No change in aeration a lungs compared with previous exam. 2. Cannot confirm location of tip of right IJ catheter. Electronically Signed   By: Kerby Moors M.D.   On: 09/13/2016 07:49   Dg Chest Port 1 View  Addendum Date: 09/12/2016   ADDENDUM REPORT: 09/12/2016 02:43 ADDENDUM: The patient's left PICC is noted ending about the distal SVC. The right IJ line is grossly unchanged in appearance, though not fully characterized. These results were called by telephone at the time of interpretation on 09/12/2016 at 2:42 am to [facility] at the [place], who verbally acknowledged these results. Electronically Signed   By: Garald Balding M.D.   On: 09/12/2016 02:43   Result Date: 09/12/2016 CLINICAL DATA:  Line placement. EXAM: PORTABLE CHEST 1 VIEW COMPARISON:  09/11/2016 FINDINGS: Right internal jugular approach central venous catheter tip is obscured by overlying soft tissues. Enteric catheter collimated off the image. Endotracheal tube in stable position. Left internal jugular approach venous catheter terminates at the expected location of the junction of the left internal jugular vein and left subclavian vein. Enlarged cardiac silhouette.  Mediastinal contours appear intact. Low lung volumes with interstitial pulmonary edema. No evidence of pneumothorax. Osseous structures are without acute abnormality. Soft tissues are grossly normal. IMPRESSION: Right internal jugular approach central venous catheter tip not visualized due to overlying soft tissues. The remaining of the supporting lines and tubes in stable position. Enlarged cardiac silhouette and pulmonary edema. Low lung volumes. Electronically Signed: By: Fidela Salisbury M.D. On: 09/11/2016 19:24   Dg Chest Port 1 View  Result Date: 09/11/2016 CLINICAL DATA:  Left PICC placement.  Initial encounter. EXAM: PORTABLE CHEST 1 VIEW COMPARISON:  Chest radiograph performed earlier today at 7:02 p.m. FINDINGS: The patient's left PICC is noted ending about the distal SVC. The endotracheal tube is seen ending 4-5 cm above the carina. A right IJ line is also noted ending about the distal SVC. The lungs are hypoexpanded. Vascular congestion is noted. Bilateral central airspace  opacification raises concern for pulmonary edema. No definite pleural effusion or pneumothorax is seen. The cardiomediastinal silhouette is mildly enlarged. No acute osseous abnormalities are seen. IMPRESSION: 1. Left PICC noted ending about the distal SVC. 2. Endotracheal tube seen ending 4-5 cm above the carina. 3.  Lungs hypoexpanded. Vascular congestion and mild cardiomegaly. Bilateral central airspace opacification raises concern for pulmonary edema. Electronically Signed   By: Garald Balding M.D.   On: 09/11/2016 22:08    Scheduled Meds: . budesonide (PULMICORT) nebulizer solution  0.5 mg Nebulization BID  . chlorhexidine gluconate (MEDLINE KIT)  15 mL Mouth Rinse BID  . docusate  100 mg Per Tube BID  . feeding supplement (PRO-STAT SUGAR FREE 64)  30 mL Per Tube Q4H  . insulin aspart  2-6 Units Subcutaneous Q4H  . insulin glargine  10 Units Subcutaneous Q24H  . ipratropium-albuterol  3 mL Nebulization Q6H  . mouth rinse  15 mL Mouth Rinse 10 times per day  . pantoprazole sodium  40 mg Per Tube Daily  . sennosides  5 mL Per Tube BID  . sodium chloride flush  10-40 mL Intracatheter Q12H  . sodium chloride flush  10-40 mL Intracatheter Q12H   Continuous Infusions: . sodium chloride 10 mL/hr at 09/13/16 1200  . amiodarone 30 mg/hr (09/13/16 1200)  . dexmedetomidine (PRECEDEX) IV infusion Stopped (09/13/16 0942)  . feeding supplement (VITAL HIGH PROTEIN) Stopped (09/12/16 0800)  . fentaNYL infusion INTRAVENOUS 75 mcg/hr (09/13/16 1200)  . heparin 1,850 Units/hr (09/13/16 1200)  . milrinone 0.25 mcg/kg/min (09/13/16 1200)  . norepinephrine (LEVOPHED) Adult infusion 64.96 mcg/min (09/13/16 1200)  . propofol (DIPRIVAN) infusion    . pureflow 3 each (09/13/16 0725)  . valproate sodium Stopped (09/13/16 1054)  . vasopressin (PITRESSIN) infusion - *FOR SHOCK* 0.03 Units/min (09/13/16 1200)    Assessment/Plan:  1. Cardiac arrest With multiorgan failure. Suspect due to arrhythmia related with an EF of 10% seen on echocardiogram.  Overall prognosis poor with anoxic encephalopathy. I updated the mother prognosis poor 2. 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 On milrinone.  With EF of 10% his overall prognosis is poor.  3. Acute kidney injury secondary to  cardiac arrest, CT scan with contrast. Continue CRRT 4. Anoxic encephalopathy with brain edema. Acute encephalopathy. Patient currently on sedation with fentanyl and versed. Previous EEG showing no seizures. Patient started on valproate sodium.  Neurology follow up.  Palliative care team following prognosis poor 5. Acute hypoxic respiratory failure. Patient has been on 100% FiO2 6. Hypotension- Continue Levophed  7. Type 2 diabetes mellitus. On sliding scale and low-dose Levemir. 8. Rapid Atrial fibrillation. Continue heparin drip. Patient on amiodarone drip and Cardizem 9. Morbid obesity and likely sleep apnea.  Code Status:     Code Status Orders        Start     Ordered   09/16/2016 1005  Full code  Continuous     09/18/2016 1007    Code Status History    Date Active Date Inactive Code Status Order ID Comments User Context   09/22/2016  8:30 AM 09/18/2016 10:08 AM Full Code [id number]  Loletha Grayer, MD ED     Family Communication: Discussed with mother  Consultants:  Critical care specialist  Cardiology  Neurology  Nephrology  Palliative care consultation  Time spent: 26 minutes  [place]

## 2016-09-13 NOTE — Progress Notes (Signed)
Dr Delford FieldWright informed during camera rounds of short runs VTach; no further orders at this time, pt hs Q4H BMets and serum Mag levels, Mag and K WDL, amiodarone infusing, nothing else to add at this time

## 2016-09-13 NOTE — Progress Notes (Signed)
UF titrated up to 35, write has now turned UF off, pt not meeting MAP goal of 65, current MAP is 49

## 2016-09-13 NOTE — Progress Notes (Signed)
CRRT restarted 1552 w/ new cartridge and new dialysate bags

## 2016-09-13 NOTE — Progress Notes (Signed)
CRRT paused at 1432 to change cartridge and dialysate bags, planned rinseback completed

## 2016-09-13 NOTE — Progress Notes (Addendum)
Per Dr Ronn Melenakolloru begin ultrafiltration at 25 ml/hr, titrate up to 50 ml/hr if tolerated.  CRRT MAP goal is 65

## 2016-09-13 NOTE — Progress Notes (Addendum)
Dr. Delford FieldWright checked in on patient and wanted to know if there were any changes. Informed MD that OG was put to intermittent suctioning and drew off 1650 ml. Blood pressure fluctuating, maxed on pressors, remains tachy, and alarm rang off VT once that did not look like VT. Continue to monitor.

## 2016-09-13 NOTE — Progress Notes (Signed)
[facility]  ROUNDING NOTE   Subjective:   Bradycardia and hypotension this morning. Given atropine and dopamine. Now on vasopressin, milrinone and norepinephrine.   CRRT UF 0 UOP 755 Parents are at bedside.    Objective:  Vital signs in last 24 hours:  Temp:  [98.2 F (36.8 C)-100 F (37.8 C)] 98.4 F (36.9 C) (06/17 0900) Pulse Rate:  [43-176] 71 (06/17 0900) Resp:  [18-44] 29 (06/17 0900) BP: (43-124)/(32-94) 108/79 (06/17 0900) SpO2:  [68 %-100 %] 92 % (06/17 0900) FiO2 (%):  [100 %] 100 % (06/17 0834) Weight:  [219.1 kg (483 lb)] 219.1 kg (483 lb) (06/17 0330)  Weight change: 2.722 kg (6 lb) Filed Weights   09/11/16 0410 09/12/16 0300 09/13/16 0330  Weight: (!) 219.1 kg (483 lb) (!) 216.4 kg (477 lb) (!) 219.1 kg (483 lb)    Intake/Output: I/O last 3 completed shifts: In: 5815.6 [I.V.:4635.6; NG/GT:840; IV Piggyback:340] Out: 47 [Urine:990]   Intake/Output this shift:  Total I/O In: 641.7 [I.V.:641.7] Out: 25 [Urine:25]  Physical Exam: General: Critically ill   Head: ETT  Eyes: Eyes closed  Neck: JVD not able to be examined due to obesity  Lungs:  Diminished, PRVC FiO2 100%  Heart: Irregular, tachycardia  Abdomen:  Soft, nontender, obese  Extremities: No peripheral edema.  Neurologic: Intubated, sedated  Skin: No lesions  Access: RIJ temp HD catheter 6/13 Dr. Ashby Dawes    Basic Metabolic Panel:  Recent Labs Lab 09/12/16 1611 09/12/16 1753 09/12/16 2004 09/13/16 0018 09/13/16 0503 09/13/16 0817  NA 121* 125* 133* 134* 135  --   K 3.2* 2.9* 4.0 4.0 3.9  --   CL 92* 98* 99* 101 102  --   CO2 22 21* _0 --   GLUCOSE 384* 325* 113* 127* 119*  --   BUN 37* 34* 42* 40* 40*  --   CREATININE 2.70* 2.27* 3.09* 2.91* 2.78*  --   CALCIUM 6.9* 6.4* 8.1* 8.4* 8.2*  --   MG 11.0* 13.5* 2.2 2.2 2.1 2.0  PHOS 2.8 2.7 3.6 3.4 3.4  --     Liver Function Tests:  Recent Labs Lab 09/26/2016 0750  09/11/16 0458  09/12/16 0359   09/12/16 1611 09/12/16 1753 09/12/16 2004 09/13/16 0018 09/13/16 0503  AST 102*  --  40  --  28  --   --   --   --   --   --   ALT 60  --  23  --  15*  --   --   --   --   --   --   ALKPHOS 93  --  72  --  53  --   --   --   --   --   --   BILITOT 1.4*  --  0.7  --  0.8  --   --   --   --   --   --   PROT 7.0  --  6.9  --  5.2*  --   --   --   --   --   --   ALBUMIN 3.1*  < > 2.8*  2.8*  < > 2.0*  < > 2.0* 1.7* 2.3* 2.3* 2.2*  < > = values in this interval not displayed. No results for input(s): LIPASE, AMYLASE in the last 168 hours. No results for input(s): AMMONIA in the last 168 hours.  CBC:  Recent Labs Lab 09/06/2016 0750 09/08/16 0152 09/09/16  3825 09/11/16 0458 09/12/16 0359  WBC 15.4* 8.2 12.1* 12.8* 12.4*  NEUTROABS 12.6*  --   --   --   --   HGB 13.2 13.3 12.8* 11.5* 9.8*  HCT 40.7 41.3 39.8* 36.4* 31.0*  MCV 80.8 80.5 80.1 81.3 80.8  PLT 257 178 185 202 125*    Cardiac Enzymes:  Recent Labs Lab 09/12/2016 1208 09/23/2016 1632 09/08/16 0152 09/08/16 0532 09/13/16 0811  TROPONINI 0.44* 0.47* 0.21* 0.15* 0.05*    BNP: Invalid input(s): POCBNP  CBG:  Recent Labs Lab 09/12/16 1549 09/12/16 1732 09/12/16 1954 09/13/16 0516 09/13/16 0717  GLUCAP 117* 284* 108* 36* 72*    Microbiology: Results for orders placed or performed during the hospital encounter of 09/17/2016  Culture, blood (routine x 2)     Status: None (Preliminary result)   Collection Time: 08/30/2016  8:18 AM  Result Value Ref Range Status   Specimen Description BLOOD L AC  Final   Special Requests   Final    BOTTLES DRAWN AEROBIC AND ANAEROBIC Blood Culture adequate volume   Culture NO GROWTH 4 DAYS  Final   Report Status PENDING  Incomplete  MRSA PCR Screening     Status: None   Collection Time: 09/09/2016  9:46 AM  Result Value Ref Range Status   MRSA by PCR NEGATIVE NEGATIVE Final    Comment:        The GeneXpert MRSA Assay (FDA approved for NASAL specimens only), is one  component of a comprehensive MRSA colonization surveillance program. It is not intended to diagnose MRSA infection nor to guide or monitor treatment for MRSA infections.   Culture, blood (routine x 2)     Status: None (Preliminary result)   Collection Time: 09/11/2016 11:45 AM  Result Value Ref Range Status   Specimen Description BLOOD L HAND  Final   Special Requests   Final    BOTTLES DRAWN AEROBIC ONLY Blood Culture results may not be optimal due to an inadequate volume of blood received in culture bottles   Culture NO GROWTH 4 DAYS  Final   Report Status PENDING  Incomplete  Culture, respiratory (NON-Expectorated)     Status: None   Collection Time: 09/22/2016  6:32 PM  Result Value Ref Range Status   Specimen Description TRACHEAL ASPIRATE  Final   Special Requests NONE  Final   Gram Stain   Final    FEW WBC PRESENT, PREDOMINANTLY PMN MODERATE GRAM POSITIVE COCCI IN CHAINS IN PAIRS MODERATE GRAM NEGATIVE RODS    Culture   Final    Consistent with normal respiratory flora. Performed at [facility], [place] [address], [place], [place] [address]    Report Status 09/10/2016 FINAL  Final    Coagulation Studies: No results for input(s): LABPROT, INR in the last 72 hours.  Urinalysis: No results for input(s): COLORURINE, LABSPEC, PHURINE, GLUCOSEU, HGBUR, BILIRUBINUR, KETONESUR, PROTEINUR, UROBILINOGEN, NITRITE, LEUKOCYTESUR in the last 72 hours.  Invalid input(s): APPERANCEUR    Imaging: Dg Chest 1 View  Result Date: 09/13/2016 CLINICAL DATA:  Routine am cxr for Anoxic-ischemic encephalopathy EXAM: CHEST 1 VIEW COMPARISON:  09/12/2016 FINDINGS: Right IJ central line tip overlies the level of superior vena cava. Endotracheal tube is in place, 5.6 cm above the carina. Airspace filling opacity within the lungs bilaterally, increased on the left since the prior study. The heart is enlarged. IMPRESSION: Persistent bilateral airspace filling opacities, increased on the left.  Electronically Signed   By: Nolon Nations M.D.  On: 09/13/2016 08:59   Dg Chest 1 View  Result Date: 09/12/2016 CLINICAL DATA:  Dyspnea EXAM: CHEST 1 VIEW COMPARISON:  09/11/2016 FINDINGS: Endotracheal tube remains in good position. Right jugular central venous catheter tip in the right atrium unchanged. Cardiac enlargement. Extensive diffuse bilateral airspace disease unchanged. Negative for pleural effusion IMPRESSION: Diffuse bilateral airspace disease unchanged. Endotracheal tube remains in good position. Electronically Signed   By: Franchot Gallo M.D.   On: 09/12/2016 07:18   Dg Chest Port 1 View  Result Date: 09/13/2016 CLINICAL DATA:  Acute respiratory failure. EXAM: PORTABLE CHEST 1 VIEW COMPARISON:  09/13/16 FINDINGS: The ET tube tip is above the carina. There is a right IJ catheter identified. The tip of which cannot be confidently identified. The heart size is enlarged. Large left pleural effusion is again noted and appears unchanged. There is significantly diminished aeration to the left lung, unchanged from previous exam. Diffuse right lung opacities are also unchanged. IMPRESSION: 1. No change in aeration a lungs compared with previous exam. 2. Cannot confirm location of tip of right IJ catheter. Electronically Signed   By: Kerby Moors M.D.   On: 09/13/2016 07:49   Dg Chest Port 1 View  Addendum Date: 09/12/2016   ADDENDUM REPORT: 09/12/2016 02:43 ADDENDUM: The patient's left PICC is noted ending about the distal SVC. The right IJ line is grossly unchanged in appearance, though not fully characterized. These results were called by telephone at the time of interpretation on 09/12/2016 at 2:42 am to [facility] at the [place], who verbally acknowledged these results. Electronically Signed   By: Garald Balding M.D.   On: 09/12/2016 02:43   Result Date: 09/12/2016 CLINICAL DATA:  Line placement. EXAM: PORTABLE CHEST 1 VIEW COMPARISON:  09/11/2016 FINDINGS: Right internal  jugular approach central venous catheter tip is obscured by overlying soft tissues. Enteric catheter collimated off the image. Endotracheal tube in stable position. Left internal jugular approach venous catheter terminates at the expected location of the junction of the left internal jugular vein and left subclavian vein. Enlarged cardiac silhouette.  Mediastinal contours appear intact. Low lung volumes with interstitial pulmonary edema. No evidence of pneumothorax. Osseous structures are without acute abnormality. Soft tissues are grossly normal. IMPRESSION: Right internal jugular approach central venous catheter tip not visualized due to overlying soft tissues. The remaining of the supporting lines and tubes in stable position. Enlarged cardiac silhouette and pulmonary edema. Low lung volumes. Electronically Signed: By: Fidela Salisbury M.D. On: 09/11/2016 19:24   Dg Chest Port 1 View  Result Date: 09/11/2016 CLINICAL DATA:  Left PICC placement.  Initial encounter. EXAM: PORTABLE CHEST 1 VIEW COMPARISON:  Chest radiograph performed earlier today at 7:02 p.m. FINDINGS: The patient's left PICC is noted ending about the distal SVC. The endotracheal tube is seen ending 4-5 cm above the carina. A right IJ line is also noted ending about the distal SVC. The lungs are hypoexpanded. Vascular congestion is noted. Bilateral central airspace opacification raises concern for pulmonary edema. No definite pleural effusion or pneumothorax is seen. The cardiomediastinal silhouette is mildly enlarged. No acute osseous abnormalities are seen. IMPRESSION: 1. Left PICC noted ending about the distal SVC. 2. Endotracheal tube seen ending 4-5 cm above the carina. 3. Lungs hypoexpanded. Vascular congestion and mild cardiomegaly. Bilateral central airspace opacification raises concern for pulmonary edema. Electronically Signed   By: Garald Balding M.D.   On: 09/11/2016 22:08     Medications:   . sodium chloride 10 mL/hr  at  09/13/16 0900  . amiodarone 30 mg/hr (09/13/16 0900)  . dexmedetomidine (PRECEDEX) IV infusion 0.8 mcg/kg/hr (09/13/16 0900)  . feeding supplement (VITAL HIGH PROTEIN) Stopped (09/12/16 0800)  . fentaNYL infusion INTRAVENOUS 75 mcg/hr (09/13/16 0900)  . heparin 1,850 Units/hr (09/13/16 0900)  . milrinone 0.25 mcg/kg/min (09/13/16 0900)  . norepinephrine (LEVOPHED) Adult infusion 64.96 mcg/min (09/13/16 0900)  . propofol (DIPRIVAN) infusion Stopped (09/11/16 0943)  . pureflow 3 each (09/13/16 0725)  . valproate sodium Stopped (09/13/16 0305)  . vasopressin (PITRESSIN) infusion - *FOR SHOCK* 0.03 Units/min (09/13/16 0900)   . budesonide (PULMICORT) nebulizer solution  0.5 mg Nebulization BID  . chlorhexidine gluconate (MEDLINE KIT)  15 mL Mouth Rinse BID  . diltiazem  60 mg Oral Q8H  . docusate  100 mg Oral BID  . feeding supplement (PRO-STAT SUGAR FREE 64)  30 mL Oral Q4H  . insulin aspart  2-6 Units Subcutaneous Q4H  . insulin glargine  10 Units Subcutaneous Q24H  . ipratropium-albuterol  3 mL Nebulization Q6H  . mouth rinse  15 mL Mouth Rinse 10 times per day  . pantoprazole sodium  40 mg Per Tube Daily  . sennosides  5 mL Oral BID  . sodium chloride flush  10-40 mL Intracatheter Q12H  . sodium chloride flush  10-40 mL Intracatheter Q12H   acetaminophen **OR** acetaminophen, fentaNYL, heparin, polyvinyl alcohol, sodium chloride flush, sodium chloride flush  Assessment/ Plan:  Isaiah [name] is a 51 y.o. black male with diabetes mellitus type II, hyeprtension, congestive heart failure, atrial fibrillation,  who was admitted to [facility] on 09/08/2016 for Cardiac arrest ([place]) [I46.9]  1. Acute renal failure: on chronic kidney disease stage III baseline creatinine of 1.4 04/10/16.  Nonoliguric On CRRT 4K bath. Start UF of 76m/hr - Monitor renal function, volume status, urine output and electrolytes. Low threshold to restart hemodialysis  2. Cardiogenic shock with hypotension: on  vasopressors: norepinephrine, vasopressors and milrinone - appreciate cards and critical care input  3. Respiratory Failure: intubated and sedate. PRVC FiO2 100%  Overall prognosis is poor. Discussed with patient's parents. Appreciate critical care and neuro input.   LOS: [place]6/17/20189:29 AM

## 2016-09-13 NOTE — Progress Notes (Signed)
Dr Ronn MelenaKolloru notified MAP 49. Ultrafiltration off.  Per him restart UF at 25 ml/hr and leave at 25 if pt tolerates.  If pt cannot tolerate reduce UF back to 0

## 2016-09-13 NOTE — Progress Notes (Signed)
[facility] Critical Care Medicine Consultation    SYNOPSIS   51 yo male with DM, afib,  cardiac arrest at home with Vfib arrest. Resuscitated, s/p cooling. ARF on CRRT, hypotensive on pressors, florid vol overload and pulm edema on 100% on vent.   ASSESSMENT/PLAN     PULMONARY A:Severe Acute hypoxic respiratory failure with Acute pulmonary edema secondary to cardiac arrest. Now on very high amount of support, Fo02 of 100% and peep=20.  Blood gas Reviewed, again Consistent with respiratory acidosis. Chest x-ray images 6/17 personally reviewed, continues to show diffuse pulmonary edema with cardiomegaly. P:   Continue high support levels.  Continue CRRT, currently on UF only.   VENTILATOR SETTINGS: Vent Mode: PRVC FiO2 (%):  [100 %] 100 % Set Rate:  [24 bmp] 24 bmp Vt Set:  [500 mL-550 mL] 550 mL PEEP:  [12 cmH20-20 cmH20] 20 cmH20 Plateau Pressure:  [30 cmH20-36 cmH20] 32 cmH20  CARDIOVASCULAR A: Cardiac and vfib arrest.  no evidence of acute MI, cardiology following Cardiomyopathy; Echo 09/10/16 EF=10% Atrial fibrillation, rate controlled. Cardiogenic shock with hypotension. Currently on high-level support with levofloxacin at 65 mics, vasopressin at 0.03, milrinone infusion. P:   Continues to require very high levels of support, will continue.   HEMODYNAMICS:    RENAL A:  Oliguric acute renal failure. P:   CRRT initiated 6/13; continue per nephrology. Currently on ultrafiltration Continue milrinone  INTAKE / OUTPUT:  Intake/Output Summary (Last 24 hours) at 09/13/16 0939 Last data filed at 09/13/16 0900  Gross per 24 hour  Intake          3736.81 ml  Output              675 ml  Net          3061.81 ml    GASTROINTESTINAL A:  -- HEMATOLOGIC A:  --  INFECTIOUS A:  -- P:    Micro/culture results:  BCx2 Negative to date UC -- Respiratory; 6/11: Normal respiratory flora.  Antibiotics: --  ENDOCRINE A:  DM , Blood glucose controlled P:    SSI  NEUROLOGIC A:  Acute anoxic encephalopathy;  Continued severe encephalopathy with minimal responses. P:   Continue current measures. Discussed with family. They would like to continue current level of care. Discussed with neurology, we would consider a nuclear perfusion scan to better prognosticate his neuro outlook given the family's difficulties in making decisions, however, currently the patient is too unstable to go for this test.  MAJOR EVENTS/TEST RESULTS: 06/11:Cardiac arrest, admitted, intubated, CVL placed 06/12: Minimal urine output, CVL retracted. 06/13; rewarming, possible seizures noted on decreasing sedation; CRRT initiated, CT head diffuse cerebral edema.  06/15; Goals of care discussion: continue current measures, full code.   Best Practices  DVT Prophylaxis: IV heparin GI Prophylaxis: PPI  --------------------------------------- ---------------------------------------   Name: Isaiah [name] MRN: [id number] DOB: 10-27-1965    ADMISSION DATE:  09/25/2016 Subjective.  The patient is sedated and on a ventilator.  REVIEW OF SYSTEMS:   History could not be obtained due to unresponsiveness.   VITAL SIGNS: Temp:  [98.2 F (36.8 C)-100 F (37.8 C)] 98.4 F (36.9 C) (06/17 0900) Pulse Rate:  [43-176] 71 (06/17 0900) Resp:  [18-44] 29 (06/17 0900) BP: (43-124)/(32-94) 108/79 (06/17 0900) SpO2:  [68 %-100 %] 92 % (06/17 0900) FiO2 (%):  [100 %] 100 % (06/17 0834) Weight:  [219.1 kg (483 lb)] 219.1 kg (483 lb) (06/17 0330) HEMODYNAMICS:   VENTILATOR SETTINGS: Vent Mode: PRVC FiO2 (%):  [  100 %] 100 % Set Rate:  [24 bmp] 24 bmp Vt Set:  [500 mL-550 mL] 550 mL PEEP:  [12 cmH20-20 cmH20] 20 cmH20 Plateau Pressure:  [30 cmH20-36 cmH20] 32 cmH20 INTAKE / OUTPUT:  Intake/Output Summary (Last 24 hours) at 09/13/16 0939 Last data filed at 09/13/16 0900  Gross per 24 hour  Intake          3736.81 ml  Output              675 ml  Net          3061.81 ml     Physical Examination:   VS: BP 108/79   Pulse 71   Temp 98.4 F (36.9 C)   Resp (!) 29   Ht 6' (1.829 m)   Wt (!) 219.1 kg (483 lb)   SpO2 92%   BMI 65.51 kg/m   General Appearance: No distress  Neuro:without focal findings,sedations. Continued Negative oculocephalic reflex, negative gag, HEENT:  EOM intact, no ptosis, no other lesions noticed;  Pulmonary: decreased air entry bilaterally.  CardiovascularNormal S1,S2.  No m/r/g.    Abdomen: Benign, Soft, non-tender, No masses, hepatosplenomegaly, No lymphadenopathy Renal:  No costovertebral tenderness  GU:  Not performed at this time. Endoc: No evident thyromegaly, no signs of acromegaly. Skin:   warm, no rashes, no ecchymosis  Extremities: normal, no cyanosis, clubbing, no edema, warm with normal capillary refill.    LABS: Reviewed   LABORATORY PANEL:   CBC  Recent Labs Lab 09/12/16 0359  WBC 12.4*  HGB 9.8*  HCT 31.0*  PLT 125*    Chemistries   Recent Labs Lab 09/12/16 0359  09/13/16 0503 09/13/16 0817  NA 124*  < > 135  --   K 2.8*  < > 3.9  --   CL 95*  < > 102  --   CO2 22  < > 27  --   GLUCOSE 428*  < > 119*  --   BUN 40*  < > 40*  --   CREATININE 2.95*  < > 2.78*  --   CALCIUM 6.6*  < > 8.2*  --   MG 1.4*  < > 2.1 2.0  PHOS 2.8  < > 3.4  --   AST 28  --   --   --   ALT 15*  --   --   --   ALKPHOS 53  --   --   --   BILITOT 0.8  --   --   --   < > = values in this interval not displayed.   Recent Labs Lab 09/12/16 1150 09/12/16 1549 09/12/16 1732 09/12/16 1954 09/13/16 0516 09/13/16 0717  GLUCAP 104* 117* 284* 108* 129* 119*    Recent Labs Lab 09/11/16 0553 09/12/16 0455 09/13/16 0500  PHART 7.22* 7.28* 7.29*  PCO2ART 71* 57* 57*  PO2ART 59* 82* 52*    Recent Labs Lab 09/17/2016 0750  09/11/16 0458  09/12/16 0359  09/12/16 2004 09/13/16 0018 09/13/16 0503  AST 102*  --  40  --  28  --   --   --   --   ALT 60  --  23  --  15*  --   --   --   --   ALKPHOS 93  --   72  --  53  --   --   --   --   BILITOT 1.4*  --  0.7  --  0.8  --   --   --   --  ALBUMIN 3.1*  < > 2.8*  2.8*  < > 2.0*  < > 2.3* 2.3* 2.2*  < > = values in this interval not displayed.  Cardiac Enzymes  Recent Labs Lab 09/13/16 0811  TROPONINI 0.05*    RADIOLOGY:       --Wells Guiles, MD.  Board Certified in Internal Medicine, Pulmonary Medicine, Critical Care Medicine, and Sleep Medicine.  ICU Pager [phone] [place] [facility] Office Number: [phone]  Santiago Glad, M.D.  Billy Fischer, M.D   09/13/2016, 9:39 AM  Critical Care Attestation.  I have personally obtained a history, examined the patient, evaluated laboratory and imaging results, formulated the assessment and plan and placed orders. The Patient requires high complexity decision making for assessment and support, frequent evaluation and titration of therapies, application of advanced monitoring technologies and extensive interpretation of multiple databases. The patient has critical illness that could lead imminently to failure of 1 or more organ systems and requires the highest level of physician preparedness to intervene.  Critical Care Time devoted to patient care services described in this note is 50 minutes and is exclusive of time spent in procedures supervisory time of NP.

## 2016-09-13 NOTE — Progress Notes (Signed)
Spoke with NP about what was discussed with Dr. Delford FieldWright. NP ordered amiodarone bolus, and neo gtt.

## 2016-09-13 NOTE — Progress Notes (Signed)
MEDICATION RELATED CONSULT NOTE - Follow Up  Pharmacy Consult for Electrolyte management  No Known Allergies  Patient Measurements: Height: 6' (182.9 cm) Weight: (!) 483 lb (219.1 kg) IBW/kg (Calculated) : 77.6   Vital Signs: Temp: 98.4 F (36.9 C) (06/17 0900) Temp Source: Core (Comment) (06/17 0630) BP: 108/79 (06/17 0900) Pulse Rate: 71 (06/17 0900) Intake/Output from previous day: 06/16 0701 - 06/17 0700 In: 3311.2 [I.V.:3031.2; NG/GT:60; IV Piggyback:220] Out: 720 [Urine:720] Intake/Output from this shift: Total I/O In: 641.7 [I.V.:641.7] Out: 25 [Urine:25]  Labs:  Recent Labs  09/10/16 1634  09/11/16 0034 09/11/16 0458  09/12/16 0359  09/13/16 0018 09/13/16 0503 09/13/16 0817  WBC  --   --   --  12.8*  --  12.4*  --   --   --   --   HGB  --   --   --  11.5*  --  9.8*  --   --   --   --   HCT  --   --   --  36.4*  --  31.0*  --   --   --   --   PLT  --   --   --  202  --  125*  --   --   --   --   APTT 63*  --  91*  --   --   --   --   --   --   --   CREATININE 3.37*  < > 3.26* 3.01*  3.22*  < > 2.95*  < > 2.91* 2.78* 2.65*  MG 1.9  < > 2.2 2.0  < > 1.4*  < > 2.2 2.1 2.0  PHOS 5.1*  < > 5.0* 5.0*  < > 2.8  < > 3.4 3.4 3.6  ALBUMIN 2.8*  < > 2.8* 2.8*  2.8*  < > 2.0*  < > 2.3* 2.2* 2.2*  PROT  --   --   --  6.9  --  5.2*  --   --   --   --   AST  --   --   --  40  --  28  --   --   --   --   ALT  --   --   --  23  --  15*  --   --   --   --   ALKPHOS  --   --   --  72  --  53  --   --   --   --   BILITOT  --   --   --  0.7  --  0.8  --   --   --   --   < > = values in this interval not displayed. Estimated Creatinine Clearance: 64.7 mL/min (A) (by C-G formula based on SCr of 2.65 mg/dL (H)).  Medical History: Past Medical History:  Diagnosis Date  . CHF (congestive heart failure) ([place])   . Diabetes mellitus without complication ([place])   . Hypertension     Medications:  Scheduled:  . budesonide (PULMICORT) nebulizer solution  0.5 mg Nebulization BID   . chlorhexidine gluconate (MEDLINE KIT)  15 mL Mouth Rinse BID  . diltiazem  60 mg Oral Q8H  . docusate  100 mg Oral BID  . feeding supplement (PRO-STAT SUGAR FREE 64)  30 mL Oral Q4H  . insulin aspart  2-6 Units Subcutaneous Q4H  . insulin glargine  10 Units Subcutaneous  Q24H  . ipratropium-albuterol  3 mL Nebulization Q6H  . mouth rinse  15 mL Mouth Rinse 10 times per day  . pantoprazole sodium  40 mg Per Tube Daily  . sennosides  5 mL Oral BID  . sodium chloride flush  10-40 mL Intracatheter Q12H  . sodium chloride flush  10-40 mL Intracatheter Q12H   Infusions:  . sodium chloride 10 mL/hr at 09/13/16 0900  . amiodarone 30 mg/hr (09/13/16 0900)  . dexmedetomidine (PRECEDEX) IV infusion Stopped (09/13/16 0942)  . feeding supplement (VITAL HIGH PROTEIN) Stopped (09/12/16 0800)  . fentaNYL infusion INTRAVENOUS 75 mcg/hr (09/13/16 0900)  . heparin 1,850 Units/hr (09/13/16 0900)  . milrinone 0.25 mcg/kg/min (09/13/16 0900)  . norepinephrine (LEVOPHED) Adult infusion 64.96 mcg/min (09/13/16 0900)  . propofol (DIPRIVAN) infusion Stopped (09/11/16 0943)  . pureflow 3 each (09/13/16 0725)  . valproate sodium 1,000 mg (09/13/16 0954)  . vasopressin (PITRESSIN) infusion - *FOR SHOCK* 0.03 Units/min (09/13/16 0900)   6/16 ~ 08:00  K = 3.9, Mag = 2.0.  No supplementation is needed at this time.  BMP is ordered daily. Mag is ordered every 4 hours.    Jenipher Havel K, RPH 09/13/2016,10:19 AM

## 2016-09-13 NOTE — Progress Notes (Signed)
ANTICOAGULATION CONSULT NOTE   Pharmacy Consult for heparin drip management  Indication: elevated tropinins   Pharmacy consulted for heparin drip management for 51 yo male admitted s/p vfib arrest. Patient had CPR x 3 rounds. Patient intubated and sedated; s/p targeted temperature management and now being initiated on CRRT. Patient takes apixaban as an outpatient currently has elevated anti-Xa level. Patient currently receiving heparin at 1850 units/hr.   6/17 at 08:17   HL = 0.34.    Goal:  APTT: 68-109 anti-Xa: 0.3-0.7 Monitor platelets per protocol  Plan:  Continue current drip rate of 1850 units/hr.   Next HL ordered with am labs on 6/18.  No Known Allergies  Patient Measurements: Height: 6' (182.9 cm) Weight: (!) 483 lb (219.1 kg) IBW/kg (Calculated) : 77.6 Heparin Dosing Weight: 129kg  Vital Signs: Temp: 98.4 F (36.9 C) (06/17 0900) Temp Source: Core (Comment) (06/17 0630) BP: 108/79 (06/17 0900) Pulse Rate: 71 (06/17 0900)  Labs:  Recent Labs  09/10/16 1634  09/11/16 0034 09/11/16 0458  09/12/16 0359  09/12/16 2004 09/13/16 0018 09/13/16 0503 09/13/16 0811 09/13/16 0817  HGB  --   --   --  11.5*  --  9.8*  --   --   --   --   --   --   HCT  --   --   --  36.4*  --  31.0*  --   --   --   --   --   --   PLT  --   --   --  202  --  125*  --   --   --   --   --   --   APTT 63*  --  91*  --   --   --   --   --   --   --   --   --   HEPARINUNFRC  --   --   --  0.58  < >  --   < > 0.34 0.30  --   --  0.34  CREATININE 3.37*  < > 3.26* 3.01*  3.22*  < > 2.95*  < > 3.09* 2.91* 2.78*  --  2.65*  TROPONINI  --   --   --   --   --   --   --   --   --   --  0.05*  --   < > = values in this interval not displayed.  Estimated Creatinine Clearance: 64.7 mL/min (A) (by C-G formula based on SCr of 2.65 mg/dL (H)).   Pharmacy will continue to monitor and adjust per consult.   Stormy CardKatsoudas,Suzane Vanderweide K, [facility] National Rehab CenterRPH Clinical Pharmacist 09/13/16 10:22 AM

## 2016-09-13 NOTE — Progress Notes (Addendum)
ultrafiltration at 25 tolerated at this time, pt observed with very weak cough/gag to suction, very weak but noticeable corneal reflex, pupils very sluggish.  Peripheral pulses +2, continuing CBGs d/t their agreement with serum glucose and WDL periph pulses.  precedex remains off,  Vasopressin at .03 and levophed at 65 mcgs.  Drawing labs from trialysis cath purple lumen.  Labs from PICC are deranged

## 2016-09-13 NOTE — Progress Notes (Signed)
MEDICATION RELATED CONSULT NOTE - Follow Up  Pharmacy Consult for Electrolyte management  No Known Allergies  Patient Measurements: Height: 6' (182.9 cm) Weight: (!) 483 lb (219.1 kg) IBW/kg (Calculated) : 77.6   Vital Signs: Temp: 98.4 F (36.9 C) (06/17 1200) Temp Source: Core (Comment) (06/17 0630) BP: 94/56 (06/17 1200) Pulse Rate: 142 (06/17 1200) Intake/Output from previous day: 06/16 0701 - 06/17 0700 In: 3311.2 [I.V.:3031.2; NG/GT:60; IV Piggyback:220] Out: 720 [Urine:720] Intake/Output from this shift: Total I/O In: 1160.5 [I.V.:1015.5; NG/GT:85; IV Piggyback:60] Out: 212 [Urine:155; Other:57]  Labs:  Recent Labs  09/10/16 1634  09/11/16 0034 09/11/16 0458  09/12/16 0359  09/13/16 0018 09/13/16 0503 09/13/16 0817 09/13/16 1237  WBC  --   --   --  12.8*  --  12.4*  --   --   --   --   --   HGB  --   --   --  11.5*  --  9.8*  --   --   --   --   --   HCT  --   --   --  36.4*  --  31.0*  --   --   --   --   --   PLT  --   --   --  202  --  125*  --   --   --   --   --   APTT 63*  --  91*  --   --   --   --   --   --   --   --   CREATININE 3.37*  < > 3.26* 3.01*  3.22*  < > 2.95*  < > 2.91* 2.78* 2.65*  --   MG 1.9  < > 2.2 2.0  < > 1.4*  < > 2.2 2.1 2.0 2.0  PHOS 5.1*  < > 5.0* 5.0*  < > 2.8  < > 3.4 3.4 3.6  --   ALBUMIN 2.8*  < > 2.8* 2.8*  2.8*  < > 2.0*  < > 2.3* 2.2* 2.2*  --   PROT  --   --   --  6.9  --  5.2*  --   --   --   --   --   AST  --   --   --  40  --  28  --   --   --   --   --   ALT  --   --   --  23  --  15*  --   --   --   --   --   ALKPHOS  --   --   --  72  --  53  --   --   --   --   --   BILITOT  --   --   --  0.7  --  0.8  --   --   --   --   --   < > = values in this interval not displayed. Estimated Creatinine Clearance: 64.7 mL/min (A) (by C-G formula based on SCr of 2.65 mg/dL (H)).  Medical History: Past Medical History:  Diagnosis Date  . CHF (congestive heart failure) ([place])   . Diabetes mellitus without complication  ([place])   . Hypertension     Medications:  Scheduled:  . budesonide (PULMICORT) nebulizer solution  0.5 mg Nebulization BID  . chlorhexidine gluconate (MEDLINE KIT)  15 mL Mouth Rinse BID  .  docusate  100 mg Per Tube BID  . feeding supplement (PRO-STAT SUGAR FREE 64)  30 mL Per Tube Q4H  . insulin aspart  2-6 Units Subcutaneous Q4H  . insulin glargine  10 Units Subcutaneous Q24H  . ipratropium-albuterol  3 mL Nebulization Q6H  . mouth rinse  15 mL Mouth Rinse 10 times per day  . pantoprazole sodium  40 mg Per Tube Daily  . sennosides  5 mL Per Tube BID  . sodium chloride flush  10-40 mL Intracatheter Q12H  . sodium chloride flush  10-40 mL Intracatheter Q12H   Infusions:  . sodium chloride 10 mL/hr at 09/13/16 1200  . amiodarone 30 mg/hr (09/13/16 1200)  . dexmedetomidine (PRECEDEX) IV infusion Stopped (09/13/16 0942)  . feeding supplement (VITAL HIGH PROTEIN) Stopped (09/12/16 0800)  . fentaNYL infusion INTRAVENOUS 75 mcg/hr (09/13/16 1200)  . heparin 1,850 Units/hr (09/13/16 1200)  . milrinone 0.25 mcg/kg/min (09/13/16 1200)  . norepinephrine (LEVOPHED) Adult infusion 64.96 mcg/min (09/13/16 1200)  . propofol (DIPRIVAN) infusion Stopped (09/11/16 0943)  . pureflow 3 each (09/13/16 0725)  . valproate sodium Stopped (09/13/16 1054)  . vasopressin (PITRESSIN) infusion - *FOR SHOCK* 0.03 Units/min (09/13/16 1200)   6/16 ~ 08:00  K = 3.9, Mag = 2.0.  No supplementation is needed at this time.  BMP is ordered daily. Mag is ordered every 4 hours.    Eryc Bodey K, [place] 09/13/2016,1:52 PM

## 2016-09-13 NOTE — Progress Notes (Signed)
Ultrafiltration off d/t patient not meeting MAP goal of 65

## 2016-09-13 NOTE — Progress Notes (Signed)
Per Dr Ardyth Manam discontinue cardizem tablet and change Oral meds to Per Tube

## 2016-09-13 NOTE — Progress Notes (Signed)
[facility] offered spiritual support, prayer, and spiritual presence.    09/13/16 0750  Clinical Encounter Type  Visited With Patient and family together  Visit Type Follow-up;Spiritual support  Referral From Nurse  Consult/Referral To Chaplain  Spiritual Encounters  Spiritual Needs Prayer

## 2016-09-13 NOTE — Progress Notes (Signed)
[facility] Critical Care Medicine Consultation    SYNOPSIS   Called to patient's bedside for  episode of bradycardia, hypotension and hypoxemia with SPO2 in the 70s.  ASSESSMENT/PLAN   Atropine 1mg  iv x 1 Dopamine Gtte NS BOLUS Resume levophed once available form pharmacy BMV ventilation for ~7 minutes with improvement in SPO2 STAT CXR, ABG and EKG Cycle cardiac enzymes Family notified    Name: Isaiah MountainJeffrey M Ratledge MRN: [id number] DOB: 02-26-1966    ADMISSION DATE:  06-15-16  VITAL SIGNS: Temp:  [96.8 F (36 C)-100 F (37.8 C)] 98.4 F (36.9 C) (06/17 0630) Pulse Rate:  [43-176] 56 (06/17 0630) Resp:  [18-44] 33 (06/17 0630) BP: (43-124)/(32-94) 43/32 (06/17 0630) SpO2:  [68 %-100 %] 68 % (06/17 0630) FiO2 (%):  [92 %-100 %] 100 % (06/17 0600) Weight:  [483 lb (219.1 kg)] 483 lb (219.1 kg) (06/17 0330) HEMODYNAMICS:   VENTILATOR SETTINGS: Vent Mode: PRVC FiO2 (%):  [92 %-100 %] 100 % Set Rate:  [24 bmp] 24 bmp Vt Set:  [500 mL] 500 mL PEEP:  [12 cmH20] 12 cmH20 Plateau Pressure:  [30 cmH20-36 cmH20] 30 cmH20 INTAKE / OUTPUT:  Intake/Output Summary (Last 24 hours) at 09/13/16 0656 Last data filed at 09/13/16 0600  Gross per 24 hour  Intake          3630.66 ml  Output              755 ml  Net          2875.66 ml    Physical Examination:   VS: BP (!) 43/32   Pulse (!) 56   Temp 98.4 F (36.9 C) (Core (Comment))   Resp (!) 33   Ht 6' (1.829 m)   Wt (!) 483 lb (219.1 kg)   SpO2 (!) 68%   BMI 65.51 kg/m   General Appearance: No distress  Neuro:without focal findings,sedations. Continued Negative oculocephalic reflex, negative gag, negative cough with deep suctioning. HEENT:  EOM intact, no ptosis, no other lesions noticed;  Pulmonary: decreased air entry bilaterally.  CardiovascularNormal S1,S2.  No m/r/g.    Abdomen: Benign, Soft, non-tender, No masses, hepatosplenomegaly, No lymphadenopathy Renal:  No costovertebral tenderness  GU:  Not performed at  this time. Endoc: No evident thyromegaly, no signs of acromegaly. Skin:   warm, no rashes, no ecchymosis  Extremities: normal, no cyanosis, clubbing, no edema, warm with normal capillary refill.    LABS: Reviewed   LABORATORY PANEL:   CBC  Recent Labs Lab 09/12/16 0359  WBC 12.4*  HGB 9.8*  HCT 31.0*  PLT 125*    Chemistries   Recent Labs Lab 09/12/16 0359  09/13/16 0503  NA 124*  < > 135  K 2.8*  < > 3.9  CL 95*  < > 102  CO2 22  < > 27  GLUCOSE 428*  < > 119*  BUN 40*  < > 40*  CREATININE 2.95*  < > 2.78*  CALCIUM 6.6*  < > 8.2*  MG 1.4*  < > 2.1  PHOS 2.8  < > 3.4  AST 28  --   --   ALT 15*  --   --   ALKPHOS 53  --   --   BILITOT 0.8  --   --   < > = values in this interval not displayed.   Recent Labs Lab 09/12/16 0716 09/12/16 1150 09/12/16 1549 09/12/16 1732 09/12/16 1954 09/13/16 0516  GLUCAP 104* 104* 117* 284* 108* 129*  Recent Labs Lab 09/11/16 0553 09/12/16 0455 09/13/16 0500  PHART 7.22* 7.28* 7.29*  PCO2ART 71* 57* 57*  PO2ART 59* 82* 52*    Recent Labs Lab 09-29-2016 0750  09/11/16 0458  09/12/16 0359  09/12/16 2004 09/13/16 0018 09/13/16 0503  AST 102*  --  40  --  28  --   --   --   --   ALT 60  --  23  --  15*  --   --   --   --   ALKPHOS 93  --  72  --  53  --   --   --   --   BILITOT 1.4*  --  0.7  --  0.8  --   --   --   --   ALBUMIN 3.1*  < > 2.8*  2.8*  < > 2.0*  < > 2.3* 2.3* 2.2*  < > = values in this interval not displayed.  Cardiac Enzymes  Recent Labs Lab 09/08/16 0532  TROPONINI 0.15*   [facility] ANP-BC Pulmonary and Critical Care Medicine [facility] Pager [phone] or [phone]

## 2016-09-13 NOTE — Progress Notes (Signed)
Per Dr Ardyth Manam order propofol for twitching, possible seizure activity, tachycardia

## 2016-09-14 ENCOUNTER — Inpatient Hospital Stay: Payer: BLUE CROSS/BLUE SHIELD

## 2016-09-14 DIAGNOSIS — J80 Acute respiratory distress syndrome: Secondary | ICD-10-CM

## 2016-09-14 DIAGNOSIS — R570 Cardiogenic shock: Secondary | ICD-10-CM

## 2016-09-14 LAB — MAGNESIUM
MAGNESIUM: 1.8 mg/dL (ref 1.7–2.4)
MAGNESIUM: 1.8 mg/dL (ref 1.7–2.4)
MAGNESIUM: 1.9 mg/dL (ref 1.7–2.4)
MAGNESIUM: 1.9 mg/dL (ref 1.7–2.4)
Magnesium: 1.9 mg/dL (ref 1.7–2.4)
Magnesium: 1.8 mg/dL (ref 1.7–2.4)

## 2016-09-14 LAB — RENAL FUNCTION PANEL
ALBUMIN: 2 g/dL — AB (ref 3.5–5.0)
ANION GAP: 5 (ref 5–15)
Albumin: 2.1 g/dL — ABNORMAL LOW (ref 3.5–5.0)
Albumin: 2 g/dL — ABNORMAL LOW (ref 3.5–5.0)
Albumin: 2.1 g/dL — ABNORMAL LOW (ref 3.5–5.0)
Albumin: 2 g/dL — ABNORMAL LOW (ref 3.5–5.0)
Anion gap: 4 — ABNORMAL LOW (ref 5–15)
Anion gap: 4 — ABNORMAL LOW (ref 5–15)
Anion gap: 5 (ref 5–15)
Anion gap: 6 (ref 5–15)
BUN: 33 mg/dL — ABNORMAL HIGH (ref 6–20)
BUN: 37 mg/dL — AB (ref 6–20)
BUN: 32 mg/dL — ABNORMAL HIGH (ref 6–20)
BUN: 31 mg/dL — ABNORMAL HIGH (ref 6–20)
BUN: 34 mg/dL — AB (ref 6–20)
CALCIUM: 8.2 mg/dL — AB (ref 8.9–10.3)
CHLORIDE: 101 mmol/L (ref 101–111)
CHLORIDE: 101 mmol/L (ref 101–111)
CHLORIDE: 100 mmol/L — AB (ref 101–111)
CO2: 26 mmol/L (ref 22–32)
CO2: 27 mmol/L (ref 22–32)
CO2: 27 mmol/L (ref 22–32)
CO2: 27 mmol/L (ref 22–32)
CO2: 28 mmol/L (ref 22–32)
CREATININE: 2.09 mg/dL — AB (ref 0.61–1.24)
CREATININE: 2.22 mg/dL — AB (ref 0.61–1.24)
CREATININE: 2.45 mg/dL — AB (ref 0.61–1.24)
Calcium: 8 mg/dL — ABNORMAL LOW (ref 8.9–10.3)
Calcium: 8.1 mg/dL — ABNORMAL LOW (ref 8.9–10.3)
Calcium: 8.1 mg/dL — ABNORMAL LOW (ref 8.9–10.3)
Calcium: 8.2 mg/dL — ABNORMAL LOW (ref 8.9–10.3)
Chloride: 101 mmol/L (ref 101–111)
Chloride: 99 mmol/L — ABNORMAL LOW (ref 101–111)
Creatinine, Ser: 2.29 mg/dL — ABNORMAL HIGH (ref 0.61–1.24)
Creatinine, Ser: 2.15 mg/dL — ABNORMAL HIGH (ref 0.61–1.24)
GFR calc Af Amer: 37 mL/min — ABNORMAL LOW (ref >60)
GFR calc Af Amer: 40 mL/min — ABNORMAL LOW (ref >60)
GFR calc Af Amer: 41 mL/min — ABNORMAL LOW (ref >60)
GFR calc non Af Amer: 32 mL/min — ABNORMAL LOW (ref >60)
GFR calc non Af Amer: 35 mL/min — ABNORMAL LOW (ref >60)
GFR, EST AFRICAN AMERICAN: 34 mL/min — AB (ref >60)
GFR, EST AFRICAN AMERICAN: 39 mL/min — AB (ref >60)
GFR, EST NON AFRICAN AMERICAN: 30 mL/min — AB (ref >60)
GFR, EST NON AFRICAN AMERICAN: 33 mL/min — AB (ref >60)
GFR, EST NON AFRICAN AMERICAN: 36 mL/min — AB (ref >60)
GLUCOSE: 157 mg/dL — AB (ref 65–99)
GLUCOSE: 146 mg/dL — AB (ref 65–99)
Glucose, Bld: 151 mg/dL — ABNORMAL HIGH (ref 65–99)
Glucose, Bld: 153 mg/dL — ABNORMAL HIGH (ref 65–99)
Glucose, Bld: 157 mg/dL — ABNORMAL HIGH (ref 65–99)
PHOSPHORUS: 2.5 mg/dL (ref 2.5–4.6)
PHOSPHORUS: 2.8 mg/dL (ref 2.5–4.6)
PHOSPHORUS: 3.3 mg/dL (ref 2.5–4.6)
POTASSIUM: 3.9 mmol/L (ref 3.5–5.1)
POTASSIUM: 4 mmol/L (ref 3.5–5.1)
POTASSIUM: 4.1 mmol/L (ref 3.5–5.1)
Phosphorus: 3 mg/dL (ref 2.5–4.6)
Phosphorus: 2.5 mg/dL (ref 2.5–4.6)
Potassium: 3.8 mmol/L (ref 3.5–5.1)
Potassium: 3.9 mmol/L (ref 3.5–5.1)
Sodium: 131 mmol/L — ABNORMAL LOW (ref 135–145)
Sodium: 132 mmol/L — ABNORMAL LOW (ref 135–145)
Sodium: 132 mmol/L — ABNORMAL LOW (ref 135–145)
Sodium: 133 mmol/L — ABNORMAL LOW (ref 135–145)
Sodium: 133 mmol/L — ABNORMAL LOW (ref 135–145)

## 2016-09-14 LAB — COMPREHENSIVE METABOLIC PANEL
ALT: 10 U/L — ABNORMAL LOW (ref 17–63)
AST: 29 U/L (ref 15–41)
Albumin: 2 g/dL — ABNORMAL LOW (ref 3.5–5.0)
Alkaline Phosphatase: 73 U/L (ref 38–126)
Anion gap: 5 (ref 5–15)
BUN: 35 mg/dL — AB (ref 6–20)
CHLORIDE: 101 mmol/L (ref 101–111)
CO2: 27 mmol/L (ref 22–32)
Calcium: 8.1 mg/dL — ABNORMAL LOW (ref 8.9–10.3)
Creatinine, Ser: 2.28 mg/dL — ABNORMAL HIGH (ref 0.61–1.24)
GFR, EST AFRICAN AMERICAN: 37 mL/min — AB (ref >60)
GFR, EST NON AFRICAN AMERICAN: 32 mL/min — AB (ref >60)
Glucose, Bld: 160 mg/dL — ABNORMAL HIGH (ref 65–99)
POTASSIUM: 4.1 mmol/L (ref 3.5–5.1)
Sodium: 133 mmol/L — ABNORMAL LOW (ref 135–145)
Total Bilirubin: 1.9 mg/dL — ABNORMAL HIGH (ref 0.3–1.2)
Total Protein: 6.2 g/dL — ABNORMAL LOW (ref 6.5–8.1)

## 2016-09-14 LAB — GLUCOSE, CAPILLARY
GLUCOSE-CAPILLARY: 139 mg/dL — AB (ref 65–99)
GLUCOSE-CAPILLARY: 142 mg/dL — AB (ref 65–99)
GLUCOSE-CAPILLARY: 133 mg/dL — AB (ref 65–99)
Glucose-Capillary: 174 mg/dL — ABNORMAL HIGH (ref 65–99)
Glucose-Capillary: 112 mg/dL — ABNORMAL HIGH (ref 65–99)
Glucose-Capillary: 146 mg/dL — ABNORMAL HIGH (ref 65–99)
Glucose-Capillary: 144 mg/dL — ABNORMAL HIGH (ref 65–99)

## 2016-09-14 LAB — BLOOD GAS, ARTERIAL
Acid-base deficit: 1.2 mmol/L (ref 0.0–2.0)
BICARBONATE: 26.8 mmol/L (ref 20.0–28.0)
FIO2: 1
LHR: 24 {breaths}/min
Mechanical Rate: 24
O2 SAT: 86.5 %
PATIENT TEMPERATURE: 37
PCO2 ART: 61 mmHg — AB (ref 32.0–48.0)
PEEP: 20 cmH2O
PH ART: 7.25 — AB (ref 7.350–7.450)
VT: 550 mL
pO2, Arterial: 61 mmHg — ABNORMAL LOW (ref 83.0–108.0)

## 2016-09-14 LAB — CBC
HCT: 32.5 % — ABNORMAL LOW (ref 40.0–52.0)
Hemoglobin: 10.4 g/dL — ABNORMAL LOW (ref 13.0–18.0)
MCH: 25.5 pg — AB (ref 26.0–34.0)
MCHC: 32.2 g/dL (ref 32.0–36.0)
MCV: 79.4 fL — ABNORMAL LOW (ref 80.0–100.0)
PLATELETS: 139 10*3/uL — AB (ref 150–440)
RBC: 4.09 MIL/uL — AB (ref 4.40–5.90)
RDW: 17.8 % — ABNORMAL HIGH (ref 11.5–14.5)
WBC: 17.6 10*3/uL — ABNORMAL HIGH (ref 3.8–10.6)

## 2016-09-14 LAB — HEPARIN LEVEL (UNFRACTIONATED): HEPARIN UNFRACTIONATED: 0.35 [IU]/mL (ref 0.30–0.70)

## 2016-09-14 LAB — PHOSPHORUS: Phosphorus: 3.1 mg/dL (ref 2.5–4.6)

## 2016-09-14 MED ORDER — HEPARIN SODIUM (PORCINE) 5000 UNIT/ML IJ SOLN: 5000.0000 [IU] | Freq: Three times a day (TID) | INTRAMUSCULAR | Status: DC

## 2016-09-14 MED ORDER — HEPARIN SODIUM (PORCINE) 5000 UNIT/ML IJ SOLN
5000.0000 [IU] | Freq: Three times a day (TID) | INTRAMUSCULAR | Status: DC
  Administered 2016-09-14 – 2016-09-18 (×12): 5000 [IU] via SUBCUTANEOUS
  Filled 2016-09-14 (×13): qty 1

## 2016-09-14 MED ORDER — AMIODARONE IV BOLUS ONLY 150 MG/100ML
150.0000 mg | Freq: Once | INTRAVENOUS | Status: AC
  Administered 2016-09-14: 150 mg via INTRAVENOUS
  Filled 2016-09-14: qty 100

## 2016-09-14 MED ORDER — SODIUM CHLORIDE 0.9 % IV SOLN
0.0000 ug/min | INTRAVENOUS | Status: DC
  Administered 2016-09-14: 40 ug/min via INTRAVENOUS
  Administered 2016-09-14: 220 ug/min via INTRAVENOUS
  Administered 2016-09-14: 400 ug/min via INTRAVENOUS
  Administered 2016-09-14: 350 ug/min via INTRAVENOUS
  Administered 2016-09-14 (×2): 390 ug/min via INTRAVENOUS
  Administered 2016-09-14: 400 ug/min via INTRAVENOUS
  Administered 2016-09-14: 200 ug/min via INTRAVENOUS
  Administered 2016-09-15: 320 ug/min via INTRAVENOUS
  Administered 2016-09-15: 330 ug/min via INTRAVENOUS
  Administered 2016-09-15: 360 ug/min via INTRAVENOUS
  Administered 2016-09-15: 350 ug/min via INTRAVENOUS
  Administered 2016-09-15 (×5): 360 ug/min via INTRAVENOUS
  Administered 2016-09-16: 250 ug/min via INTRAVENOUS
  Administered 2016-09-16: 190 ug/min via INTRAVENOUS
  Administered 2016-09-16: 300 ug/min via INTRAVENOUS
  Administered 2016-09-16: 330 ug/min via INTRAVENOUS
  Administered 2016-09-16: 260 ug/min via INTRAVENOUS
  Administered 2016-09-16: 200 ug/min via INTRAVENOUS
  Administered 2016-09-16: 225 ug/min via INTRAVENOUS
  Administered 2016-09-17: 350 ug/min via INTRAVENOUS
  Administered 2016-09-17: 380 ug/min via INTRAVENOUS
  Administered 2016-09-17: 360 ug/min via INTRAVENOUS
  Administered 2016-09-17: 350 ug/min via INTRAVENOUS
  Administered 2016-09-17: 375 ug/min via INTRAVENOUS
  Administered 2016-09-17 (×2): 350 ug/min via INTRAVENOUS
  Administered 2016-09-17: 270 ug/min via INTRAVENOUS
  Administered 2016-09-17: 290 ug/min via INTRAVENOUS
  Administered 2016-09-18: 370 ug/min via INTRAVENOUS
  Administered 2016-09-18 (×2): 360 ug/min via INTRAVENOUS
  Administered 2016-09-18: 370 ug/min via INTRAVENOUS
  Administered 2016-09-18: 400 ug/min via INTRAVENOUS
  Administered 2016-09-18: 360 ug/min via INTRAVENOUS
  Administered 2016-09-18: 340 ug/min via INTRAVENOUS
  Administered 2016-09-18: 360 ug/min via INTRAVENOUS
  Administered 2016-09-19 (×9): 400 ug/min via INTRAVENOUS
  Filled 2016-09-14 (×3): qty 4
  Filled 2016-09-14: qty 40
  Filled 2016-09-14 (×6): qty 4
  Filled 2016-09-14 (×2): qty 40
  Filled 2016-09-14 (×10): qty 4
  Filled 2016-09-14 (×2): qty 40
  Filled 2016-09-14 (×2): qty 4
  Filled 2016-09-14: qty 40
  Filled 2016-09-14 (×10): qty 4
  Filled 2016-09-14 (×2): qty 40
  Filled 2016-09-14 (×7): qty 4
  Filled 2016-09-14: qty 40
  Filled 2016-09-14 (×9): qty 4
  Filled 2016-09-14: qty 40
  Filled 2016-09-14 (×5): qty 4
  Filled 2016-09-14 (×2): qty 40
  Filled 2016-09-14 (×3): qty 4

## 2016-09-14 MED ORDER — ALBUMIN HUMAN 25 % IV SOLN
12.5000 g | Freq: Two times a day (BID) | INTRAVENOUS | Status: DC
  Administered 2016-09-14 – 2016-09-19 (×11): 12.5 g via INTRAVENOUS
  Filled 2016-09-14 (×13): qty 50

## 2016-09-14 NOTE — Progress Notes (Signed)
ANTICOAGULATION CONSULT NOTE   Pharmacy Consult for heparin drip management  Indication: elevated tropinins   Pharmacy consulted for heparin drip management for 51 yo male admitted s/p vfib arrest. Patient had CPR x 3 rounds. Patient intubated and sedated; s/p targeted temperature management and now being initiated on CRRT. Patient takes apixaban as an outpatient currently has elevated anti-Xa level. Patient currently receiving heparin at 1850 units/hr.   6/17 at 08:17   HL = 0.34.    Goal:  APTT: 68-109 anti-Xa: 0.3-0.7 Monitor platelets per protocol  Plan:  Continue current drip rate of 1850 units/hr.   Next HL ordered with am labs on 6/18.  06/18 AM heparin level 0.35. Continue current regimen. Recheck heparin level and CBC with tomorrow AM labs.  No Known Allergies  Patient Measurements: Height: 6' (182.9 cm) Weight: (!) 487 lb (220.9 kg) IBW/kg (Calculated) : 77.6 Heparin Dosing Weight: 129kg  Vital Signs: Temp: 97.9 F (36.6 C) (06/18 0545) Temp Source: Core (Comment) (06/17 1900) BP: 102/68 (06/18 0545) Pulse Rate: 126 (06/18 0545)  Labs:  Recent Labs  09/12/16 0359  09/13/16 0018  09/13/16 [id number] 09/13/16 0817 09/13/16 1237  09/13/16 1815 09/13/16 2033 09/14/16 0026 09/14/16 0513  HGB 9.8*  --   --   --   --   --   --   --   --   --   --  10.4*  HCT 31.0*  --   --   --   --   --   --   --   --   --   --  32.5*  PLT 125*  --   --   --   --   --   --   --   --   --   --  139*  HEPARINUNFRC  --   < > 0.30  --   --  0.34  --   --   --   --   --  0.35  CREATININE 2.95*  < > 2.91*  < >  --  2.65* 2.54*  < >  --  2.54* 2.45* 2.28*  TROPONINI  --   --   --   --  0.05*  --  0.06*  --  0.05*  --   --   --   < > = values in this interval not displayed.  Estimated Creatinine Clearance: 75.6 mL/min (A) (by C-G formula based on SCr of 2.28 mg/dL (H)).   Pharmacy will continue to monitor and adjust per consult.   Erich MontaneMcBane,Delfina Schreurs S, [facility] HospitalRPH Clinical  Pharmacist 09/14/16 6:11 AM

## 2016-09-14 NOTE — Progress Notes (Signed)
CH made a follow up visit. Pt remains intubated and unable to respond. CH visited with Mother and Father in IC hallway. Parents stated that the younger brother is still having a difficult time as Pt is older brother and best friend. Pt has just received a DNR and family is having a difficult time adjusting. Parents state they have a good support system, but are worried about the younger brother. Parents appreciate the spiritual support provided by the various CH's this week. CH is available for follow up as needed.    09/14/16 1500  Clinical Encounter Type  Visited With Patient;Family;Health care provider  Visit Type Follow-up;Spiritual support;Critical Care  Referral From Nurse  Consult/Referral To Chaplain  Spiritual Encounters  Spiritual Needs Prayer;Emotional

## 2016-09-14 NOTE — Progress Notes (Signed)
PULMONARY / CRITICAL CARE MEDICINE   Name: Isaiah [name] MRN: [id number] DOB: 03-21-1966    ADMISSION DATE:  September 29, 2016  PT PROFILE: 51 yo male with DM, CAF, morbid obesity, cardiomyopathy suffered out of hospital cardiac arrest requiring prolonged CPR/ACLS. Underwent hypothermia protocol. Hospitalization complicated by MODS.  MAJOR EVENTS/TEST RESULTS: 06/11 Admission after out of hospital cardiac arrest 06/11 CTA chest: No PE noted on suboptimal study 06/11 CT head: No acute abnormalities 06/11 EEG: abnormal EEG due to a burst suppression rhythm 06/11 neurology consultation 06/12 anuric renal failure, shock requiring high dose vasopressors 06/12 echocardiogram: LVEF 10% 06/13 Rewarmed. Possible seizure. CRRT initiated 06/13 CT head: cerebral edema 06/13 EEG: This is an abnormal electroencephalogram due the presence of PLEDs that are triphasic in morphology.  Although most prevalent over the left hemisphere, this activity can be noted over the right hemisphere independently and also have a more generalized distribution 06/13 per neurology consultant, prognosis for neurologic recovery deemed poor 06/14 palliative care consultation 06/15 progressive pulmonary infiltrates c/w edema vs ARDS 06/15 goals of care discussion with family. Full code desired 06/15 EEG: This is an abnormal electroencephalogram secondary to the absence of cerebral activity 06/16 worsening oxygenation. Worsening pulmonary infiltrates. Remains on high dose vasopressors. Remains on CRRT. Remains minimally responsive 06/17 worsening pulmonary infiltrates and left pleural effusion. Remains on CRRT. Remains on high-dose vasopressors. Remains minimally responsive 06/18 severe ARDS pattern on x-ray requiring high FiO2/PEEP. Remains on CRRT. On maximum dose vasopressors. Remains minimally responsive but with some spontaneous ventilatory effort 06/18 mother and father updated at bedside. Goals of care again addressed. They  were informed that he is on maximum support. Made DNR in event of cardiac arrest. To consider discontinuation of life-sustaining therapies later in the week   INDWELLING DEVICES:: ETT 06/11 >>  L IJ CVL 06/11 >> 06/15 R IJ HD cath 06/13 >>  RUE PICC 06/15 >>    MICRO DATA: MRSA PCR 06/11 >> NEG Resp 06/11 >> consistent with oral flora Blood 06/11 >> NEG  ANTIMICROBIALS:  Ceftriaxone 06/11 X 1 dose    SUBJECTIVE:  RASS -5. Requiring fentanyl to maintain patient-ventilator synchrony. Unresponsive to painful stimuli.  VITAL SIGNS: BP 121/72   Pulse (!) 143   Temp 97.7 F (36.5 C)   Resp 18   Ht 6' (1.829 m)   Wt (!) 487 lb (220.9 kg)   SpO2 98%   BMI 66.05 kg/m   HEMODYNAMICS:    VENTILATOR SETTINGS: Vent Mode: PRVC FiO2 (%):  [100 %] 100 % Set Rate:  [24 bmp] 24 bmp Vt Set:  [550 mL] 550 mL PEEP:  [20 cmH20] 20 cmH20 Plateau Pressure:  [30 cmH20-39 cmH20] 39 cmH20  INTAKE / OUTPUT: I/O last 3 completed shifts: In: 6224.8 [I.V.:5959.8; NG/GT:85; IV Piggyback:180] Out: 2763 [Urine:855; Emesis/NG output:1750; Other:158]  PHYSICAL EXAMINATION: General: Morbidly obese, fentanyl infusion, RASS -5  Neuro: Spontaneous ventilatory effort, no spontaneous movement, no response to painful stimuli HEENT: NCAT, scleral edema Cardiovascular: Distant heart sounds, IR IR, no murmurs noted Lungs: Very coarse bilateral breath sounds without true wheezes Abdomen: Severely obese, Ext: cool, diminished to absent pulses, symmetric edema  LABS:  BMET  Recent Labs Lab 09/14/16 0026 09/14/16 0513 09/14/16 0846  NA 131* 133* 132*  K 4.0 4.1 4.1  CL 99* 101 101  CO2 26 27 27   BUN 37* 35* 34*  CREATININE 2.45* 2.28* 2.29*  GLUCOSE 157* 160* 157*    Electrolytes  Recent Labs Lab 09/14/16 0026 09/14/16 8657  09/14/16 0846  CALCIUM 8.0* 8.1* 8.2*  MG 1.8 1.9 1.9  PHOS 3.3 3.1 3.0    CBC  Recent Labs Lab 09/11/16 0458 09/12/16 0359 09/14/16 0513  WBC 12.8*  12.4* 17.6*  HGB 11.5* 9.8* 10.4*  HCT 36.4* 31.0* 32.5*  PLT 202 125* 139*    Coag's  Recent Labs Lab 09/14/2016 1208 09/21/2016 1830  09/10/16 0922 09/10/16 1634 09/11/16 0034  APTT 29 85*  < > 59* 63* 91*  INR 1.36 1.39  --   --   --   --   < > = values in this interval not displayed.  Sepsis Markers  Recent Labs Lab 09/25/2016 1208  LATICACIDVEN 1.5    ABG  Recent Labs Lab 09/13/16 0500 09/13/16 0650 09/14/16 0400  PHART 7.29* 7.22* 7.25*  PCO2ART 57* 63* 61*  PO2ART 52* UNABLE TO CALCULATE. 61*    Liver Enzymes  Recent Labs Lab 09/11/16 0458  09/12/16 0359  09/14/16 0026 09/14/16 0513 09/14/16 0846  AST 40  --  28  --   --  29  --   ALT 23  --  15*  --   --  10*  --   ALKPHOS 72  --  53  --   --  73  --   BILITOT 0.7  --  0.8  --   --  1.9*  --   ALBUMIN 2.8*  2.8*  < > 2.0*  < > 2.0* 2.0* 2.1*  < > = values in this interval not displayed.  Cardiac Enzymes  Recent Labs Lab 09/13/16 0811 09/13/16 1237 09/13/16 1815  TROPONINI 0.05* 0.06* 0.05*    Glucose  Recent Labs Lab 09/13/16 1107 09/13/16 1639 09/13/16 2003 09/13/16 2345 09/14/16 0426 09/14/16 0740  GLUCAP 122* 133* 122* 151* 139* 174*    CXR: Severe left greater than right bilateral airspace disease consistent with edema versus ARDS   ASSESSMENT / PLAN:  PULMONARY A: Acute hypoxemic respiratory failure VDRF post cardiac arrest Severe ARDS versus pulmonary edema P:   Cont full vent support - settings reviewed and/or adjusted Cont vent bundle Daily SBT if/when meets criteria   CARDIOVASCULAR A:  Cardiac arrest Severe cardiomyopathy Atrial fibrillation Cardiogenic shock P:  Continue vasopressors - he is on maximum dose of all vasopressors and nothing further will be added Continue amiodarone infusion DNR in event of recurrent arrest (see below)  RENAL A:   Anuric renal failure post cardiac arrest P:   Monitor BMET intermittently Monitor I/Os Correct  electrolytes as indicated Continue CRRT for now  GASTROINTESTINAL A:   Morbid obesity Gastric ileus P:   SUP: Enteral PPI Holding TF's  HEMATOLOGIC A:   Mild ICU acquired anemia without acute blood loss Mild thrombocytopenia P:  DVT px: SQ heparin Monitor CBC intermittently Transfuse per usual guidelines   INFECTIOUS A:   No definite infection identified P:   Monitor temp, WBC count Micro and abx as above   ENDOCRINE A:   Type II DM P:   Lantus SSI  NEUROLOGIC A:   Anoxic encephalopathy Cerebral edema  Very poor prognosis P:   RASS goal: -1, -2 Maintaining fentanyl infusion for ventilator synchrony Minimize sedatives   FAMILY: I updated the patient's mother and father in the conference room. I explained to them that we are "maxed out" on all therapies. I related that his prognosis for a functional recovery is essentially nil. They understood this and seemed to accept this difficult reality. We have made him  DNR in event of cardiac arrest. As the week progresses, they will consider withdrawal of life-sustaining therapies.   CCM time: 60 mins The above time includes time spent in consultation with patient and/or family members and reviewing care plan on multidisciplinary rounds  Billy Fischeravid Dontee Jaso, MD PCCM service Mobile [phone] Pager [phone]  09/14/2016, 11:12 AM

## 2016-09-14 NOTE — Procedures (Deleted)
PROCEDURE NOTE: CVL PLACEMENT  INDICATION:    Monitoring of central venous pressures and/or administration of medications optimally administered in central vein  CONSENT:   Performed as emergent procedure  PROCEDURE  Sterile technique was used including antiseptics, cap, gloves, gown, hand hygiene, mask and full body sheet.  Skin prep: Chlorhexidine; local anesthetic administered  A triple lumen catheter was placed in the R [place] vein using the Seldinger technique.    EVALUATION:  Blood flow good  Complications: No apparent complications  Patient tolerated the procedure well.  Chest X-ray ordered to verify placement and is pending   Billy Fischeravid Birgit Nowling, MD PCCM service Mobile [phone] Pager [phone] 09/14/2016 5:53 PM

## 2016-09-14 NOTE — Progress Notes (Addendum)
MEDICATION RELATED CONSULT NOTE - Follow Up  Pharmacy Consult for Electrolyte management  No Known Allergies  Patient Measurements: Height: 6' (182.9 cm) Weight: (!) 483 lb (219.1 kg) IBW/kg (Calculated) : 77.6   Vital Signs: Temp: 98.4 F (36.9 C) (06/17 2345) Temp Source: Core (Comment) (06/17 1900) BP: 109/76 (06/17 2345) Pulse Rate: 90 (06/17 2345) Intake/Output from previous day: 06/17 0701 - 06/18 0700 In: 3151.4 [I.V.:2946.4; NG/GT:85; IV Piggyback:120] Out: 2203 [Urine:395; Emesis/NG output:1650] Intake/Output from this shift: Total I/O In: 799.7 [I.V.:799.7] Out: 1800 [Urine:150; Emesis/NG output:1650]  Labs:  Recent Labs  09/11/16 0034 09/11/16 0458  09/12/16 0359  09/13/16 1237 09/13/16 1641 09/13/16 2033  WBC  --  12.8*  --  12.4*  --   --   --   --   HGB  --  11.5*  --  9.8*  --   --   --   --   HCT  --  36.4*  --  31.0*  --   --   --   --   PLT  --  202  --  125*  --   --   --   --   APTT 91*  --   --   --   --   --   --   --   CREATININE 3.26* 3.01*  3.22*  < > 2.95*  < > 2.54* 2.51* 2.54*  MG 2.2 2.0  < > 1.4*  < > 2.0 2.0 2.0  PHOS 5.0* 5.0*  < > 2.8  < > 3.7 3.3 3.6  ALBUMIN 2.8* 2.8*  2.8*  < > 2.0*  < > 2.2* 2.1* 2.1*  PROT  --  6.9  --  5.2*  --   --   --   --   AST  --  40  --  28  --   --   --   --   ALT  --  23  --  15*  --   --   --   --   ALKPHOS  --  72  --  53  --   --   --   --   BILITOT  --  0.7  --  0.8  --   --   --   --   < > = values in this interval not displayed. Estimated Creatinine Clearance: 67.5 mL/min (A) (by C-G formula based on SCr of 2.54 mg/dL (H)).  Medical History: Past Medical History:  Diagnosis Date  . CHF (congestive heart failure) ([place])   . Diabetes mellitus without complication ([place])   . Hypertension     Medications:  Scheduled:  . budesonide (PULMICORT) nebulizer solution  0.5 mg Nebulization BID  . chlorhexidine gluconate (MEDLINE KIT)  15 mL Mouth Rinse BID  . docusate  100 mg Per Tube BID  .  feeding supplement (PRO-STAT SUGAR FREE 64)  30 mL Per Tube Q4H  . insulin aspart  2-6 Units Subcutaneous Q4H  . insulin glargine  10 Units Subcutaneous Q24H  . ipratropium-albuterol  3 mL Nebulization Q6H  . mouth rinse  15 mL Mouth Rinse 10 times per day  . pantoprazole sodium  40 mg Per Tube Daily  . sennosides  5 mL Per Tube BID  . sodium chloride flush  10-40 mL Intracatheter Q12H  . sodium chloride flush  10-40 mL Intracatheter Q12H   Infusions:  . sodium chloride 10 mL/hr at 09/13/16 1900  . amiodarone 30 mg/hr (09/13/16  1900)  . dexmedetomidine (PRECEDEX) IV infusion Stopped (09/13/16 0942)  . feeding supplement (VITAL HIGH PROTEIN) Stopped (09/12/16 0800)  . fentaNYL infusion INTRAVENOUS 75 mcg/hr (09/13/16 1900)  . heparin 1,850 Units/hr (09/13/16 1900)  . milrinone 0.25 mcg/kg/min (09/13/16 2144)  . norepinephrine (LEVOPHED) Adult infusion 75 mcg/min (09/13/16 2021)  . phenylephrine (NEO-SYNEPHRINE) Adult infusion 20 mcg/min (09/13/16 2239)  . propofol (DIPRIVAN) infusion 5.021 mcg/kg/min (09/13/16 1900)  . pureflow 2,000 mL (09/13/16 2302)  . valproate sodium Stopped (09/13/16 1905)  . vasopressin (PITRESSIN) infusion - *FOR SHOCK* 0.03 Units/min (09/13/16 1900)   6/16 ~ 08:00  K = 3.9, Mag = 2.0.  No supplementation is needed at this time.  BMP is ordered daily. Mag is ordered every 4 hours.    6/17 20:30 electrolytes WNL.  6/18 0500 electrolytes WNL except Na 133. No indication for replacement at this time.  Michille Mcelrath S, RPH 09/14/2016,12:02 AM

## 2016-09-14 NOTE — Progress Notes (Signed)
Levophed being titrated down and neo-synephrine being titrated up as per Dr. Sung AmabileSimonds request.  When Dr. Sung AmabileSimonds notified of progress with drip titration during afternoon rounds he requested that neo-synephrine be increased more rapidly at larger doses along with levophed to be decreased at more rapidly at larger doses. BP being cycles every 5 minutes and patient being monitored closely for changes.

## 2016-09-14 NOTE — Progress Notes (Signed)
Patient ID: Isaiah [name], male   DOB: July 04, 1965, 51 y.o.   MRN: [id number]   [facility] PROGRESS NOTE  Isaiah [name] LOV:[id number] DOB: [birth year] DOA: 09/06/2016 PCP: Patient, No Pcp Per  HPI/Subjective: Pt blood pressure is low ,hr elevated, has frothy sputum from et tube  Objective: Vitals:   09/14/16 0630 09/14/16 0645  BP: 110/65 108/74  Pulse: (!) 146 (!) 153  Resp: (!) 21 20  Temp: 97.7 F (36.5 C) 97.7 F (36.5 C)    Filed Weights   09/12/16 0300 09/13/16 0330 09/14/16 0446  Weight: (!) 477 lb (216.4 kg) (!) 483 lb (219.1 kg) (!) 487 lb (220.9 kg)    ROS: Review of Systems  Unable to perform ROS: Critical illness   Exam: Physical Exam  HENT:  Nose: No mucosal edema.  Unable to look into mouth at this time  Eyes: Conjunctivae and lids are normal.  Pupils pinpoint  Neck: Carotid bruit is not present. No thyroid mass and no thyromegaly present.  Cardiovascular: S1 normal and S2 normal.  An irregularly irregular rhythm present. Tachycardia present.  Exam reveals distant heart sounds. Exam reveals no gallop.   No murmur heard. Pulses:      Dorsalis pedis pulses are 2+ on the right side, and 2+ on the left side.  Respiratory: No respiratory distress. He has decreased breath sounds in the right middle field, the right lower field, the left middle field and the left lower field. He has wheezes in the right middle field and the left middle field. He has rhonchi in the right lower field and the left lower field. He has no rales.  GI: Soft. Bowel sounds are normal. There is no tenderness.  Musculoskeletal:       Right knee: He exhibits swelling.       Left knee: He exhibits swelling.       Right ankle: He exhibits swelling.       Left ankle: He exhibits swelling.  Lymphadenopathy:    He has no cervical adenopathy.  Neurological: He is unresponsive.  Unresponsive to painful stimuli  Skin: No rash noted. Nails show no clubbing.  Psychiatric:  Unable to  assess at this time secondary to unresponsive      Data Reviewed: Basic Metabolic Panel:  Recent Labs Lab 09/13/16 1237 09/13/16 1641 09/13/16 2033 09/14/16 0026 09/14/16 0513  NA 134* 133* 135 131* 133*  K 4.2 4.2 4.2 4.0 4.1  CL 102 99* 101 99* 101  CO2 _0 GLUCOSE 125* 135* 143* 157* 160*  BUN 37* 38* 37* 37* 35*  CREATININE 2.54* 2.51* 2.54* 2.45* 2.28*  CALCIUM 8.3* 8.1* 8.3* 8.0* 8.1*  MG 2.0 2.0 2.0 1.8 1.9  PHOS 3.7 3.3 3.6 3.3 3.1   Liver Function Tests:  Recent Labs Lab 09/11/16 0458  09/12/16 0359  09/13/16 1237 09/13/16 1641 09/13/16 2033 09/14/16 0026 09/14/16 0513  AST 40  --  28  --   --   --   --   --  29  ALT 23  --  15*  --   --   --   --   --  10*  ALKPHOS 72  --  53  --   --   --   --   --  73  BILITOT 0.7  --  0.8  --   --   --   --   --  1.9*  PROT 6.9  --  5.2*  --   --   --   --   --  6.2*  ALBUMIN 2.8*  2.8*  < > 2.0*  < > 2.2* 2.1* 2.1* 2.0* 2.0*  < > = values in this interval not displayed. CBC:  Recent Labs Lab 09/08/16 0152 09/09/16 0431 09/11/16 0458 09/12/16 0359 09/14/16 0513  WBC 8.2 12.1* 12.8* 12.4* 17.6*  HGB 13.3 12.8* 11.5* 9.8* 10.4*  HCT 41.3 39.8* 36.4* 31.0* 32.5*  MCV 80.5 80.1 81.3 80.8 79.4*  PLT 178 185 202 125* 139*   Cardiac Enzymes:  Recent Labs Lab 09/08/16 0152 09/08/16 0532 09/13/16 0811 09/13/16 1237 09/13/16 1815  TROPONINI 0.21* 0.15* 0.05* 0.06* 0.05*   BNP (last 3 results)  Recent Labs  09/06/2016 0750  BNP 508.0*     CBG:  Recent Labs Lab 09/13/16 1639 09/13/16 2003 09/13/16 2345 09/14/16 0426 09/14/16 0740  GLUCAP 133* 122* 151* 139* 174*    Recent Results (from the past 240 hour(s))  Culture, blood (routine x 2)     Status: None (Preliminary result)   Collection Time: 09/18/2016  8:18 AM  Result Value Ref Range Status   Specimen Description BLOOD L AC  Final   Special Requests   Final    BOTTLES DRAWN AEROBIC AND ANAEROBIC Blood Culture adequate volume    Culture NO GROWTH 4 DAYS  Final   Report Status PENDING  Incomplete  MRSA PCR Screening     Status: None   Collection Time: 09/22/2016  9:46 AM  Result Value Ref Range Status   MRSA by PCR NEGATIVE NEGATIVE Final    Comment:        The GeneXpert MRSA Assay (FDA approved for NASAL specimens only), is one component of a comprehensive MRSA colonization surveillance program. It is not intended to diagnose MRSA infection nor to guide or monitor treatment for MRSA infections.   Culture, blood (routine x 2)     Status: None (Preliminary result)   Collection Time: 09/23/2016 11:45 AM  Result Value Ref Range Status   Specimen Description BLOOD L HAND  Final   Special Requests   Final    BOTTLES DRAWN AEROBIC ONLY Blood Culture results may not be optimal due to an inadequate volume of blood received in culture bottles   Culture NO GROWTH 4 DAYS  Final   Report Status PENDING  Incomplete  Culture, respiratory (NON-Expectorated)     Status: None   Collection Time: 09/03/2016  6:32 PM  Result Value Ref Range Status   Specimen Description TRACHEAL ASPIRATE  Final   Special Requests NONE  Final   Gram Stain   Final    FEW WBC PRESENT, PREDOMINANTLY PMN MODERATE GRAM POSITIVE COCCI IN CHAINS IN PAIRS MODERATE GRAM NEGATIVE RODS    Culture   Final    Consistent with normal respiratory flora. Performed at [facility], [place] [address], [place], [place] [address]    Report Status 09/10/2016 FINAL  Final     Studies: Dg Chest 1 View  Result Date: 09/14/2016 CLINICAL DATA:  Dyspnea, history of CHF, cardiac arrest, anoxic encephalopathy EXAM: CHEST 1 VIEW COMPARISON:  Portable chest x-ray of September 13, 2016 FINDINGS: The lungs are reasonably well inflated. Confluent alveolar opacities have worsened bilaterally with dense confluence noted along the lateral aspect of the left hemithorax. The cardiac silhouette remains enlarged and its margins indistinct. The pulmonary vascularity remains  engorged. The endotracheal tube tip lies approximately 3 cm above the carina. The right internal jugular venous catheter tip projects at the cavoatrial junction. The esophagogastric tube tip cannot  be clearly discerned. IMPRESSION: Slight interval worsening in the appearance of the lungs consistent with pulmonary edema and/or pneumonia. The visualized support tubes are in reasonable position. The esophagogastric tube tip is not demonstrated. Electronically Signed   By: David  Martinique M.D.   On: 09/14/2016 07:00   Dg Chest 1 View  Result Date: 09/13/2016 CLINICAL DATA:  Routine am cxr for Anoxic-ischemic encephalopathy EXAM: CHEST 1 VIEW COMPARISON:  09/12/2016 FINDINGS: Right IJ central line tip overlies the level of superior vena cava. Endotracheal tube is in place, 5.6 cm above the carina. Airspace filling opacity within the lungs bilaterally, increased on the left since the prior study. The heart is enlarged. IMPRESSION: Persistent bilateral airspace filling opacities, increased on the left. Electronically Signed   By: Nolon Nations M.D.   On: 09/13/2016 08:59   Dg Chest Port 1 View  Result Date: 09/13/2016 CLINICAL DATA:  Acute respiratory failure. EXAM: PORTABLE CHEST 1 VIEW COMPARISON:  09/13/16 FINDINGS: The ET tube tip is above the carina. There is a right IJ catheter identified. The tip of which cannot be confidently identified. The heart size is enlarged. Large left pleural effusion is again noted and appears unchanged. There is significantly diminished aeration to the left lung, unchanged from previous exam. Diffuse right lung opacities are also unchanged. IMPRESSION: 1. No change in aeration a lungs compared with previous exam. 2. Cannot confirm location of tip of right IJ catheter. Electronically Signed   By: Kerby Moors M.D.   On: 09/13/2016 07:49    Scheduled Meds: . budesonide (PULMICORT) nebulizer solution  0.5 mg Nebulization BID  . chlorhexidine gluconate (MEDLINE KIT)  15 mL  Mouth Rinse BID  . docusate  100 mg Per Tube BID  . feeding supplement (PRO-STAT SUGAR FREE 64)  30 mL Per Tube Q4H  . insulin aspart  2-6 Units Subcutaneous Q4H  . insulin glargine  10 Units Subcutaneous Q24H  . ipratropium-albuterol  3 mL Nebulization Q6H  . mouth rinse  15 mL Mouth Rinse 10 times per day  . pantoprazole sodium  40 mg Per Tube Daily  . sennosides  5 mL Per Tube BID  . sodium chloride flush  10-40 mL Intracatheter Q12H  . sodium chloride flush  10-40 mL Intracatheter Q12H   Continuous Infusions: . sodium chloride 10 mL/hr at 09/13/16 1900  . amiodarone 30 mg/hr (09/14/16 0104)  . dexmedetomidine (PRECEDEX) IV infusion Stopped (09/13/16 0942)  . feeding supplement (VITAL HIGH PROTEIN) Stopped (09/12/16 0800)  . fentaNYL infusion INTRAVENOUS 75 mcg/hr (09/13/16 1900)  . heparin 1,850 Units/hr (09/14/16 0406)  . milrinone 0.25 mcg/kg/min (09/14/16 0339)  . norepinephrine (LEVOPHED) Adult infusion 75 mcg/min (09/14/16 0435)  . phenylephrine (NEO-SYNEPHRINE) Adult infusion 170 mcg/min (09/14/16 0538)  . propofol (DIPRIVAN) infusion 25 mcg/kg/min (09/14/16 0448)  . pureflow 2,000 mL (09/13/16 2302)  . valproate sodium Stopped (09/14/16 0320)  . vasopressin (PITRESSIN) infusion - *FOR SHOCK* 0.03 Units/min (09/14/16 0045)    Assessment/Plan:  1. Cardiac arrest With multiorgan failure. Suspect due to arrhythmia related with an EF of 10% seen on echocardiogram.  Overall prognosis poor with anoxic encephalopathy. BP dropping, hr elevated.  2. 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 On milrinone.  With EF of 10% his overall prognosis is poor. Decompensation of congestive heart failure unfortunately due to his low blood pressure unable to diurese 3. Acute kidney injury secondary to cardiac arrest, CT scan with contrast. Continue CRRT 4. Anoxic encephalopathy with  brain edema. Acute encephalopathy. Patient currently on sedation  with fentanyl and versed. Previous EEG showing no seizures. Patient started on valproate sodium.  Neurology follow up.  Palliative care team following prognosis poor, pleural effusions and later today 5. Acute hypoxic respiratory failure. Patient has been on 100% FiO2 6. Hypotension- Continue Levophed  7. Type 2 diabetes mellitus. On sliding scale and low-dose Levemir. 8. Rapid Atrial fibrillation. Continue heparin drip. Patient on amiodarone drip and Cardizem 9. Morbid obesity and likely sleep apnea.  Code Status:     Code Status Orders        Start     Ordered   09/22/2016 1005  Full code  Continuous     08/29/2016 1007    Code Status History    Date Active Date Inactive Code Status Order ID Comments User Context   09/02/2016  8:30 AM 09/11/2016 10:08 AM Full Code [id number]  Loletha Grayer, MD ED     Family Communication: Discussed with mother  Consultants:  Critical care specialist  Cardiology  Neurology  Nephrology  Palliative care consultation  Time spent: 26 minutes  [place], [facility]           googel

## 2016-09-14 NOTE — Progress Notes (Signed)
[facility]  ROUNDING NOTE   Subjective:   Patient remains critically ill,intubated and sedated  Now on vasopressin, milrinone and norepinephrine.   CRRT UF 0 UOP 445, NG output 1750   Fio2 100%, PEEP 20   Objective:  Vital signs in last 24 hours:  Temp:  [97.5 F (36.4 C)-99.1 F (37.3 C)] 97.7 F (36.5 C) (06/18 0950) Pulse Rate:  [90-160] 143 (06/18 0950) Resp:  [14-37] 18 (06/18 0950) BP: (56-134)/(35-86) 121/72 (06/18 0950) SpO2:  [90 %-100 %] 98 % (06/18 0950) FiO2 (%):  [100 %] 100 % (06/18 0950) Weight:  [220.9 kg (487 lb)] 220.9 kg (487 lb) (06/18 0446)  Weight change: 1.814 kg (4 lb) Filed Weights   09/12/16 0300 09/13/16 0330 09/14/16 0446  Weight: (!) 216.4 kg (477 lb) (!) 219.1 kg (483 lb) (!) 220.9 kg (487 lb)    Intake/Output: I/O last 3 completed shifts: In: 6224.8 [I.V.:5959.8; NG/GT:85; IV Piggyback:180] Out: 2763 [Urine:855; Emesis/NG output:1750; Other:158]   Intake/Output this shift:  Total I/O In: 666.2 [I.V.:666.2] Out: -   Physical Exam: General: Critically ill   H, ENT ETT  Eyes: Eyes closed. Conjunctival edema  Neck: JVD not able to be examined due to obesity  Lungs:  Diminished, PRVC FiO2 100%  Heart: Irregular, tachycardia  Abdomen:  Soft, nontender, obese  Extremities: + dependent  peripheral edema; anasarca  Neurologic: Intubated, sedated  Skin: No lesions  Access: RIJ temp HD catheter 6/13 Dr. Ashby Dawes    Basic Metabolic Panel:  Recent Labs Lab 09/13/16 1641 09/13/16 2033 09/14/16 0026 09/14/16 0513 09/14/16 0846  NA 133* 135 131* 133* 132*  K 4.2 4.2 4.0 4.1 4.1  CL 99* 101 99* 101 101  CO2 27 26 26 27 27   GLUCOSE 135* 143* 157* 160* 157*  BUN 38* 37* 37* 35* 34*  CREATININE 2.51* 2.54* 2.45* 2.28* 2.29*  CALCIUM 8.1* 8.3* 8.0* 8.1* 8.2*  MG 2.0 2.0 1.8 1.9 1.9  PHOS 3.3 3.6 3.3 3.1 3.0    Liver Function Tests:  Recent Labs Lab 09/11/16 0458  09/12/16 0359  09/13/16 1641 09/13/16 2033  09/14/16 0026 09/14/16 0513 09/14/16 0846  AST 40  --  28  --   --   --   --  29  --   ALT 23  --  15*  --   --   --   --  10*  --   ALKPHOS 72  --  53  --   --   --   --  73  --   BILITOT 0.7  --  0.8  --   --   --   --  1.9*  --   PROT 6.9  --  5.2*  --   --   --   --  6.2*  --   ALBUMIN 2.8*  2.8*  < > 2.0*  < > 2.1* 2.1* 2.0* 2.0* 2.1*  < > = values in this interval not displayed. No results for input(s): LIPASE, AMYLASE in the last 168 hours. No results for input(s): AMMONIA in the last 168 hours.  CBC:  Recent Labs Lab 09/08/16 0152 09/09/16 0431 09/11/16 0458 09/12/16 0359 09/14/16 0513  WBC 8.2 12.1* 12.8* 12.4* 17.6*  HGB 13.3 12.8* 11.5* 9.8* 10.4*  HCT 41.3 39.8* 36.4* 31.0* 32.5*  MCV 80.5 80.1 81.3 80.8 79.4*  PLT 178 185 202 125* 139*    Cardiac Enzymes:  Recent Labs Lab 09/08/16 0152 09/08/16 0532 09/13/16 0811 09/13/16  1237 09/13/16 1815  TROPONINI 0.21* 0.15* 0.05* 0.06* 0.05*    BNP: Invalid input(s): POCBNP  CBG:  Recent Labs Lab 09/13/16 1639 09/13/16 2003 09/13/16 2345 09/14/16 0426 09/14/16 0740  GLUCAP 133* 122* 151* 139* 174*    Microbiology: Results for orders placed or performed during the hospital encounter of 09/22/2016  Culture, blood (routine x 2)     Status: None (Preliminary result)   Collection Time: 09/06/2016  8:18 AM  Result Value Ref Range Status   Specimen Description BLOOD L AC  Final   Special Requests   Final    BOTTLES DRAWN AEROBIC AND ANAEROBIC Blood Culture adequate volume   Culture NO GROWTH 4 DAYS  Final   Report Status PENDING  Incomplete  MRSA PCR Screening     Status: None   Collection Time: 09/04/2016  9:46 AM  Result Value Ref Range Status   MRSA by PCR NEGATIVE NEGATIVE Final    Comment:        The GeneXpert MRSA Assay (FDA approved for NASAL specimens only), is one component of a comprehensive MRSA colonization surveillance program. It is not intended to diagnose MRSA infection nor to guide  or monitor treatment for MRSA infections.   Culture, blood (routine x 2)     Status: None (Preliminary result)   Collection Time: 08/31/2016 11:45 AM  Result Value Ref Range Status   Specimen Description BLOOD L HAND  Final   Special Requests   Final    BOTTLES DRAWN AEROBIC ONLY Blood Culture results may not be optimal due to an inadequate volume of blood received in culture bottles   Culture NO GROWTH 4 DAYS  Final   Report Status PENDING  Incomplete  Culture, respiratory (NON-Expectorated)     Status: None   Collection Time: 09/08/2016  6:32 PM  Result Value Ref Range Status   Specimen Description TRACHEAL ASPIRATE  Final   Special Requests NONE  Final   Gram Stain   Final    FEW WBC PRESENT, PREDOMINANTLY PMN MODERATE GRAM POSITIVE COCCI IN CHAINS IN PAIRS MODERATE GRAM NEGATIVE RODS    Culture   Final    Consistent with normal respiratory flora. Performed at [facility], [place] [address], [place], [place] [address]    Report Status 09/10/2016 FINAL  Final    Coagulation Studies: No results for input(s): LABPROT, INR in the last 72 hours.  Urinalysis: No results for input(s): COLORURINE, LABSPEC, PHURINE, GLUCOSEU, HGBUR, BILIRUBINUR, KETONESUR, PROTEINUR, UROBILINOGEN, NITRITE, LEUKOCYTESUR in the last 72 hours.  Invalid input(s): APPERANCEUR    Imaging: Dg Chest 1 View  Result Date: 09/14/2016 CLINICAL DATA:  Dyspnea, history of CHF, cardiac arrest, anoxic encephalopathy EXAM: CHEST 1 VIEW COMPARISON:  Portable chest x-ray of September 13, 2016 FINDINGS: The lungs are reasonably well inflated. Confluent alveolar opacities have worsened bilaterally with dense confluence noted along the lateral aspect of the left hemithorax. The cardiac silhouette remains enlarged and its margins indistinct. The pulmonary vascularity remains engorged. The endotracheal tube tip lies approximately 3 cm above the carina. The right internal jugular venous catheter tip projects at the cavoatrial  junction. The esophagogastric tube tip cannot be clearly discerned. IMPRESSION: Slight interval worsening in the appearance of the lungs consistent with pulmonary edema and/or pneumonia. The visualized support tubes are in reasonable position. The esophagogastric tube tip is not demonstrated. Electronically Signed   By: David  Martinique M.D.   On: 09/14/2016 07:00   Dg Chest 1 View  Result Date:  09/13/2016 CLINICAL DATA:  Routine am cxr for Anoxic-ischemic encephalopathy EXAM: CHEST 1 VIEW COMPARISON:  09/12/2016 FINDINGS: Right IJ central line tip overlies the level of superior vena cava. Endotracheal tube is in place, 5.6 cm above the carina. Airspace filling opacity within the lungs bilaterally, increased on the left since the prior study. The heart is enlarged. IMPRESSION: Persistent bilateral airspace filling opacities, increased on the left. Electronically Signed   By: Nolon Nations M.D.   On: 09/13/2016 08:59   Dg Chest Port 1 View  Result Date: 09/13/2016 CLINICAL DATA:  Acute respiratory failure. EXAM: PORTABLE CHEST 1 VIEW COMPARISON:  09/13/16 FINDINGS: The ET tube tip is above the carina. There is a right IJ catheter identified. The tip of which cannot be confidently identified. The heart size is enlarged. Large left pleural effusion is again noted and appears unchanged. There is significantly diminished aeration to the left lung, unchanged from previous exam. Diffuse right lung opacities are also unchanged. IMPRESSION: 1. No change in aeration a lungs compared with previous exam. 2. Cannot confirm location of tip of right IJ catheter. Electronically Signed   By: Kerby Moors M.D.   On: 09/13/2016 07:49     Medications:   . sodium chloride 10 mL/hr at 09/13/16 1900  . amiodarone 30 mg/hr (09/14/16 0104)  . feeding supplement (VITAL HIGH PROTEIN) Stopped (09/12/16 0800)  . fentaNYL infusion INTRAVENOUS 75 mcg/hr (09/13/16 1900)  . milrinone 0.25 mcg/kg/min (09/14/16 0339)  .  norepinephrine (LEVOPHED) Adult infusion 65 mcg/min (09/14/16 0956)  . phenylephrine (NEO-SYNEPHRINE) Adult infusion 200 mcg/min (09/14/16 0830)  . propofol (DIPRIVAN) infusion 15 mcg/kg/min (09/14/16 0924)  . pureflow 2,000 mL (09/13/16 2302)  . valproate sodium Stopped (09/14/16 0320)  . vasopressin (PITRESSIN) infusion - *FOR SHOCK* 0.03 Units/min (09/14/16 0045)   . budesonide (PULMICORT) nebulizer solution  0.5 mg Nebulization BID  . chlorhexidine gluconate (MEDLINE KIT)  15 mL Mouth Rinse BID  . docusate  100 mg Per Tube BID  . feeding supplement (PRO-STAT SUGAR FREE 64)  30 mL Per Tube Q4H  . heparin subcutaneous  5,000 Units Subcutaneous Q8H  . insulin aspart  2-6 Units Subcutaneous Q4H  . insulin glargine  10 Units Subcutaneous Q24H  . ipratropium-albuterol  3 mL Nebulization Q6H  . mouth rinse  15 mL Mouth Rinse 10 times per day  . pantoprazole sodium  40 mg Per Tube Daily  . sennosides  5 mL Per Tube BID  . sodium chloride flush  10-40 mL Intracatheter Q12H  . sodium chloride flush  10-40 mL Intracatheter Q12H   acetaminophen **OR** acetaminophen, fentaNYL, heparin, polyvinyl alcohol, sodium chloride flush, sodium chloride flush  Assessment/ Plan:  Mr. DAEMIAN GAHM is a 51 y.o. black male with diabetes mellitus type II, hyeprtension, congestive heart failure, atrial fibrillation,  who was admitted to [facility] on 09/22/2016 for Cardiac arrest ([place]) [I46.9]  1. Acute renal failure: on chronic kidney disease stage III baseline creatinine of 1.4 04/10/16.  Nonoliguric. Iv contrast exposure for CT angiogram at admission On CRRT 4K bath. Start UF of 32m/hr - Add iv Albumin - Monitor renal function, volume status, urine output and electrolytes. Low threshold to restart hemodialysis  2. Cardiogenic shock with hypotension, with anasarca: on vasopressors: norepinephrine, milrinone Supportive care Start UF with iv albumin Obligate intake 200 cc /hr  3. Acute Respiratory Failure:  intubated and sedated.  FiO2 100%      LOS: 7 Keyonna [place]un[place] 6/18/201810:23 AM

## 2016-09-14 NOTE — Progress Notes (Signed)
Dr. Velora HecklerInvited [facility] To sit with family while he discussed the Pt. Condition. The Dr. Algis DownsAdvised the family that the Pt. Would not improve and his condition will only worsen. The Dr. believed it was time for the family to discuss the inevitable. The Pt. Functions were all shutting down. Isaiah Devonh. Spoke with family after the Dr. left giving emotional support and prayer.

## 2016-09-14 NOTE — Progress Notes (Signed)
PHARMACY - CRITICAL CARE PROGRESS NOTE  Pharmacy Consult for Amiodarone Interactions and CRRT Dosage Adjustments   No Known Allergies  Patient Measurements: Height: 6' (182.9 cm) Weight: (!) 487 lb (220.9 kg) IBW/kg (Calculated) : 77.6  Vital Signs: Temp: 97.7 F (36.5 C) (06/18 0950) BP: 121/72 (06/18 0950) Pulse Rate: 143 (06/18 0950) Intake/Output from previous day: 06/17 0701 - 06/18 0700 In: 4678.5 [I.V.:4473.5; NG/GT:85; IV Piggyback:120] Out: 2353 [Urine:445; Emesis/NG output:1750] Intake/Output from this shift: Total I/O In: 666.2 [I.V.:666.2] Out: -  Vent settings for last 24 hours: Vent Mode: PRVC FiO2 (%):  [100 %] 100 % Set Rate:  [24 bmp] 24 bmp Vt Set:  [550 mL] 550 mL PEEP:  [20 cmH20] 20 cmH20 Plateau Pressure:  [30 cmH20-39 cmH20] 39 cmH20  Labs:  Recent Labs  09/12/16 0359  09/14/16 0026 09/14/16 0513 09/14/16 0846  WBC 12.4*  --   --  17.6*  --   HGB 9.8*  --   --  10.4*  --   HCT 31.0*  --   --  32.5*  --   PLT 125*  --   --  139*  --   CREATININE 2.95*  < > 2.45* 2.28* 2.29*  MG 1.4*  < > 1.8 1.9 1.9  PHOS 2.8  < > 3.3 3.1 3.0  ALBUMIN 2.0*  < > 2.0* 2.0* 2.1*  PROT 5.2*  --   --  6.2*  --   AST 28  --   --  29  --   ALT 15*  --   --  10*  --   ALKPHOS 53  --   --  73  --   BILITOT 0.8  --   --  1.9*  --   < > = values in this interval not displayed. Estimated Creatinine Clearance: 75.3 mL/min (A) (by C-G formula based on SCr of 2.29 mg/dL (H)).   Recent Labs  09/13/16 2345 09/14/16 0426 09/14/16 0740  GLUCAP 151* 139* 174*    Assessment: 51 y/o M s/p cardiac arrest and Code ICE on ventilation, sedation, vasopressors, and CRRT.   Plan:  1. There are currently no significant drug interactions with amiodarone.   Recommendations: Amiodarone is metabolized by the cytochrome P450 system and therefore has the potential to cause many drug interactions. Amiodarone has an average plasma half-life of 50 days (range 20 to 100 days).    There is potential for drug interactions to occur several weeks or months after stopping treatment and the onset of drug interactions may be slow after initiating amiodarone.   []  Statins: Increased risk of myopathy. Simvastatin- restrict dose to 20mg  daily. Other statins: counsel patients to report any muscle pain or weakness immediately.  []  Anticoagulants: Amiodarone can increase anticoagulant effect. Consider warfarin dose reduction. Patients should be monitored closely and the dose of anticoagulant altered accordingly, remembering that amiodarone levels take several weeks to stabilize.  []  Antiepileptics: Amiodarone can increase plasma concentration of phenytoin, the dose should be reduced. Note that small changes in phenytoin dose can result in large changes in levels. Monitor patient and counsel on signs of toxicity.  []  Beta blockers: increased risk of bradycardia, AV block and myocardial depression. Sotalol - avoid concomitant use.  []   Calcium channel blockers (diltiazem and verapamil): increased risk of bradycardia, AV block and myocardial depression.  []   Cyclosporine: Amiodarone increases levels of cyclosporine. Reduced dose of cyclosporine is recommended.  []  Digoxin dose should be halved when amiodarone is started.  []   Diuretics: increased risk of cardiotoxicity if hypokalemia occurs.  []  Oral hypoglycemic agents (glyburide, glipizide, glimepiride): increased risk of hypoglycemia. Patient's glucose levels should be monitored closely when initiating amiodarone therapy.   []  Drugs that prolong the QT interval:  Torsades de pointes risk may be increased with concurrent use - avoid if possible.  Monitor QTc, also keep magnesium/potassium WNL if concurrent therapy can't be avoided. Marland Kitchen Antibiotics: e.g. fluoroquinolones, erythromycin. . Antiarrhythmics: e.g. quinidine, procainamide, disopyramide, sotalol. . Antipsychotics: e.g. phenothiazines, haloperidol.  . Lithium, tricyclic  antidepressants, and methadone.  2. No medications require adjustment for CRRT at present.   Luisa Hart D 09/14/2016,11:01 AM

## 2016-09-15 DIAGNOSIS — N179 Acute kidney failure, unspecified: Secondary | ICD-10-CM

## 2016-09-15 LAB — RENAL FUNCTION PANEL
ALBUMIN: 2.1 g/dL — AB (ref 3.5–5.0)
ALBUMIN: 2.2 g/dL — AB (ref 3.5–5.0)
ALBUMIN: 2.2 g/dL — AB (ref 3.5–5.0)
ALBUMIN: 2.3 g/dL — AB (ref 3.5–5.0)
ANION GAP: 5 (ref 5–15)
ANION GAP: 6 (ref 5–15)
ANION GAP: 7 (ref 5–15)
ANION GAP: 7 (ref 5–15)
Albumin: 2 g/dL — ABNORMAL LOW (ref 3.5–5.0)
Albumin: 2 g/dL — ABNORMAL LOW (ref 3.5–5.0)
Anion gap: 6 (ref 5–15)
Anion gap: 6 (ref 5–15)
BUN: 29 mg/dL — AB (ref 6–20)
BUN: 30 mg/dL — AB (ref 6–20)
BUN: 30 mg/dL — AB (ref 6–20)
BUN: 30 mg/dL — ABNORMAL HIGH (ref 6–20)
BUN: 31 mg/dL — AB (ref 6–20)
BUN: 31 mg/dL — ABNORMAL HIGH (ref 6–20)
CALCIUM: 8.2 mg/dL — AB (ref 8.9–10.3)
CALCIUM: 8.3 mg/dL — AB (ref 8.9–10.3)
CHLORIDE: 104 mmol/L (ref 101–111)
CHLORIDE: 102 mmol/L (ref 101–111)
CHLORIDE: 104 mmol/L (ref 101–111)
CO2: 26 mmol/L (ref 22–32)
CO2: 26 mmol/L (ref 22–32)
CO2: 26 mmol/L (ref 22–32)
CO2: 26 mmol/L (ref 22–32)
CO2: 27 mmol/L (ref 22–32)
CO2: 27 mmol/L (ref 22–32)
CREATININE: 1.96 mg/dL — AB (ref 0.61–1.24)
CREATININE: 1.88 mg/dL — AB (ref 0.61–1.24)
CREATININE: 1.93 mg/dL — AB (ref 0.61–1.24)
Calcium: 8.3 mg/dL — ABNORMAL LOW (ref 8.9–10.3)
Calcium: 8.2 mg/dL — ABNORMAL LOW (ref 8.9–10.3)
Calcium: 8.2 mg/dL — ABNORMAL LOW (ref 8.9–10.3)
Calcium: 8.4 mg/dL — ABNORMAL LOW (ref 8.9–10.3)
Chloride: 102 mmol/L (ref 101–111)
Chloride: 103 mmol/L (ref 101–111)
Chloride: 103 mmol/L (ref 101–111)
Creatinine, Ser: 1.93 mg/dL — ABNORMAL HIGH (ref 0.61–1.24)
Creatinine, Ser: 2.03 mg/dL — ABNORMAL HIGH (ref 0.61–1.24)
Creatinine, Ser: 2.06 mg/dL — ABNORMAL HIGH (ref 0.61–1.24)
GFR calc Af Amer: 42 mL/min — ABNORMAL LOW (ref >60)
GFR calc Af Amer: 43 mL/min — ABNORMAL LOW (ref >60)
GFR calc Af Amer: 45 mL/min — ABNORMAL LOW (ref >60)
GFR calc Af Amer: 46 mL/min — ABNORMAL LOW (ref >60)
GFR calc non Af Amer: 37 mL/min — ABNORMAL LOW (ref >60)
GFR calc non Af Amer: 41 mL/min — ABNORMAL LOW (ref >60)
GFR calc non Af Amer: 39 mL/min — ABNORMAL LOW (ref >60)
GFR, EST AFRICAN AMERICAN: 46 mL/min — AB (ref >60)
GFR, EST AFRICAN AMERICAN: 47 mL/min — AB (ref >60)
GFR, EST NON AFRICAN AMERICAN: 36 mL/min — AB (ref >60)
GFR, EST NON AFRICAN AMERICAN: 39 mL/min — AB (ref >60)
GFR, EST NON AFRICAN AMERICAN: 39 mL/min — AB (ref >60)
GLUCOSE: 155 mg/dL — AB (ref 65–99)
GLUCOSE: 149 mg/dL — AB (ref 65–99)
GLUCOSE: 127 mg/dL — AB (ref 65–99)
Glucose, Bld: 136 mg/dL — ABNORMAL HIGH (ref 65–99)
Glucose, Bld: 146 mg/dL — ABNORMAL HIGH (ref 65–99)
Glucose, Bld: 151 mg/dL — ABNORMAL HIGH (ref 65–99)
PHOSPHORUS: 2.3 mg/dL — AB (ref 2.5–4.6)
PHOSPHORUS: 2.4 mg/dL — AB (ref 2.5–4.6)
PHOSPHORUS: 2.5 mg/dL (ref 2.5–4.6)
PHOSPHORUS: 2.5 mg/dL (ref 2.5–4.6)
PHOSPHORUS: 2.8 mg/dL (ref 2.5–4.6)
POTASSIUM: 3.8 mmol/L (ref 3.5–5.1)
POTASSIUM: 3.8 mmol/L (ref 3.5–5.1)
POTASSIUM: 3.9 mmol/L (ref 3.5–5.1)
POTASSIUM: 3.9 mmol/L (ref 3.5–5.1)
POTASSIUM: 3.9 mmol/L (ref 3.5–5.1)
Phosphorus: 2.3 mg/dL — ABNORMAL LOW (ref 2.5–4.6)
Potassium: 3.8 mmol/L (ref 3.5–5.1)
SODIUM: 134 mmol/L — AB (ref 135–145)
SODIUM: 135 mmol/L (ref 135–145)
Sodium: 135 mmol/L (ref 135–145)
Sodium: 137 mmol/L (ref 135–145)
Sodium: 135 mmol/L (ref 135–145)
Sodium: 137 mmol/L (ref 135–145)

## 2016-09-15 LAB — CBC
HEMATOCRIT: 30.8 % — AB (ref 40.0–52.0)
HEMOGLOBIN: 10 g/dL — AB (ref 13.0–18.0)
MCH: 26.3 pg (ref 26.0–34.0)
MCHC: 32.6 g/dL (ref 32.0–36.0)
MCV: 80.8 fL (ref 80.0–100.0)
Platelets: 110 10*3/uL — ABNORMAL LOW (ref 150–440)
RBC: 3.81 MIL/uL — ABNORMAL LOW (ref 4.40–5.90)
RDW: 18.1 % — AB (ref 11.5–14.5)
WBC: 14.8 10*3/uL — ABNORMAL HIGH (ref 3.8–10.6)

## 2016-09-15 LAB — MAGNESIUM
MAGNESIUM: 1.8 mg/dL (ref 1.7–2.4)
MAGNESIUM: 1.8 mg/dL (ref 1.7–2.4)
Magnesium: 1.8 mg/dL (ref 1.7–2.4)
Magnesium: 1.8 mg/dL (ref 1.7–2.4)
Magnesium: 1.8 mg/dL (ref 1.7–2.4)
Magnesium: 1.8 mg/dL (ref 1.7–2.4)

## 2016-09-15 LAB — BASIC METABOLIC PANEL
ANION GAP: 6 (ref 5–15)
BUN: 30 mg/dL — AB (ref 6–20)
CO2: 27 mmol/L (ref 22–32)
Calcium: 8.2 mg/dL — ABNORMAL LOW (ref 8.9–10.3)
Chloride: 103 mmol/L (ref 101–111)
Creatinine, Ser: 1.94 mg/dL — ABNORMAL HIGH (ref 0.61–1.24)
GFR calc Af Amer: 45 mL/min — ABNORMAL LOW (ref >60)
GFR, EST NON AFRICAN AMERICAN: 39 mL/min — AB (ref >60)
GLUCOSE: 154 mg/dL — AB (ref 65–99)
Potassium: 3.9 mmol/L (ref 3.5–5.1)
Sodium: 136 mmol/L (ref 135–145)

## 2016-09-15 LAB — GLUCOSE, CAPILLARY
GLUCOSE-CAPILLARY: 136 mg/dL — AB (ref 65–99)
GLUCOSE-CAPILLARY: 155 mg/dL — AB (ref 65–99)
Glucose-Capillary: 152 mg/dL — ABNORMAL HIGH (ref 65–99)
Glucose-Capillary: 144 mg/dL — ABNORMAL HIGH (ref 65–99)
Glucose-Capillary: 115 mg/dL — ABNORMAL HIGH (ref 65–99)
Glucose-Capillary: 120 mg/dL — ABNORMAL HIGH (ref 65–99)

## 2016-09-15 MED ORDER — PANTOPRAZOLE SODIUM 40 MG IV SOLR
40.0000 mg | Freq: Every day | INTRAVENOUS | Status: DC
  Administered 2016-09-15: 40 mg via INTRAVENOUS
  Filled 2016-09-15: qty 40

## 2016-09-15 MED ORDER — VITAL HIGH PROTEIN PO LIQD: 1000.0000 mL | ORAL | Status: DC

## 2016-09-15 MED ORDER — ACETAMINOPHEN 650 MG RE SUPP
650.0000 mg | Freq: Four times a day (QID) | RECTAL | Status: DC | PRN
  Administered 2016-09-19: 650 mg via RECTAL
  Filled 2016-09-15: qty 1

## 2016-09-15 MED ORDER — ARTIFICIAL TEARS OPHTHALMIC OINT
TOPICAL_OINTMENT | OPHTHALMIC | Status: DC | PRN
  Administered 2016-09-15 – 2016-09-17 (×5): via OPHTHALMIC
  Administered 2016-09-19: 1 via OPHTHALMIC
  Filled 2016-09-15 (×2): qty 3.5

## 2016-09-15 MED ORDER — ACETAMINOPHEN 325 MG PO TABS: 650.0000 mg | ORAL_TABLET | Freq: Four times a day (QID) | ORAL | Status: DC | PRN

## 2016-09-15 MED ORDER — BISACODYL 10 MG RE SUPP
10.0000 mg | Freq: Once | RECTAL | Status: AC
  Administered 2016-09-15: 10 mg via RECTAL
  Filled 2016-09-15: qty 1

## 2016-09-15 MED FILL — Medication: Qty: 1 | Status: AC

## 2016-09-15 NOTE — Progress Notes (Signed)
Subjective: Patient remains unresponsive.  On pressors and Fentanyl.    Objective: Current vital signs: BP 124/75   Pulse (!) 137   Temp 98.6 F (37 C)   Resp (!) 32   Ht 6' (1.829 m)   Wt (!) 220.9 kg (487 lb)   SpO2 100%   BMI 66.05 kg/m  Vital signs in last 24 hours: Temp:  [96.8 F (36 C)-99 F (37.2 C)] 98.6 F (37 C) (06/19 1000) Pulse Rate:  [29-155] 137 (06/19 1000) Resp:  [19-36] 32 (06/19 1000) BP: (72-125)/(48-97) 124/75 (06/19 1000) SpO2:  [90 %-100 %] 100 % (06/19 1000) FiO2 (%):  [10 %-100 %] 90 % (06/19 0700)  Intake/Output from previous day: 06/18 0701 - 06/19 0700 In: 2960.4 [I.V.:2790.4; IV Piggyback:170] Out: 1314 [Urine:425] Intake/Output this shift: Total I/O In: 3516 [I.V.:3486; NG/GT:30] Out: 20 [Other:20] Nutritional status:    Neurologic Exam: Mental Status: Patient does not respond to verbal stimuli. Does not respond to deep sternal rub. Does not follow commands. No verbalizations noted.  Cranial Nerves: II: patient does not respond confrontation bilaterally, pupils right 36m, left 335mand sluggishly reactivebilaterally III,IV,VI: doll's response absent bilaterally.  V,VII: corneal reflex reduced bilaterally VIII: patient does not respond to verbal stimuli IX,X: gag reflex reduced, XI: trapezius strength unable to test bilaterally XII: tongue strength unable to test Motor: Extremities flaccid throughout. No spontaneous movement noted. No purposeful movements noted. Sensory: Does not respond to noxious stimuli in any extremity. Deep Tendon Reflexes:  1+ throughout Plantars: Mute bilaterally Cerebellar: Unable to perform  Lab Results: Basic Metabolic Panel:  Recent Labs Lab 09/14/16 1618 09/14/16 1934 09/15/16 0010 09/15/16 0030 09/15/16 0420 09/15/16 0844  NA 133* 133* 135  --  135  136 137  K 3.8 3.9 3.9  --  3.9  3.9 3.8  CL 101 100* 103  --  103  103 104  CO2 _0 --  _1 GLUCOSE 146* 151*  151*  --  155*  154* 149*  BUN 32* 31* 31*  --  29*  30* 31*  CREATININE 2.15* 2.09* 2.06*  --  1.96*  1.94* 2.03*  CALCIUM 8.2* 8.1* 8.2*  --  8.3*  8.2* 8.2*  MG 1.9 1.8  --  1.8 1.8 1.8  PHOS 2.5 2.5 2.4*  --  2.3* 2.3*    Liver Function Tests:  Recent Labs Lab 09/11/16 0458  09/12/16 0359  09/14/16 0513  09/14/16 1618 09/14/16 1934 09/15/16 0010 09/15/16 0420 09/15/16 0844  AST 40  --  28  --  29  --   --   --   --   --   --   ALT 23  --  15*  --  10*  --   --   --   --   --   --   ALKPHOS 72  --  53  --  73  --   --   --   --   --   --   BILITOT 0.7  --  0.8  --  1.9*  --   --   --   --   --   --   PROT 6.9  --  5.2*  --  6.2*  --   --   --   --   --   --   ALBUMIN 2.8*  2.8*  < > 2.0*  < > 2.0*  < > 2.1* 2.0* 2.1* 2.0* 2.0*  < > =  values in this interval not displayed. No results for input(s): LIPASE, AMYLASE in the last 168 hours. No results for input(s): AMMONIA in the last 168 hours.  CBC:  Recent Labs Lab 09/09/16 0431 09/11/16 0458 09/12/16 0359 09/14/16 0513 09/15/16 0420  WBC 12.1* 12.8* 12.4* 17.6* 14.8*  HGB 12.8* 11.5* 9.8* 10.4* 10.0*  HCT 39.8* 36.4* 31.0* 32.5* 30.8*  MCV 80.1 81.3 80.8 79.4* 80.8  PLT 185 202 125* 139* 110*    Cardiac Enzymes:  Recent Labs Lab 09/13/16 0811 09/13/16 1237 09/13/16 1815  TROPONINI 0.05* 0.06* 0.05*    Lipid Panel:  Recent Labs Lab 09/10/16 1258 09/13/16 1237  TRIG 37 101    CBG:  Recent Labs Lab 09/14/16 1928 09/14/16 2016 09/15/16 0006 09/15/16 0336 09/15/16 0749  GLUCAP 142* 133* 155* 152* 144*    Microbiology: Results for orders placed or performed during the hospital encounter of 09/13/2016  Culture, blood (routine x 2)     Status: None (Preliminary result)   Collection Time: 09/21/2016  8:18 AM  Result Value Ref Range Status   Specimen Description BLOOD L AC  Final   Special Requests   Final    BOTTLES DRAWN AEROBIC AND ANAEROBIC Blood Culture adequate volume   Culture NO  GROWTH 4 DAYS  Final   Report Status PENDING  Incomplete  MRSA PCR Screening     Status: None   Collection Time: 09/26/2016  9:46 AM  Result Value Ref Range Status   MRSA by PCR NEGATIVE NEGATIVE Final    Comment:        The GeneXpert MRSA Assay (FDA approved for NASAL specimens only), is one component of a comprehensive MRSA colonization surveillance program. It is not intended to diagnose MRSA infection nor to guide or monitor treatment for MRSA infections.   Culture, blood (routine x 2)     Status: None (Preliminary result)   Collection Time: 09/20/2016 11:45 AM  Result Value Ref Range Status   Specimen Description BLOOD L HAND  Final   Special Requests   Final    BOTTLES DRAWN AEROBIC ONLY Blood Culture results may not be optimal due to an inadequate volume of blood received in culture bottles   Culture NO GROWTH 4 DAYS  Final   Report Status PENDING  Incomplete  Culture, respiratory (NON-Expectorated)     Status: None   Collection Time: 09/25/2016  6:32 PM  Result Value Ref Range Status   Specimen Description TRACHEAL ASPIRATE  Final   Special Requests NONE  Final   Gram Stain   Final    FEW WBC PRESENT, PREDOMINANTLY PMN MODERATE GRAM POSITIVE COCCI IN CHAINS IN PAIRS MODERATE GRAM NEGATIVE RODS    Culture   Final    Consistent with normal respiratory flora. Performed at [facility], [place] [address], [place], [place] [address]    Report Status 09/10/2016 FINAL  Final  Culture, respiratory (NON-Expectorated)     Status: None (Preliminary result)   Collection Time: 09/14/16 12:21 AM  Result Value Ref Range Status   Specimen Description TRACHEAL ASPIRATE  Final   Special Requests NONE  Final   Gram Stain   Final    ABUNDANT WBC PRESENT, PREDOMINANTLY PMN NO SQUAMOUS EPITHELIAL CELLS SEEN NO ORGANISMS SEEN    Culture   Final    CULTURE REINCUBATED FOR BETTER GROWTH Performed at [facility], [place] [address] [place], [place] [address]    Report Status  PENDING  Incomplete  Coagulation Studies: No results for input(s): LABPROT, INR in the last 72 hours.  Imaging: Dg Chest 1 View  Result Date: 09/14/2016 CLINICAL DATA:  Dyspnea, history of CHF, cardiac arrest, anoxic encephalopathy EXAM: CHEST 1 VIEW COMPARISON:  Portable chest x-ray of September 13, 2016 FINDINGS: The lungs are reasonably well inflated. Confluent alveolar opacities have worsened bilaterally with dense confluence noted along the lateral aspect of the left hemithorax. The cardiac silhouette remains enlarged and its margins indistinct. The pulmonary vascularity remains engorged. The endotracheal tube tip lies approximately 3 cm above the carina. The right internal jugular venous catheter tip projects at the cavoatrial junction. The esophagogastric tube tip cannot be clearly discerned. IMPRESSION: Slight interval worsening in the appearance of the lungs consistent with pulmonary edema and/or pneumonia. The visualized support tubes are in reasonable position. The esophagogastric tube tip is not demonstrated. Electronically Signed   By: David  Martinique M.D.   On: 09/14/2016 07:00    Medications:  I have reviewed the patient's current medications. Scheduled: . budesonide (PULMICORT) nebulizer solution  0.5 mg Nebulization BID  . chlorhexidine gluconate (MEDLINE KIT)  15 mL Mouth Rinse BID  . feeding supplement (PRO-STAT SUGAR FREE 64)  30 mL Per Tube Q4H  . heparin subcutaneous  5,000 Units Subcutaneous Q8H  . insulin aspart  2-6 Units Subcutaneous Q4H  . insulin glargine  10 Units Subcutaneous Q24H  . ipratropium-albuterol  3 mL Nebulization Q6H  . mouth rinse  15 mL Mouth Rinse 10 times per day  . pantoprazole (PROTONIX) IV  40 mg Intravenous Daily  . sennosides  5 mL Per Tube BID  . sodium chloride flush  10-40 mL Intracatheter Q12H  . sodium chloride flush  10-40 mL Intracatheter Q12H    Assessment/Plan: Patient remains unresponsive Now >72 hours post warming from hypothermia  protocol. Has been unable to tolerate coming completely off sedation.  Despite this does not fulfill criteria for brain death on exam. Off Depakote.  Prognosis remains poor due to continued multi-organ failure but patient not brain dead.     LOS: 8 days   Alexis Goodell, MD Neurology [phone] 09/15/2016  11:25 AM

## 2016-09-15 NOTE — Progress Notes (Signed)
PHARMACY - CRITICAL CARE PROGRESS NOTE  Pharmacy Consult for Amiodarone Interactions and CRRT Dosage Adjustments   No Known Allergies  Patient Measurements: Height: 6' (182.9 cm) Weight: (!) 487 lb (220.9 kg) IBW/kg (Calculated) : 77.6  Vital Signs: Temp: 98.6 F (37 C) (06/19 1000) Temp Source: Core (Comment) (06/19 0700) BP: 124/75 (06/19 1000) Pulse Rate: 137 (06/19 1000) Intake/Output from previous day: 06/18 0701 - 06/19 0700 In: 2960.4 [I.V.:2790.4; IV Piggyback:170] Out: 1314 [Urine:425] Intake/Output from this shift: Total I/O In: 3516 [I.V.:3486; NG/GT:30] Out: 20 [Other:20] Vent settings for last 24 hours: Vent Mode: PRVC FiO2 (%):  [10 %-100 %] 90 % Set Rate:  [24 bmp] 24 bmp Vt Set:  [550 mL] 550 mL PEEP:  [20 cmH20] 20 cmH20 Plateau Pressure:  [20 cmH20-38 cmH20] 21 cmH20  Labs:  Recent Labs  09/14/16 0513  09/15/16 0010 09/15/16 0030 09/15/16 0420 09/15/16 0844  WBC 17.6*  --   --   --  14.8*  --   HGB 10.4*  --   --   --  10.0*  --   HCT 32.5*  --   --   --  30.8*  --   PLT 139*  --   --   --  110*  --   CREATININE 2.28*  < > 2.06*  --  1.96*  1.94* 2.03*  MG 1.9  < >  --  1.8 1.8 1.8  PHOS 3.1  < > 2.4*  --  2.3* 2.3*  ALBUMIN 2.0*  < > 2.1*  --  2.0* 2.0*  PROT 6.2*  --   --   --   --   --   AST 29  --   --   --   --   --   ALT 10*  --   --   --   --   --   ALKPHOS 73  --   --   --   --   --   BILITOT 1.9*  --   --   --   --   --   < > = values in this interval not displayed. Estimated Creatinine Clearance: 84.9 mL/min (A) (by C-G formula based on SCr of 2.03 mg/dL (H)).   Recent Labs  09/15/16 0006 09/15/16 0336 09/15/16 0749  GLUCAP 155* 152* 144*    Assessment: 51 y/o M s/p cardiac arrest and Code ICE on ventilation, sedation, vasopressors, and CRRT.   Plan:  1. There are currently no significant drug interactions with amiodarone.   Recommendations: Amiodarone is metabolized by the cytochrome P450 system and therefore has  the potential to cause many drug interactions. Amiodarone has an average plasma half-life of 50 days (range 20 to 100 days).   There is potential for drug interactions to occur several weeks or months after stopping treatment and the onset of drug interactions may be slow after initiating amiodarone.   []  Statins: Increased risk of myopathy. Simvastatin- restrict dose to 20mg  daily. Other statins: counsel patients to report any muscle pain or weakness immediately.  []  Anticoagulants: Amiodarone can increase anticoagulant effect. Consider warfarin dose reduction. Patients should be monitored closely and the dose of anticoagulant altered accordingly, remembering that amiodarone levels take several weeks to stabilize.  []  Antiepileptics: Amiodarone can increase plasma concentration of phenytoin, the dose should be reduced. Note that small changes in phenytoin dose can result in large changes in levels. Monitor patient and counsel on signs of toxicity.  []  Beta blockers: increased  risk of bradycardia, AV block and myocardial depression. Sotalol - avoid concomitant use.  []   Calcium channel blockers (diltiazem and verapamil): increased risk of bradycardia, AV block and myocardial depression.  []   Cyclosporine: Amiodarone increases levels of cyclosporine. Reduced dose of cyclosporine is recommended.  []  Digoxin dose should be halved when amiodarone is started.  []  Diuretics: increased risk of cardiotoxicity if hypokalemia occurs.  []  Oral hypoglycemic agents (glyburide, glipizide, glimepiride): increased risk of hypoglycemia. Patient's glucose levels should be monitored closely when initiating amiodarone therapy.   []  Drugs that prolong the QT interval:  Torsades de pointes risk may be increased with concurrent use - avoid if possible.  Monitor QTc, also keep magnesium/potassium WNL if concurrent therapy can't be avoided. Marland Kitchen. Antibiotics: e.g. fluoroquinolones, erythromycin. . Antiarrhythmics: e.g.  quinidine, procainamide, disopyramide, sotalol. . Antipsychotics: e.g. phenothiazines, haloperidol.  . Lithium, tricyclic antidepressants, and methadone.  2. No medications require adjustment for CRRT at present.   Luisa Harthristy, Zaire Levesque D 09/15/2016,12:16 PM

## 2016-09-15 NOTE — Progress Notes (Signed)
Patient ID: Isaiah [name], male   DOB: 12/12/1965, 51 y.o.   MRN: [id number]   [facility] PROGRESS NOTE  COLTYN HANNING OVZ:[id number] DOB: 08-17-1965 DOA: 09/16/2016 PCP: Patient, No Pcp Per  HPI/Subjective: Patient remains on the ventilator on 90% oxygen. On multiple pressors  Objective: Vitals:   09/15/16 1200 09/15/16 1300  BP: 103/60 (!) 85/66  Pulse: (!) 125 (!) 138  Resp: (!) 24 (!) 25  Temp: 98.2 F (36.8 C) 98.2 F (36.8 C)    Filed Weights   09/12/16 0300 09/13/16 0330 09/14/16 0446  Weight: (!) 477 lb (216.4 kg) (!) 483 lb (219.1 kg) (!) 487 lb (220.9 kg)    ROS: Review of Systems  Unable to perform ROS: Critical illness   Exam: Physical Exam  HENT:  Nose: No mucosal edema.  Unable to look into mouth at this time  Eyes: Conjunctivae and lids are normal.  Pupils pinpoint  Neck: Carotid bruit is not present. No thyroid mass and no thyromegaly present.  Cardiovascular: S1 normal and S2 normal.  An irregularly irregular rhythm present. Tachycardia present.  Exam reveals distant heart sounds. Exam reveals no gallop.   No murmur heard. Pulses:      Dorsalis pedis pulses are 2+ on the right side, and 2+ on the left side.  Respiratory: No respiratory distress. He has decreased breath sounds in the right middle field, the right lower field, the left middle field and the left lower field. He has wheezes in the right middle field and the left middle field. He has rhonchi in the right lower field and the left lower field. He has no rales.  GI: Soft. Bowel sounds are normal. There is no tenderness.  Musculoskeletal:       Right knee: He exhibits swelling.       Left knee: He exhibits swelling.       Right ankle: He exhibits swelling.       Left ankle: He exhibits swelling.  Lymphadenopathy:    He has no cervical adenopathy.  Neurological: He is unresponsive.  Unresponsive to painful stimuli  Skin: No rash noted. Nails show no clubbing.  Psychiatric:  Unable  to assess at this time secondary to unresponsive      Data Reviewed: Basic Metabolic Panel:  Recent Labs Lab 09/14/16 1934 09/15/16 0010 09/15/16 0030 09/15/16 0420 09/15/16 0844 09/15/16 1227  NA 133* 135  --  135  136 137 135  K 3.9 3.9  --  3.9  3.9 3.8 3.8  CL 100* 103  --  103  103 104 102  CO2 28 27  --  26  27 26 26   GLUCOSE 151* 151*  --  155*  154* 149* 146*  BUN 31* 31*  --  29*  30* 31* 30*  CREATININE 2.09* 2.06*  --  1.96*  1.94* 2.03* 1.93*  CALCIUM 8.1* 8.2*  --  8.3*  8.2* 8.2* 8.2*  MG 1.8  --  1.8 1.8 1.8 1.8  PHOS 2.5 2.4*  --  2.3* 2.3* 2.5   Liver Function Tests:  Recent Labs Lab 09/11/16 0458  09/12/16 0359  09/14/16 0513  09/14/16 1934 09/15/16 0010 09/15/16 0420 09/15/16 0844 09/15/16 1227  AST 40  --  28  --  29  --   --   --   --   --   --   ALT 23  --  15*  --  10*  --   --   --   --   --   --  ALKPHOS 72  --  53  --  73  --   --   --   --   --   --   BILITOT 0.7  --  0.8  --  1.9*  --   --   --   --   --   --   PROT 6.9  --  5.2*  --  6.2*  --   --   --   --   --   --   ALBUMIN 2.8*  2.8*  < > 2.0*  < > 2.0*  < > 2.0* 2.1* 2.0* 2.0* 2.2*  < > = values in this interval not displayed. CBC:  Recent Labs Lab 09/09/16 0431 09/11/16 0458 09/12/16 0359 09/14/16 0513 09/15/16 0420  WBC 12.1* 12.8* 12.4* 17.6* 14.8*  HGB 12.8* 11.5* 9.8* 10.4* 10.0*  HCT 39.8* 36.4* 31.0* 32.5* 30.8*  MCV 80.1 81.3 80.8 79.4* 80.8  PLT 185 202 125* 139* 110*   Cardiac Enzymes:  Recent Labs Lab 09/13/16 0811 09/13/16 1237 09/13/16 1815  TROPONINI 0.05* 0.06* 0.05*   BNP (last 3 results)  Recent Labs  09/14/2016 0750  BNP 508.0*     CBG:  Recent Labs Lab 09/14/16 2016 09/15/16 0006 09/15/16 0336 09/15/16 0749 09/15/16 1221  GLUCAP 133* 155* 152* 144* 136*    Recent Results (from the past 240 hour(s))  Culture, blood (routine x 2)     Status: None (Preliminary result)   Collection Time: 09/23/2016  8:18 AM  Result  Value Ref Range Status   Specimen Description BLOOD L AC  Final   Special Requests   Final    BOTTLES DRAWN AEROBIC AND ANAEROBIC Blood Culture adequate volume   Culture NO GROWTH 4 DAYS  Final   Report Status PENDING  Incomplete  MRSA PCR Screening     Status: None   Collection Time: 09/06/2016  9:46 AM  Result Value Ref Range Status   MRSA by PCR NEGATIVE NEGATIVE Final    Comment:        The GeneXpert MRSA Assay (FDA approved for NASAL specimens only), is one component of a comprehensive MRSA colonization surveillance program. It is not intended to diagnose MRSA infection nor to guide or monitor treatment for MRSA infections.   Culture, blood (routine x 2)     Status: None (Preliminary result)   Collection Time: 09/12/2016 11:45 AM  Result Value Ref Range Status   Specimen Description BLOOD L HAND  Final   Special Requests   Final    BOTTLES DRAWN AEROBIC ONLY Blood Culture results may not be optimal due to an inadequate volume of blood received in culture bottles   Culture NO GROWTH 4 DAYS  Final   Report Status PENDING  Incomplete  Culture, respiratory (NON-Expectorated)     Status: None   Collection Time: 09/12/2016  6:32 PM  Result Value Ref Range Status   Specimen Description TRACHEAL ASPIRATE  Final   Special Requests NONE  Final   Gram Stain   Final    FEW WBC PRESENT, PREDOMINANTLY PMN MODERATE GRAM POSITIVE COCCI IN CHAINS IN PAIRS MODERATE GRAM NEGATIVE RODS    Culture   Final    Consistent with normal respiratory flora. Performed at [facility], [place] [address] [place], [place] [address]    Report Status 09/10/2016 FINAL  Final  Culture, respiratory (NON-Expectorated)     Status: None (Preliminary result)   Collection Time: 09/14/16 12:21 AM  Result Value  Ref Range Status   Specimen Description TRACHEAL ASPIRATE  Final   Special Requests NONE  Final   Gram Stain   Final    ABUNDANT WBC PRESENT, PREDOMINANTLY PMN NO SQUAMOUS EPITHELIAL CELLS SEEN NO  ORGANISMS SEEN    Culture   Final    CULTURE REINCUBATED FOR BETTER GROWTH Performed at [facility], [place] [address], [place], [place] [address]    Report Status PENDING  Incomplete     Studies: Dg Chest 1 View  Result Date: 09/14/2016 CLINICAL DATA:  Dyspnea, history of CHF, cardiac arrest, anoxic encephalopathy EXAM: CHEST 1 VIEW COMPARISON:  Portable chest x-ray of September 13, 2016 FINDINGS: The lungs are reasonably well inflated. Confluent alveolar opacities have worsened bilaterally with dense confluence noted along the lateral aspect of the left hemithorax. The cardiac silhouette remains enlarged and its margins indistinct. The pulmonary vascularity remains engorged. The endotracheal tube tip lies approximately 3 cm above the carina. The right internal jugular venous catheter tip projects at the cavoatrial junction. The esophagogastric tube tip cannot be clearly discerned. IMPRESSION: Slight interval worsening in the appearance of the lungs consistent with pulmonary edema and/or pneumonia. The visualized support tubes are in reasonable position. The esophagogastric tube tip is not demonstrated. Electronically Signed   By: David  Martinique M.D.   On: 09/14/2016 07:00    Scheduled Meds: . budesonide (PULMICORT) nebulizer solution  0.5 mg Nebulization BID  . chlorhexidine gluconate (MEDLINE KIT)  15 mL Mouth Rinse BID  . feeding supplement (PRO-STAT SUGAR FREE 64)  30 mL Per Tube Q4H  . heparin subcutaneous  5,000 Units Subcutaneous Q8H  . insulin aspart  2-6 Units Subcutaneous Q4H  . insulin glargine  10 Units Subcutaneous Q24H  . ipratropium-albuterol  3 mL Nebulization Q6H  . mouth rinse  15 mL Mouth Rinse 10 times per day  . pantoprazole (PROTONIX) IV  40 mg Intravenous Daily  . sennosides  5 mL Per Tube BID  . sodium chloride flush  10-40 mL Intracatheter Q12H  . sodium chloride flush  10-40 mL Intracatheter Q12H   Continuous Infusions: . sodium chloride 10 mL/hr at 09/13/16  1900  . albumin human    . amiodarone 60 mg/hr (09/15/16 1047)  . feeding supplement (VITAL HIGH PROTEIN)    . fentaNYL infusion INTRAVENOUS 200 mcg/hr (09/15/16 1047)  . norepinephrine (LEVOPHED) Adult infusion 22 mcg/min (09/15/16 1225)  . phenylephrine (NEO-SYNEPHRINE) Adult infusion 360 mcg/min (09/15/16 1316)  . pureflow 15,000 mL (09/15/16 1056)  . valproate sodium Stopped (09/15/16 1128)  . vasopressin (PITRESSIN) infusion - *FOR SHOCK* 0.03 Units/min (09/14/16 2336)    Assessment/Plan:  1. Cardiac arrest With multiorgan failure. Suspect due to arrhythmia related with an EF of 10% seen on echocardiogram.  Overall prognosis poor with anoxic encephalopathy. No significant improvement in his condition 2. 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 On milrinone.  With EF of 10% his overall prognosis is poor. Prognosis poor 3. Acute kidney injury secondary to cardiac arrest, CT scan with contrast. Continue CRRT 4. Anoxic encephalopathy with brain edema. Acute encephalopathy. Patient currently on sedation with fentanyl and versed. Previous EEG showing no seizures. Patient started on valproate sodium.  Neurology follow up.  Palliative care team following prognosis poor, pleural effusions and later today 5. Acute hypoxic respiratory failure. Patient has been on 90% FiO2 6. Hypotension- Continue Levophed  7. Type 2 diabetes mellitus. On sliding scale and low-dose Levemir. 8. Rapid Atrial fibrillation.  Continue heparin drip. Patient on amiodarone drip  9. Morbid obesity and likely sleep apnea.  Code Status:     Code Status Orders        Start     Ordered   09/12/2016 1005  Full code  Continuous     09/03/2016 1007    Code Status History    Date Active Date Inactive Code Status Order ID Comments User Context   09/23/2016  8:30 AM 09/22/2016 10:08 AM Full Code [id number]  Loletha Grayer, MD ED     Family Communication: Discussed with  mother  Consultants:  Critical care specialist  Cardiology  Neurology  Nephrology  Palliative care consultation  Time spent: 26 minutes  [facility]           googel

## 2016-09-15 NOTE — Progress Notes (Signed)
PULMONARY / CRITICAL CARE MEDICINE   Name: Isaiah [name] MRN: [id number] DOB: May 27, 1965    ADMISSION DATE:  09-13-16  PT PROFILE: 51 yo male with DM, CAF, morbid obesity, cardiomyopathy suffered out of hospital cardiac arrest requiring prolonged CPR/ACLS. Underwent hypothermia protocol. Hospitalization complicated by MODS.  MAJOR EVENTS/TEST RESULTS: 06/11 Admission after out of hospital cardiac arrest 06/11 CTA chest: No PE noted on suboptimal study 06/11 CT head: No acute abnormalities 06/11 EEG: abnormal EEG due to a burst suppression rhythm 06/11 neurology consultation 06/12 anuric renal failure, shock requiring high dose vasopressors 06/12 echocardiogram: LVEF 10% 06/13 Rewarmed. Possible seizure. CRRT initiated 06/13 CT head: cerebral edema 06/13 EEG: This is an abnormal electroencephalogram due the presence of PLEDs that are triphasic in morphology.  Although most prevalent over the left hemisphere, this activity can be noted over the right hemisphere independently and also have a more generalized distribution 06/13 per neurology consultant, prognosis for neurologic recovery deemed poor 06/14 palliative care consultation 06/15 progressive pulmonary infiltrates c/w edema vs ARDS 06/15 goals of care discussion with family. Full code desired 06/15 EEG: This is an abnormal electroencephalogram secondary to the absence of cerebral activity 06/16 worsening oxygenation. Worsening pulmonary infiltrates. Remains on high dose vasopressors. Remains on CRRT. Remains minimally responsive 06/17 worsening pulmonary infiltrates and left pleural effusion. Remains on CRRT. Remains on high-dose vasopressors. Remains minimally responsive 06/18 severe ARDS pattern on x-ray requiring high FiO2/PEEP. Remains on CRRT. On maximum dose vasopressors. Remains minimally responsive but with some spontaneous ventilatory effort 06/18 mother and father updated at bedside. Goals of care again addressed. They  were informed that he is on maximum support. Made DNR in event of cardiac arrest. To consider discontinuation of life-sustaining therapies later in the week 06/19 Tachyarrhythmias. Amiodarone increased. Milrinone stopped. Remains on maximum support with vasopressors, mech vent and CRRT   INDWELLING DEVICES:: ETT 06/11 >>  L IJ CVL 06/11 >> 06/15 R IJ HD cath 06/13 >>  RUE PICC 06/15 >>    MICRO DATA: MRSA PCR 06/11 >> NEG Resp 06/11 >> consistent with oral flora Blood 06/11 >> NEG Resp 06/18 >>   ANTIMICROBIALS:  Ceftriaxone 06/11 X 1 dose  SUBJECTIVE:  RASS -5. Continues to require fentanyl to maintain patient-ventilator synchrony. Remains unresponsive to painful stimuli.  VITAL SIGNS: BP 124/75   Pulse (!) 137   Temp 98.6 F (37 C)   Resp (!) 32   Ht 6' (1.829 m)   Wt (!) 487 lb (220.9 kg)   SpO2 100%   BMI 66.05 kg/m   HEMODYNAMICS:    VENTILATOR SETTINGS: Vent Mode: PRVC FiO2 (%):  [10 %-100 %] 90 % Set Rate:  [24 bmp] 24 bmp Vt Set:  [550 mL] 550 mL PEEP:  [20 cmH20] 20 cmH20 Plateau Pressure:  [20 cmH20-38 cmH20] 21 cmH20  INTAKE / OUTPUT: I/O last 3 completed shifts: In: 5287.1 [I.V.:5117.1; IV Piggyback:170] Out: 3264 [Urine:625; Emesis/NG output:1750; Other:889]  PHYSICAL EXAMINATION: General: Morbidly obese, fentanyl infusion, RASS -5  Neuro: Spontaneous ventilatory effort, no spontaneous movement, no response to painful stimuli HEENT: NCAT, scleral edema Cardiovascular: Distant heart sounds, IR IR, no murmurs noted Lungs: coarse bilateral breath sounds without wheezes Abdomen: Severely obese, bowel sounds not audible Ext: cool, diminished to absent pulses, symmetric edema in all 4 extremities  LABS:  BMET  Recent Labs Lab 09/15/16 0010 09/15/16 0420 09/15/16 0844  NA 135 135  136 137  K 3.9 3.9  3.9 3.8  CL 103 103  103 104  CO2 27 26  27 26   BUN 31* 29*  30* 31*  CREATININE 2.06* 1.96*  1.94* 2.03*  GLUCOSE 151* 155*  154*  149*    Electrolytes  Recent Labs Lab 09/15/16 0010 09/15/16 0030 09/15/16 0420 09/15/16 0844  CALCIUM 8.2*  --  8.3*  8.2* 8.2*  MG  --  1.8 1.8 1.8  PHOS 2.4*  --  2.3* 2.3*    CBC  Recent Labs Lab 09/12/16 0359 09/14/16 0513 09/15/16 0420  WBC 12.4* 17.6* 14.8*  HGB 9.8* 10.4* 10.0*  HCT 31.0* 32.5* 30.8*  PLT 125* 139* 110*    Coag's  Recent Labs Lab 09/10/16 0922 09/10/16 1634 09/11/16 0034  APTT 59* 63* 91*    Sepsis Markers No results for input(s): LATICACIDVEN, PROCALCITON, O2SATVEN in the last 168 hours.  ABG  Recent Labs Lab 09/13/16 0500 09/13/16 0650 09/14/16 0400  PHART 7.29* 7.22* 7.25*  PCO2ART 57* 63* 61*  PO2ART 52* UNABLE TO CALCULATE. 61*    Liver Enzymes  Recent Labs Lab 09/11/16 0458  09/12/16 0359  09/14/16 0513  09/15/16 0010 09/15/16 0420 09/15/16 0844  AST 40  --  28  --  29  --   --   --   --   ALT 23  --  15*  --  10*  --   --   --   --   ALKPHOS 72  --  53  --  73  --   --   --   --   BILITOT 0.7  --  0.8  --  1.9*  --   --   --   --   ALBUMIN 2.8*  2.8*  < > 2.0*  < > 2.0*  < > 2.1* 2.0* 2.0*  < > = values in this interval not displayed.  Cardiac Enzymes  Recent Labs Lab 09/13/16 0811 09/13/16 1237 09/13/16 1815  TROPONINI 0.05* 0.06* 0.05*    Glucose  Recent Labs Lab 09/14/16 1615 09/14/16 1928 09/14/16 2016 09/15/16 0006 09/15/16 0336 09/15/16 0749  GLUCAP 144* 142* 133* 155* 152* 144*    CXR: NNF   ASSESSMENT / PLAN:  PULMONARY A: Acute hypoxemic respiratory failure VDRF post cardiac arrest Severe ARDS versus pulmonary edema P:   Cont full vent support - settings reviewed and/or adjusted Cont vent bundle Daily SBT if/when meets criteria   CARDIOVASCULAR A:  Cardiac arrest Severe cardiomyopathy Atrial fibrillation Cardiogenic shock P:  Continue vasopressors for MAP goal > 65 mmHg Continue amiodarone infusion Discontinue milrinone DNR in event of cardiac  arrest  RENAL A:   Anuric renal failure post cardiac arrest P:   Monitor BMET intermittently Monitor I/Os Correct electrolytes as indicated Continue CRRT for now  GASTROINTESTINAL A:   Morbid obesity Gastric ileus P:   SUP: Enteral PPI Holding TF's due to gastric ileus  HEMATOLOGIC A:   Mild ICU acquired anemia without acute blood loss Mild thrombocytopenia P:  DVT px: SQ heparin Monitor CBC intermittently Transfuse per usual guidelines   INFECTIOUS A:   No definite infection identified P:   Monitor temp, WBC count Micro and abx as above   ENDOCRINE A:   Type II DM, controlled P:   Continue Lantus Continue SSI  NEUROLOGIC A:   Anoxic encephalopathy Cerebral edema  Very poor neurological prognosis P:   RASS goal: -1, -2 Maintaining fentanyl infusion for ventilator synchrony Minimize all other sedatives - propofol discontinued 06/19   FAMILY: No family at  bedside   CCM time: 35 mins The above time includes time spent in consultation with patient and/or family members and reviewing care plan on multidisciplinary rounds  Billy Fischer, MD PCCM service Mobile [phone] Pager [phone]  09/15/2016, 12:32 PM

## 2016-09-15 NOTE — Progress Notes (Signed)
[facility]  ROUNDING NOTE   Subjective:   Patient remains critically ill,intubated and sedated Requiring high dose of pressors  CRRT UF 50 cc / hr -> increased to 75 cc/hr this morning  UOP 425    Fio2 90%, PEEP 15 Vomiting - concern for aspiration   Objective:  Vital signs in last 24 hours:  Temp:  [96.8 F (36 C)-99 F (37.2 C)] 99 F (37.2 C) (06/19 0800) Pulse Rate:  [29-159] 150 (06/19 0800) Resp:  [14-36] 19 (06/19 0800) BP: (72-125)/(34-97) 115/60 (06/19 0800) SpO2:  [90 %-100 %] 100 % (06/19 0800) FiO2 (%):  [10 %-100 %] 90 % (06/19 0700)  Weight change:  Filed Weights   09/12/16 0300 09/13/16 0330 09/14/16 0446  Weight: (!) 216.4 kg (477 lb) (!) 219.1 kg (483 lb) (!) 220.9 kg (487 lb)    Intake/Output: I/O last 3 completed shifts: In: 5287.1 [I.V.:5117.1; IV Piggyback:170] Out: 1856 [Urine:625; Emesis/NG output:1750; Other:889]   Intake/Output this shift:  Total I/O In: -  Out: 20 [Other:20]  Physical Exam: General: Critically ill   H, ENT ETT  Eyes: Eyes closed. Conjunctival edema - worse today  Neck: No masses  Lungs:  Diminished, vent assisted  Heart: Irregular, tachycardia  Abdomen:  Soft, nontender, obese  Extremities: + dependent  peripheral edema; anasarca  Neurologic: Intubated, sedated  Skin: No acute rashes  Access: RIJ temp HD catheter 6/13 Dr. Ashby Dawes    Basic Metabolic Panel:  Recent Labs Lab 09/14/16 1230 09/14/16 1231 09/14/16 1618 09/14/16 1934 09/15/16 0010 09/15/16 0030 09/15/16 0420  NA 132*  --  133* 133* 135  --  135  136  K 3.9  --  3.8 3.9 3.9  --  3.9  3.9  CL 101  --  101 100* 103  --  103  103  CO2 27  --  27 28 27   --  26  27  GLUCOSE 153*  --  146* 151* 151*  --  155*  154*  BUN 33*  --  32* 31* 31*  --  29*  30*  CREATININE 2.22*  --  2.15* 2.09* 2.06*  --  1.96*  1.94*  CALCIUM 8.1*  --  8.2* 8.1* 8.2*  --  8.3*  8.2*  MG  --  1.8 1.9 1.8  --  1.8 1.8  PHOS 2.8  --  2.5 2.5  2.4*  --  2.3*    Liver Function Tests:  Recent Labs Lab 09/11/16 0458  09/12/16 0359  09/14/16 0513  09/14/16 1230 09/14/16 1618 09/14/16 1934 09/15/16 0010 09/15/16 0420  AST 40  --  28  --  29  --   --   --   --   --   --   ALT 23  --  15*  --  10*  --   --   --   --   --   --   ALKPHOS 72  --  53  --  73  --   --   --   --   --   --   BILITOT 0.7  --  0.8  --  1.9*  --   --   --   --   --   --   PROT 6.9  --  5.2*  --  6.2*  --   --   --   --   --   --   ALBUMIN 2.8*  2.8*  < > 2.0*  < >  2.0*  < > 2.0* 2.1* 2.0* 2.1* 2.0*  < > = values in this interval not displayed. No results for input(s): LIPASE, AMYLASE in the last 168 hours. No results for input(s): AMMONIA in the last 168 hours.  CBC:  Recent Labs Lab 09/09/16 0431 09/11/16 0458 09/12/16 0359 09/14/16 0513 09/15/16 0420  WBC 12.1* 12.8* 12.4* 17.6* 14.8*  HGB 12.8* 11.5* 9.8* 10.4* 10.0*  HCT 39.8* 36.4* 31.0* 32.5* 30.8*  MCV 80.1 81.3 80.8 79.4* 80.8  PLT 185 202 125* 139* 110*    Cardiac Enzymes:  Recent Labs Lab 09/13/16 0811 09/13/16 1237 09/13/16 1815  TROPONINI 0.05* 0.06* 0.05*    BNP: Invalid input(s): POCBNP  CBG:  Recent Labs Lab 09/14/16 1928 09/14/16 2016 09/15/16 0006 09/15/16 0336 09/15/16 0749  GLUCAP 142* 133* 155* 152* 144*    Microbiology: Results for orders placed or performed during the hospital encounter of 09/17/2016  Culture, blood (routine x 2)     Status: None (Preliminary result)   Collection Time: 09/05/2016  8:18 AM  Result Value Ref Range Status   Specimen Description BLOOD L AC  Final   Special Requests   Final    BOTTLES DRAWN AEROBIC AND ANAEROBIC Blood Culture adequate volume   Culture NO GROWTH 4 DAYS  Final   Report Status PENDING  Incomplete  MRSA PCR Screening     Status: None   Collection Time: 09/12/2016  9:46 AM  Result Value Ref Range Status   MRSA by PCR NEGATIVE NEGATIVE Final    Comment:        The GeneXpert MRSA Assay (FDA approved for  NASAL specimens only), is one component of a comprehensive MRSA colonization surveillance program. It is not intended to diagnose MRSA infection nor to guide or monitor treatment for MRSA infections.   Culture, blood (routine x 2)     Status: None (Preliminary result)   Collection Time: 09/24/2016 11:45 AM  Result Value Ref Range Status   Specimen Description BLOOD L HAND  Final   Special Requests   Final    BOTTLES DRAWN AEROBIC ONLY Blood Culture results may not be optimal due to an inadequate volume of blood received in culture bottles   Culture NO GROWTH 4 DAYS  Final   Report Status PENDING  Incomplete  Culture, respiratory (NON-Expectorated)     Status: None   Collection Time: 09/21/2016  6:32 PM  Result Value Ref Range Status   Specimen Description TRACHEAL ASPIRATE  Final   Special Requests NONE  Final   Gram Stain   Final    FEW WBC PRESENT, PREDOMINANTLY PMN MODERATE GRAM POSITIVE COCCI IN CHAINS IN PAIRS MODERATE GRAM NEGATIVE RODS    Culture   Final    Consistent with normal respiratory flora. Performed at [facility], [place] [address], [place], [place] [address]    Report Status 09/10/2016 FINAL  Final  Culture, respiratory (NON-Expectorated)     Status: None (Preliminary result)   Collection Time: 09/14/16 12:21 AM  Result Value Ref Range Status   Specimen Description TRACHEAL ASPIRATE  Final   Special Requests NONE  Final   Gram Stain   Final    ABUNDANT WBC PRESENT, PREDOMINANTLY PMN NO SQUAMOUS EPITHELIAL CELLS SEEN NO ORGANISMS SEEN Performed at [facility], [place] [address] [place], [place] [address]    Culture PENDING  Incomplete   Report Status PENDING  Incomplete    Coagulation Studies: No results for input(s): LABPROT, INR in  the last 72 hours.  Urinalysis: No results for input(s): COLORURINE, LABSPEC, PHURINE, GLUCOSEU, HGBUR, BILIRUBINUR, KETONESUR, PROTEINUR, UROBILINOGEN, NITRITE, LEUKOCYTESUR in the last 72 hours.  Invalid  input(s): APPERANCEUR    Imaging: Dg Chest 1 View  Result Date: 09/14/2016 CLINICAL DATA:  Dyspnea, history of CHF, cardiac arrest, anoxic encephalopathy EXAM: CHEST 1 VIEW COMPARISON:  Portable chest x-ray of September 13, 2016 FINDINGS: The lungs are reasonably well inflated. Confluent alveolar opacities have worsened bilaterally with dense confluence noted along the lateral aspect of the left hemithorax. The cardiac silhouette remains enlarged and its margins indistinct. The pulmonary vascularity remains engorged. The endotracheal tube tip lies approximately 3 cm above the carina. The right internal jugular venous catheter tip projects at the cavoatrial junction. The esophagogastric tube tip cannot be clearly discerned. IMPRESSION: Slight interval worsening in the appearance of the lungs consistent with pulmonary edema and/or pneumonia. The visualized support tubes are in reasonable position. The esophagogastric tube tip is not demonstrated. Electronically Signed   By: David  Martinique M.D.   On: 09/14/2016 07:00     Medications:   . sodium chloride 10 mL/hr at 09/13/16 1900  . albumin human    . amiodarone 60 mg/hr (09/15/16 0433)  . feeding supplement (VITAL HIGH PROTEIN)    . fentaNYL infusion INTRAVENOUS 150 mcg/hr (09/15/16 0824)  . norepinephrine (LEVOPHED) Adult infusion 38 mcg/min (09/15/16 0518)  . phenylephrine (NEO-SYNEPHRINE) Adult infusion 360 mcg/min (09/15/16 0600)  . pureflow 2,000 mL/hr at 09/15/16 0116  . valproate sodium Stopped (09/15/16 0234)  . vasopressin (PITRESSIN) infusion - *FOR SHOCK* 0.03 Units/min (09/14/16 2336)   . budesonide (PULMICORT) nebulizer solution  0.5 mg Nebulization BID  . chlorhexidine gluconate (MEDLINE KIT)  15 mL Mouth Rinse BID  . feeding supplement (PRO-STAT SUGAR FREE 64)  30 mL Per Tube Q4H  . heparin subcutaneous  5,000 Units Subcutaneous Q8H  . insulin aspart  2-6 Units Subcutaneous Q4H  . insulin glargine  10 Units Subcutaneous Q24H  .  ipratropium-albuterol  3 mL Nebulization Q6H  . mouth rinse  15 mL Mouth Rinse 10 times per day  . pantoprazole sodium  40 mg Per Tube Daily  . sennosides  5 mL Per Tube BID  . sodium chloride flush  10-40 mL Intracatheter Q12H  . sodium chloride flush  10-40 mL Intracatheter Q12H   acetaminophen **OR** acetaminophen, fentaNYL, heparin, polyvinyl alcohol, sodium chloride flush, sodium chloride flush  Assessment/ Plan:  Mr. Isaiah [name] is a 51 y.o. black male with diabetes mellitus type II, hyeprtension, congestive heart failure, atrial fibrillation,  who was admitted to [facility] on 09/08/2016 for Cardiac arrest ([place]) [I46.9]  1. Acute renal failure: on chronic kidney disease stage III baseline creatinine of 1.4 04/10/16.  Nonoliguric. Iv contrast exposure for CT angiogram at admission On CRRT 4K bath. Increase UF from 34m/hr to 75 then eventually 100 mL/hr  - continue iv Albumin - Monitor renal function, volume status, urine output and electrolytes.    2. Cardiogenic shock with hypotension, with anasarca: on vasopressors:   Supportive care Start UF with iv albumin Obligate intake about 200 cc /hr  3. Acute Respiratory Failure: intubated and sedated.  FiO2 90%      LOS: 8 Rocio Roam 6/19/20189:07 AM

## 2016-09-15 NOTE — Plan of Care (Signed)
Problem: Physical Regulation: Goal: Ability to maintain clinical measurements within normal limits will improve Outcome: Progressing Patient is on multiple vasopressors to keep blood pressure at a MAP of 65 or greater.  Problem: Skin Integrity: Goal: Risk for impaired skin integrity will decrease Outcome: Progressing Repositioned every two hours. Heels elevated off bed.  Problem: Fluid Volume: Goal: Ability to maintain a balanced intake and output will improve Outcome: Progressing Patient is on CRRT. Monitoring u/o closely from foley.  Problem: Nutrition: Goal: Adequate nutrition will be maintained Outcome: Not Progressing Currently NPO. On LIS from OG tube.   Problem: Bowel/Gastric: Goal: Will not experience complications related to bowel motility Outcome: Not Progressing LBM 01-Nov-2016. Given stool softernes.

## 2016-09-15 NOTE — Plan of Care (Signed)
Problem: Pain Managment: Goal: General experience of comfort will improve Outcome: Progressing Pain assessed and addressed every four hours.  Fentanyl gtt titrated as needed.  Problem: Skin Integrity: Goal: Risk for impaired skin integrity will decrease Outcome: Progressing Patient turned every two hours to prevent skin impairment.  Heels floated.  Skin assessments completed every four hours.

## 2016-09-15 NOTE — Progress Notes (Signed)
Nutrition Follow-up  DOCUMENTATION CODES:   Morbid obesity  INTERVENTION:  1. Continue to monitor for GOC 2. Hold TFs due to gastric ileus  NUTRITION DIAGNOSIS:   Inadequate oral intake related to inability to eat (sedated on vent) as evidenced by NPO status. -ongoing  GOAL:   Provide needs based on ASPEN/SCCM guidelines -not meeting, gastric ileus, not a candidate for TPN  MONITOR:   Vent status, Labs, Weight trends, I & O's  ASSESSMENT:   51 y/o morbidly obese male with h/o DM, CHF, admitted for cardiac arrest of unclear etiology.  Patient is currently intubated on ventilator support MV: 16.8 L/min Temp (24hrs), Avg:97.4 F (36.3 C), Min:96.8 F (36 C), Max:99 F (37.2 C) Propofol: None Continues to remain on multiple pressors, critically ill Family to consider discontinuation of life-sustaining therapies later this week Also has Gastric ileus causing reflux of bile, requiring constant suction by RN  Intake/Output Summary (Last 24 hours) at 09/15/16 1322 Last data filed at 09/15/16 1000  Gross per 24 hour  Intake          4876.73 ml  Output             1089 ml  Net          3787.73 ml   CRRT Labs and medications reviewed  Diet Order:     Skin:  Reviewed, no issues  Last BM:  none since admit   Height:   Ht Readings from Last 1 Encounters:  05-24-16 6' (1.829 m)    Weight:   Wt Readings from Last 1 Encounters:  09/14/16 (!) 487 lb (220.9 kg)    Ideal Body Weight:  80.9 kg  BMI:  Body mass index is 66.05 kg/m.  Estimated Nutritional Needs:   Kcal:  1800-2000kcal/day (22-25kcal/day IBW)  Protein:  >200g/day   Fluid:  411ml/kcal/day  EDUCATION NEEDS:   Education needs no appropriate at this time  Dionne AnoWilliam M. Etta Gassett, MS, RD LDN Inpatient Clinical Dietitian Pager [phone]

## 2016-09-16 ENCOUNTER — Inpatient Hospital Stay: Payer: BLUE CROSS/BLUE SHIELD

## 2016-09-16 DIAGNOSIS — Z9911 Dependence on respirator [ventilator] status: Secondary | ICD-10-CM

## 2016-09-16 DIAGNOSIS — J9611 Chronic respiratory failure with hypoxia: Secondary | ICD-10-CM

## 2016-09-16 DIAGNOSIS — J811 Chronic pulmonary edema: Secondary | ICD-10-CM

## 2016-09-16 LAB — CULTURE, RESPIRATORY: CULTURE: NO GROWTH

## 2016-09-16 LAB — RENAL FUNCTION PANEL
ANION GAP: 6 (ref 5–15)
ANION GAP: 6 (ref 5–15)
Albumin: 2.3 g/dL — ABNORMAL LOW (ref 3.5–5.0)
Albumin: 2.3 g/dL — ABNORMAL LOW (ref 3.5–5.0)
Albumin: 2.4 g/dL — ABNORMAL LOW (ref 3.5–5.0)
Albumin: 2.4 g/dL — ABNORMAL LOW (ref 3.5–5.0)
Anion gap: 6 (ref 5–15)
Anion gap: 6 (ref 5–15)
BUN: 29 mg/dL — ABNORMAL HIGH (ref 6–20)
BUN: 29 mg/dL — ABNORMAL HIGH (ref 6–20)
BUN: 28 mg/dL — ABNORMAL HIGH (ref 6–20)
BUN: 29 mg/dL — ABNORMAL HIGH (ref 6–20)
CHLORIDE: 104 mmol/L (ref 101–111)
CHLORIDE: 102 mmol/L (ref 101–111)
CO2: 27 mmol/L (ref 22–32)
CO2: 25 mmol/L (ref 22–32)
CO2: 27 mmol/L (ref 22–32)
CO2: 26 mmol/L (ref 22–32)
CREATININE: 1.81 mg/dL — AB (ref 0.61–1.24)
Calcium: 8.5 mg/dL — ABNORMAL LOW (ref 8.9–10.3)
Calcium: 8.3 mg/dL — ABNORMAL LOW (ref 8.9–10.3)
Calcium: 8.5 mg/dL — ABNORMAL LOW (ref 8.9–10.3)
Calcium: 8.7 mg/dL — ABNORMAL LOW (ref 8.9–10.3)
Chloride: 102 mmol/L (ref 101–111)
Chloride: 106 mmol/L (ref 101–111)
Creatinine, Ser: 1.79 mg/dL — ABNORMAL HIGH (ref 0.61–1.24)
Creatinine, Ser: 1.72 mg/dL — ABNORMAL HIGH (ref 0.61–1.24)
Creatinine, Ser: 1.8 mg/dL — ABNORMAL HIGH (ref 0.61–1.24)
GFR calc Af Amer: 52 mL/min — ABNORMAL LOW (ref >60)
GFR calc Af Amer: 50 mL/min — ABNORMAL LOW (ref >60)
GFR calc non Af Amer: 43 mL/min — ABNORMAL LOW (ref >60)
GFR calc non Af Amer: 45 mL/min — ABNORMAL LOW (ref >60)
GFR calc non Af Amer: 43 mL/min — ABNORMAL LOW (ref >60)
GFR, EST AFRICAN AMERICAN: 49 mL/min — AB (ref >60)
GFR, EST AFRICAN AMERICAN: 50 mL/min — AB (ref >60)
GFR, EST NON AFRICAN AMERICAN: 43 mL/min — AB (ref >60)
Glucose, Bld: 133 mg/dL — ABNORMAL HIGH (ref 65–99)
Glucose, Bld: 122 mg/dL — ABNORMAL HIGH (ref 65–99)
Glucose, Bld: 88 mg/dL (ref 65–99)
Glucose, Bld: 86 mg/dL (ref 65–99)
Phosphorus: 2.7 mg/dL (ref 2.5–4.6)
Phosphorus: 2.5 mg/dL (ref 2.5–4.6)
Phosphorus: 2.5 mg/dL (ref 2.5–4.6)
Phosphorus: 2.3 mg/dL — ABNORMAL LOW (ref 2.5–4.6)
Potassium: 4 mmol/L (ref 3.5–5.1)
Potassium: 3.7 mmol/L (ref 3.5–5.1)
Potassium: 3.8 mmol/L (ref 3.5–5.1)
Potassium: 3.9 mmol/L (ref 3.5–5.1)
Sodium: 137 mmol/L (ref 135–145)
Sodium: 133 mmol/L — ABNORMAL LOW (ref 135–145)
Sodium: 135 mmol/L (ref 135–145)
Sodium: 138 mmol/L (ref 135–145)

## 2016-09-16 LAB — CULTURE, BLOOD (ROUTINE X 2)
Culture: NO GROWTH
Culture: NO GROWTH
SPECIAL REQUESTS: ADEQUATE

## 2016-09-16 LAB — MAGNESIUM
MAGNESIUM: 1.8 mg/dL (ref 1.7–2.4)
Magnesium: 1.8 mg/dL (ref 1.7–2.4)
Magnesium: 1.7 mg/dL (ref 1.7–2.4)
Magnesium: 1.8 mg/dL (ref 1.7–2.4)
Magnesium: 1.8 mg/dL (ref 1.7–2.4)

## 2016-09-16 LAB — GLUCOSE, CAPILLARY
GLUCOSE-CAPILLARY: 120 mg/dL — AB (ref 65–99)
GLUCOSE-CAPILLARY: 122 mg/dL — AB (ref 65–99)
GLUCOSE-CAPILLARY: 135 mg/dL — AB (ref 65–99)
GLUCOSE-CAPILLARY: 87 mg/dL (ref 65–99)
Glucose-Capillary: 103 mg/dL — ABNORMAL HIGH (ref 65–99)
Glucose-Capillary: 80 mg/dL (ref 65–99)
Glucose-Capillary: 75 mg/dL (ref 65–99)

## 2016-09-16 LAB — COMPREHENSIVE METABOLIC PANEL
ALBUMIN: 2.4 g/dL — AB (ref 3.5–5.0)
ALT: 11 U/L — AB (ref 17–63)
AST: 26 U/L (ref 15–41)
Alkaline Phosphatase: 69 U/L (ref 38–126)
Anion gap: 7 (ref 5–15)
BUN: 29 mg/dL — AB (ref 6–20)
CHLORIDE: 103 mmol/L (ref 101–111)
CO2: 27 mmol/L (ref 22–32)
CREATININE: 2 mg/dL — AB (ref 0.61–1.24)
Calcium: 8.4 mg/dL — ABNORMAL LOW (ref 8.9–10.3)
GFR calc Af Amer: 44 mL/min — ABNORMAL LOW (ref >60)
GFR, EST NON AFRICAN AMERICAN: 38 mL/min — AB (ref >60)
GLUCOSE: 137 mg/dL — AB (ref 65–99)
POTASSIUM: 4 mmol/L (ref 3.5–5.1)
Sodium: 137 mmol/L (ref 135–145)
Total Bilirubin: 2.7 mg/dL — ABNORMAL HIGH (ref 0.3–1.2)
Total Protein: 6.3 g/dL — ABNORMAL LOW (ref 6.5–8.1)

## 2016-09-16 LAB — CBC
HEMATOCRIT: 30.9 % — AB (ref 40.0–52.0)
Hemoglobin: 10 g/dL — ABNORMAL LOW (ref 13.0–18.0)
MCH: 25.5 pg — ABNORMAL LOW (ref 26.0–34.0)
MCHC: 32.3 g/dL (ref 32.0–36.0)
MCV: 79 fL — AB (ref 80.0–100.0)
PLATELETS: 106 10*3/uL — AB (ref 150–440)
RBC: 3.91 MIL/uL — AB (ref 4.40–5.90)
RDW: 18.3 % — AB (ref 11.5–14.5)
WBC: 16 10*3/uL — AB (ref 3.8–10.6)

## 2016-09-16 MED ORDER — PANTOPRAZOLE SODIUM 40 MG PO PACK
40.0000 mg | PACK | Freq: Every day | ORAL | Status: DC
  Administered 2016-09-17 – 2016-09-19 (×3): 40 mg
  Filled 2016-09-16 (×3): qty 20

## 2016-09-16 MED ORDER — DEXTROSE 10 % IV SOLN
INTRAVENOUS | Status: DC
  Administered 2016-09-16 – 2016-09-18 (×2): via INTRAVENOUS

## 2016-09-16 MED ORDER — INSULIN GLARGINE 100 UNIT/ML ~~LOC~~ SOLN
10.0000 [IU] | Freq: Every day | SUBCUTANEOUS | Status: DC
  Administered 2016-09-16 – 2016-09-19 (×4): 10 [IU] via SUBCUTANEOUS
  Filled 2016-09-16 (×5): qty 0.1

## 2016-09-16 MED ORDER — INSULIN ASPART 100 UNIT/ML ~~LOC~~ SOLN
0.0000 [IU] | SUBCUTANEOUS | Status: DC
  Administered 2016-09-18 – 2016-09-19 (×6): 2 [IU] via SUBCUTANEOUS
  Filled 2016-09-16 (×6): qty 1

## 2016-09-16 MED ORDER — AMIODARONE HCL 200 MG PO TABS
400.0000 mg | ORAL_TABLET | Freq: Two times a day (BID) | ORAL | Status: DC
  Administered 2016-09-16 – 2016-09-18 (×4): 400 mg
  Filled 2016-09-16 (×4): qty 2

## 2016-09-16 NOTE — Progress Notes (Signed)
PHARMACY - CRITICAL CARE PROGRESS NOTE  Pharmacy Consult for Amiodarone Interactions and CRRT Dosage Adjustments   No Known Allergies  Patient Measurements: Height: 6' (182.9 cm) Weight: (!) 487 lb (220.9 kg) IBW/kg (Calculated) : 77.6  Vital Signs: Temp: 96.8 F (36 C) (06/20 0700) BP: 113/87 (06/20 0700) Pulse Rate: 30 (06/20 0700) Intake/Output from previous day: 06/19 0701 - 06/20 0700 In: 6783.5 [I.V.:6473.5; NG/GT:90; IV Piggyback:220] Out: 2202 [Urine:130; Emesis/NG output:250] Intake/Output from this shift: Total I/O In: 30 [NG/GT:30] Out: -  Vent settings for last 24 hours: Vent Mode: PRVC FiO2 (%):  [65 %-90 %] 65 % Set Rate:  [24 bmp] 24 bmp Vt Set:  [550 mL] 550 mL PEEP:  [5 cmH20-15 cmH20] 15 cmH20 Plateau Pressure:  [29 cmH20-37 cmH20] 37 cmH20  Labs:  Recent Labs  09/14/16 0513  09/15/16 0420  09/15/16 1638 09/15/16 2131 09/16/16 0100 09/16/16 0419  WBC 17.6*  --  14.8*  --   --   --   --  16.0*  HGB 10.4*  --  10.0*  --   --   --   --  10.0*  HCT 32.5*  --  30.8*  --   --   --   --  30.9*  PLT 139*  --  110*  --   --   --   --  106*  CREATININE 2.28*  < > 1.96*  1.94*  < > 1.88* 1.93*  --  1.81*  2.00*  MG 1.9  < > 1.8  < > 1.8 1.8 1.8 1.8  PHOS 3.1  < > 2.3*  < > 2.5 2.8  --  2.7  ALBUMIN 2.0*  < > 2.0*  < > 2.2* 2.3*  --  2.3*  2.4*  PROT 6.2*  --   --   --   --   --   --  6.3*  AST 29  --   --   --   --   --   --  26  ALT 10*  --   --   --   --   --   --  11*  ALKPHOS 73  --   --   --   --   --   --  69  BILITOT 1.9*  --   --   --   --   --   --  2.7*  < > = values in this interval not displayed. Estimated Creatinine Clearance: 95.2 mL/min (A) (by C-G formula based on SCr of 1.81 mg/dL (H)).   Recent Labs  09/16/16 0034 09/16/16 0427 09/16/16 0726  GLUCAP 120* 122* 135*    Assessment: 51 y/o M s/p cardiac arrest and Code ICE on ventilation, sedation, vasopressors, and CRRT.   Plan:  1. There are currently no significant drug  interactions with amiodarone.   Recommendations: Amiodarone is metabolized by the cytochrome P450 system and therefore has the potential to cause many drug interactions. Amiodarone has an average plasma half-life of 50 days (range 20 to 100 days).   There is potential for drug interactions to occur several weeks or months after stopping treatment and the onset of drug interactions may be slow after initiating amiodarone.   []  Statins: Increased risk of myopathy. Simvastatin- restrict dose to 20mg  daily. Other statins: counsel patients to report any muscle pain or weakness immediately.  []  Anticoagulants: Amiodarone can increase anticoagulant effect. Consider warfarin dose reduction. Patients should be monitored closely and the dose of  anticoagulant altered accordingly, remembering that amiodarone levels take several weeks to stabilize.  []  Antiepileptics: Amiodarone can increase plasma concentration of phenytoin, the dose should be reduced. Note that small changes in phenytoin dose can result in large changes in levels. Monitor patient and counsel on signs of toxicity.  []  Beta blockers: increased risk of bradycardia, AV block and myocardial depression. Sotalol - avoid concomitant use.  []   Calcium channel blockers (diltiazem and verapamil): increased risk of bradycardia, AV block and myocardial depression.  []   Cyclosporine: Amiodarone increases levels of cyclosporine. Reduced dose of cyclosporine is recommended.  []  Digoxin dose should be halved when amiodarone is started.  []  Diuretics: increased risk of cardiotoxicity if hypokalemia occurs.  []  Oral hypoglycemic agents (glyburide, glipizide, glimepiride): increased risk of hypoglycemia. Patient's glucose levels should be monitored closely when initiating amiodarone therapy.   []  Drugs that prolong the QT interval:  Torsades de pointes risk may be increased with concurrent use - avoid if possible.  Monitor QTc, also keep  magnesium/potassium WNL if concurrent therapy can't be avoided. Marland Kitchen. Antibiotics: e.g. fluoroquinolones, erythromycin. . Antiarrhythmics: e.g. quinidine, procainamide, disopyramide, sotalol. . Antipsychotics: e.g. phenothiazines, haloperidol.  . Lithium, tricyclic antidepressants, and methadone.  2. No medications require adjustment for CRRT at present.   Luisa HartChristy, Iker Nuttall D 09/16/2016,11:06 AM

## 2016-09-16 NOTE — Progress Notes (Signed)
[facility]  ROUNDING NOTE   Subjective:   Patient remains critically ill,intubated and sedated Requiring high dose of pressors  Tolerated CRRT UF 100 cc / hr  UOP 130 cc   Fio2 65%, PEEP 15  Drips Vasopressin neosynephrone Fentanyl Amiodarone Obligate intake about 150 cc /hr   Objective:  Vital signs in last 24 hours:  Temp:  [96.6 F (35.9 C)-98.8 F (37.1 C)] 96.8 F (36 C) (06/20 0700) Pulse Rate:  [30-151] 30 (06/20 0700) Resp:  [11-32] 25 (06/20 0700) BP: (85-130)/(56-99) 113/87 (06/20 0700) SpO2:  [92 %-100 %] 95 % (06/20 0825) FiO2 (%):  [65 %-90 %] 65 % (06/20 0825) Weight:  [220.9 kg (487 lb)-221.8 kg (489 lb)] 220.9 kg (487 lb) (06/20 0500)  Weight change:  Filed Weights   09/14/16 0446 09/15/16 1300 09/16/16 0500  Weight: (!) 220.9 kg (487 lb) (!) 221.8 kg (489 lb) (!) 220.9 kg (487 lb)    Intake/Output: I/O last 3 completed shifts: In: 7030.9 [I.V.:6720.9; NG/GT:90; IV Piggyback:220] Out: 2907 [Urine:355; Emesis/NG output:250; Other:2302]   Intake/Output this shift:  Total I/O In: 30 [NG/GT:30] Out: -   Physical Exam: General: Critically ill   H, ENT ETT  Eyes: Eyes closed. Conjunctival edema with conjunctival hemorrhage  Neck: No masses  Lungs:  Diminished, vent assisted  Heart: Irregular, tachycardia  Abdomen:  Soft, nontender, obese  Extremities: + dependent  peripheral edema; anasarca  Neurologic: Intubated, sedated  Skin: No acute rashes  Access: RIJ temp HD catheter 6/13 Dr. Ashby Dawes    Basic Metabolic Panel:  Recent Labs Lab 09/15/16 0844 09/15/16 1227 09/15/16 1638 09/15/16 2131 09/16/16 0100 09/16/16 0419  NA 137 135 134* 137  --  137  137  K 3.8 3.8 3.8 3.9  --  4.0  4.0  CL 104 102 102 104  --  104  103  CO2 26 26 26 27   --  27  27  GLUCOSE 149* 146* 136* 127*  --  133*  137*  BUN 31* 30* 30* 30*  --  29*  29*  CREATININE 2.03* 1.93* 1.88* 1.93*  --  1.81*  2.00*  CALCIUM 8.2* 8.2* 8.3*  8.4*  --  8.5*  8.4*  MG 1.8 1.8 1.8 1.8 1.8 1.8  PHOS 2.3* 2.5 2.5 2.8  --  2.7    Liver Function Tests:  Recent Labs Lab 09/11/16 0458  09/12/16 0359  09/14/16 0513  09/15/16 0844 09/15/16 1227 09/15/16 1638 09/15/16 2131 09/16/16 0419  AST 40  --  28  --  29  --   --   --   --   --  26  ALT 23  --  15*  --  10*  --   --   --   --   --  11*  ALKPHOS 72  --  53  --  73  --   --   --   --   --  69  BILITOT 0.7  --  0.8  --  1.9*  --   --   --   --   --  2.7*  PROT 6.9  --  5.2*  --  6.2*  --   --   --   --   --  6.3*  ALBUMIN 2.8*  2.8*  < > 2.0*  < > 2.0*  < > 2.0* 2.2* 2.2* 2.3* 2.3*  2.4*  < > = values in this interval not displayed. No results for input(s):  LIPASE, AMYLASE in the last 168 hours. No results for input(s): AMMONIA in the last 168 hours.  CBC:  Recent Labs Lab 09/11/16 0458 09/12/16 0359 09/14/16 0513 09/15/16 0420 09/16/16 0419  WBC 12.8* 12.4* 17.6* 14.8* 16.0*  HGB 11.5* 9.8* 10.4* 10.0* 10.0*  HCT 36.4* 31.0* 32.5* 30.8* 30.9*  MCV 81.3 80.8 79.4* 80.8 79.0*  PLT 202 125* 139* 110* 106*    Cardiac Enzymes:  Recent Labs Lab 09/13/16 0811 09/13/16 1237 09/13/16 1815  TROPONINI 0.05* 0.06* 0.05*    BNP: Invalid input(s): POCBNP  CBG:  Recent Labs Lab 09/15/16 1614 09/15/16 1924 09/16/16 0034 09/16/16 0427 09/16/16 0726  GLUCAP 115* 120* 120* 122* 135*    Microbiology: Results for orders placed or performed during the hospital encounter of 09/13/2016  Culture, blood (routine x 2)     Status: None   Collection Time: 09/01/2016  8:18 AM  Result Value Ref Range Status   Specimen Description BLOOD L AC  Final   Special Requests   Final    BOTTLES DRAWN AEROBIC AND ANAEROBIC Blood Culture adequate volume   Culture NO GROWTH 9 DAYS  Final   Report Status 09/16/2016 FINAL  Final  MRSA PCR Screening     Status: None   Collection Time: 08/28/2016  9:46 AM  Result Value Ref Range Status   MRSA by PCR NEGATIVE NEGATIVE Final     Comment:        The GeneXpert MRSA Assay (FDA approved for NASAL specimens only), is one component of a comprehensive MRSA colonization surveillance program. It is not intended to diagnose MRSA infection nor to guide or monitor treatment for MRSA infections.   Culture, blood (routine x 2)     Status: None   Collection Time: 09/06/2016 11:45 AM  Result Value Ref Range Status   Specimen Description BLOOD L HAND  Final   Special Requests   Final    BOTTLES DRAWN AEROBIC ONLY Blood Culture results may not be optimal due to an inadequate volume of blood received in culture bottles   Culture NO GROWTH 9 DAYS  Final   Report Status 09/16/2016 FINAL  Final  Culture, respiratory (NON-Expectorated)     Status: None   Collection Time: 09/01/2016  6:32 PM  Result Value Ref Range Status   Specimen Description TRACHEAL ASPIRATE  Final   Special Requests NONE  Final   Gram Stain   Final    FEW WBC PRESENT, PREDOMINANTLY PMN MODERATE GRAM POSITIVE COCCI IN CHAINS IN PAIRS MODERATE GRAM NEGATIVE RODS    Culture   Final    Consistent with normal respiratory flora. Performed at [facility], [place] [address] [place], [place] [address]    Report Status 09/10/2016 FINAL  Final  Culture, respiratory (NON-Expectorated)     Status: None (Preliminary result)   Collection Time: 09/14/16 12:21 AM  Result Value Ref Range Status   Specimen Description TRACHEAL ASPIRATE  Final   Special Requests NONE  Final   Gram Stain   Final    ABUNDANT WBC PRESENT, PREDOMINANTLY PMN NO SQUAMOUS EPITHELIAL CELLS SEEN NO ORGANISMS SEEN    Culture   Final    CULTURE REINCUBATED FOR BETTER GROWTH Performed at [facility], [place] [address], [place], [place] [address]    Report Status PENDING  Incomplete    Coagulation Studies: No results for input(s): LABPROT, INR in the last 72 hours.  Urinalysis: No results for input(s): COLORURINE, LABSPEC, [place], [place], [place], [place],  KETONESUR, PROTEINUR,  UROBILINOGEN, NITRITE, LEUKOCYTESUR in the last 72 hours.  Invalid input(s): APPERANCEUR    Imaging: Dg Chest Port 1 View  Result Date: 09/16/2016 CLINICAL DATA:  Respiratory failure. EXAM: PORTABLE CHEST 1 VIEW COMPARISON:  09/14/2016 FINDINGS: Image quality is degraded by patient body habitus. Endotracheal tube terminates at the level of the clavicles, 6.5 cm above the carina. Enteric tube courses towards the upper abdomen with tip not imaged. Right jugular catheter terminates over the high right atrium. The cardiac silhouette is largely obscured. Diffuse bilateral airspace opacities have mildly worsened, most notably in the right lung base. Multiple areas of dense consolidation are again seen with some air bronchograms. No pneumothorax is identified. IMPRESSION: Mild worsening of diffuse bilateral airspace opacities which may reflect pneumonia or edema. Electronically Signed   By: Logan Bores M.D.   On: 09/16/2016 07:30     Medications:   . sodium chloride 10 mL/hr at 09/13/16 1900  . albumin human    . feeding supplement (VITAL HIGH PROTEIN)    . fentaNYL infusion INTRAVENOUS 300 mcg/hr (09/16/16 0626)  . norepinephrine (LEVOPHED) Adult infusion 4 mcg/min (09/16/16 0600)  . phenylephrine (NEO-SYNEPHRINE) Adult infusion 200 mcg/min (09/16/16 0600)  . pureflow 2,000 mL/hr at 09/16/16 0116   . amiodarone  400 mg Per Tube BID  . chlorhexidine gluconate (MEDLINE KIT)  15 mL Mouth Rinse BID  . feeding supplement (PRO-STAT SUGAR FREE 64)  30 mL Per Tube Q4H  . heparin subcutaneous  5,000 Units Subcutaneous Q8H  . insulin aspart  0-15 Units Subcutaneous Q4H  . insulin glargine  10 Units Subcutaneous Daily  . ipratropium-albuterol  3 mL Nebulization Q6H  . mouth rinse  15 mL Mouth Rinse 10 times per day  . pantoprazole sodium  40 mg Per Tube Q1200  . sennosides  5 mL Per Tube BID  . sodium chloride flush  10-40 mL Intracatheter Q12H  . sodium chloride flush  10-40 mL Intracatheter Q12H    acetaminophen **OR** acetaminophen, artificial tears, fentaNYL, heparin, polyvinyl alcohol, sodium chloride flush, sodium chloride flush  Assessment/ Plan:  Mr. Isaiah [name] is a 51 y.o. black male with diabetes mellitus type II, hyeprtension, congestive heart failure, atrial fibrillation,  who was admitted to [facility] on 09/01/2016 for Cardiac arrest ([place]) [I46.9]  1. Acute renal failure: on chronic kidney disease stage III baseline creatinine of 1.4 04/10/16.  Nonoliguric. Iv contrast exposure for CT angiogram at admission On CRRT 4K bath.  Increase UF from 100 mL/hr to 125 then eventually 150 mL/hr  - continue iv Albumin - Monitor renal function, volume status, urine output and electrolytes.    2. Cardiogenic shock with hypotension, with anasarca: on vasopressors:   Supportive care continue iv albumin   3. Acute Respiratory Failure: intubated and sedated.  FiO2 65%      LOS: 9 Isaiah [name] 6/20/20188:49 AM

## 2016-09-16 NOTE — Progress Notes (Signed)
While rounding CH made a follow up visit. CH visited with Pt Mother. The Mother is spiritually strong and is relying on her faith and family as they struggle with heavy decisions. They plan to wait and see for the rest of this week before making decisions. CH is available for follow up as needed.    09/16/16 1500  Clinical Encounter Type  Visited With Family  Visit Type Follow-up;Spiritual support  Consult/Referral To Chaplain  Spiritual Encounters  Spiritual Needs Emotional

## 2016-09-16 NOTE — Progress Notes (Signed)
Chaplain briefly talked with clients family,   09/16/16 0850  Clinical Encounter Type  Visited With Patient and family together  Visit Type Follow-up;Spiritual support  Referral From Chaplain  Consult/Referral To Chaplain  Spiritual Encounters  Spiritual Needs Prayer   on rounds, and offered silent prayer.

## 2016-09-16 NOTE — Plan of Care (Signed)
Problem: Cardiac: Goal: Ability to achieve and maintain adequate cardiopulmonary perfusion will improve Outcome: Progressing Pt has maintained MAP goal >65 while titrating Phenylephrine from 360 mcg to 200 mcg throughout the shift

## 2016-09-16 NOTE — Progress Notes (Signed)
PULMONARY / CRITICAL CARE MEDICINE   Name: Isaiah [name] MRN: [id number] DOB: March 23, 1966    ADMISSION DATE:  09/13/2016  PT PROFILE: 51 yo male with DM, CAF, morbid obesity, cardiomyopathy suffered out of hospital cardiac arrest requiring prolonged CPR/ACLS. Underwent hypothermia protocol. Hospitalization complicated by MODS.  MAJOR EVENTS/TEST RESULTS: 06/11 Admission after out of hospital cardiac arrest 06/11 CTA chest: No PE noted on suboptimal study 06/11 CT head: No acute abnormalities 06/11 EEG: abnormal EEG due to a burst suppression rhythm 06/11 neurology consultation 06/12 anuric renal failure, shock requiring high dose vasopressors 06/12 echocardiogram: LVEF 10% 06/13 Rewarmed. Possible seizure. CRRT initiated 06/13 CT head: cerebral edema 06/13 EEG: This is an abnormal electroencephalogram due the presence of PLEDs that are triphasic in morphology.  Although most prevalent over the left hemisphere, this activity can be noted over the right hemisphere independently and also have a more generalized distribution 06/13 per neurology consultant, prognosis for neurologic recovery deemed poor 06/14 palliative care consultation 06/15 progressive pulmonary infiltrates c/w edema vs ARDS 06/15 goals of care discussion with family. Full code desired 06/15 EEG: This is an abnormal electroencephalogram secondary to the absence of cerebral activity 06/16 worsening oxygenation. Worsening pulmonary infiltrates. Remains on high dose vasopressors. Remains on CRRT. Remains minimally responsive 06/17 worsening pulmonary infiltrates and left pleural effusion. Remains on CRRT. Remains on high-dose vasopressors. Remains minimally responsive 06/18 severe ARDS pattern on x-ray requiring high FiO2/PEEP. Remains on CRRT. On maximum dose vasopressors. Remains minimally responsive but with some spontaneous ventilatory effort 06/18 mother and father updated at bedside. Goals of care again addressed. They  were informed that he is on maximum support. Made DNR in event of cardiac arrest. To consider discontinuation of life-sustaining therapies later in the week 06/19 Tachyarrhythmias. Amiodarone increased. Milrinone stopped. Remains on maximum support with vasopressors, mech vent and CRRT 06/20 No major change overall. Remains on maximum vent support, high dose vasopressors, CRRT. Remains minimally responsive. Vasopressin discontinued. Amiodarone changed to enteral route   INDWELLING DEVICES:: ETT 06/11 >>  L IJ CVL 06/11 >> 06/15 R IJ HD cath 06/13 >>  RUE PICC 06/15 >>    MICRO DATA: MRSA PCR 06/11 >> NEG Resp 06/11 >> consistent with oral flora Blood 06/11 >> NEG Resp 06/18 >>   ANTIMICROBIALS:  Ceftriaxone 06/11 X 1 dose  SUBJECTIVE:  RASS -5. Remains on fentanyl to maintain patient-ventilator synchrony. Remains minimally responsive to painful stimuli.  VITAL SIGNS: BP 113/87   Pulse (!) 30   Temp (!) 96.8 F (36 C)   Resp (!) 25   Ht 6' (1.829 m)   Wt (!) 487 lb (220.9 kg)   SpO2 95%   BMI 66.05 kg/m   HEMODYNAMICS:    VENTILATOR SETTINGS: Vent Mode: PRVC FiO2 (%):  [65 %-90 %] 65 % Set Rate:  [24 bmp] 24 bmp Vt Set:  [550 mL] 550 mL PEEP:  [5 cmH20-15 cmH20] 15 cmH20 Plateau Pressure:  [29 cmH20-37 cmH20] 37 cmH20  INTAKE / OUTPUT: I/O last 3 completed shifts: In: 7030.9 [I.V.:6720.9; NG/GT:90; IV Piggyback:220] Out: 2907 [Urine:355; Emesis/NG output:250; Other:2302]  PHYSICAL EXAMINATION: General: Morbidly obese, fentanyl infusion, RASS -5  Neuro: Spontaneous ventilatory effort, minimal spontaneous movement HEENT: NCAT, scleral edema Cardiovascular: Distant heart sounds, IR IR, no murmurs noted Lungs: coarse bilateral breath sounds without wheezes Abdomen: Severely obese, bowel sounds not audible Ext: cool, diminished to absent pulses, symmetric edema in all 4 extremities  LABS:  BMET  Recent Labs Lab 09/15/16 2131 09/16/16  0419 09/16/16 1038   NA 137 137  137 133*  K 3.9 4.0  4.0 3.7  CL 104 104  103 102  CO2 27 27  27 25   BUN 30* 29*  29* 29*  CREATININE 1.93* 1.81*  2.00* 1.79*  GLUCOSE 127* 133*  137* 122*    Electrolytes  Recent Labs Lab 09/15/16 2131 09/16/16 0100 09/16/16 0419 09/16/16 1038  CALCIUM 8.4*  --  8.5*  8.4* 8.3*  MG 1.8 1.8 1.8 1.7  PHOS 2.8  --  2.7 2.5    CBC  Recent Labs Lab 09/14/16 0513 09/15/16 0420 09/16/16 0419  WBC 17.6* 14.8* 16.0*  HGB 10.4* 10.0* 10.0*  HCT 32.5* 30.8* 30.9*  PLT 139* 110* 106*    Coag's  Recent Labs Lab 09/10/16 0922 09/10/16 1634 09/11/16 0034  APTT 59* 63* 91*    Sepsis Markers No results for input(s): LATICACIDVEN, PROCALCITON, O2SATVEN in the last 168 hours.  ABG  Recent Labs Lab 09/13/16 0500 09/13/16 0650 09/14/16 0400  PHART 7.29* 7.22* 7.25*  PCO2ART 57* 63* 61*  PO2ART 52* UNABLE TO CALCULATE. 61*    Liver Enzymes  Recent Labs Lab 09/12/16 0359  09/14/16 0513  09/15/16 2131 09/16/16 0419 09/16/16 1038  AST 28  --  29  --   --  26  --   ALT 15*  --  10*  --   --  11*  --   ALKPHOS 53  --  73  --   --  69  --   BILITOT 0.8  --  1.9*  --   --  2.7*  --   ALBUMIN 2.0*  < > 2.0*  < > 2.3* 2.3*  2.4* 2.3*  < > = values in this interval not displayed.  Cardiac Enzymes  Recent Labs Lab 09/13/16 0811 09/13/16 1237 09/13/16 1815  TROPONINI 0.05* 0.06* 0.05*    Glucose  Recent Labs Lab 09/15/16 1221 09/15/16 1614 09/15/16 1924 09/16/16 0034 09/16/16 0427 09/16/16 0726  GLUCAP 136* 115* 120* 120* 122* 135*    CXR: Very severe ARDS/edema pattern   ASSESSMENT / PLAN:  PULMONARY A: Acute hypoxemic respiratory failure VDRF post cardiac arrest Severe pulmonary edema +/- ARDS  P:   Cont full vent support - settings reviewed and/or adjusted Cont vent bundle Daily SBT if/when meets criteria   CARDIOVASCULAR A:  S/P out of hospital cardiac arrest 06/11 Severe cardiomyopathy (LVEF 10%) Chronic  atrial fibrillation Cardiogenic shock P:  Continue vasopressors for MAP goal > 65 mmHg Continue amiodarone infusion Discontinue milrinone DNR in event of cardiac arrest  RENAL A:   Anuric renal failure post cardiac arrest P:   Monitor BMET intermittently Monitor I/Os Correct electrolytes as indicated Continue CRRT for now  GASTROINTESTINAL A:   Morbid obesity Gastric ileus P:   SUP: Enteral PPI TF protocol  HEMATOLOGIC A:   Mild ICU acquired anemia without acute blood loss Mild thrombocytopenia P:  DVT px: SQ heparin Monitor CBC intermittently Transfuse per usual guidelines   INFECTIOUS A:   No definite infection identified P:   Monitor temp, WBC count Micro and abx as above   ENDOCRINE A:   Type II DM, controlled P:   Continue Lantus Continue SSI  NEUROLOGIC A:   Anoxic encephalopathy Cerebral edema  Very poor neurological prognosis P:   RASS goal: -1, -2 Continue fentanyl infusion for ventilator synchrony Minimize all other sedatives - propofol discontinued 06/19   FAMILY: No family at bedside  CCM time: 30 mins The above time includes time spent in consultation with patient and/or family members and reviewing care plan on multidisciplinary rounds  Billy Fischeravid Simonds, MD PCCM service Mobile [phone] Pager [phone]  09/16/2016, 11:35 AM

## 2016-09-16 NOTE — Progress Notes (Signed)
Patient ID: Lillia MountainJeffrey M Standage, male   DOB: 03/15/1966, 51 y.o.   MRN: [id number]   I left a message for patient's father offering to meet again with he and his wife regarding goals of care, offering emotional support  Await call back.  Lorinda CreedMary Kelsie Kramp NP  Palliative Medicine Team Team Phone # [phone] Pager [phone]  No charge

## 2016-09-16 NOTE — Progress Notes (Signed)
  This NP visited patient at the bedside to support family and offered emotional support in this unfortunate situation.  Discussed with parents their understanding of the medical situation. We discussed that regardless of his significant anoxic brain injury, patient has other significant multiorgan failure.   In spite of aggressive medical interventions patient has shown no sign of improvement and continues to decline.  Prognosis is grim.     We discussed liberating [name] from the ventilator and letting nature/God's will  takeTinnie Gens its course.  His parents understand that therapies have been maximized and that anything  can happen at anytime, however they have made the decision that they will not withdrawal care until Sunday June 24th.  Emotional support offered.  PMT will continue to support holistically  Questions and concerns addressed          Discussed with bedside RN  Time in 1600          Time out  1640            Total time spent on the unit was 40 min   Greater than 50% of the time was spent in counseling and coordination of care  Lorinda CreedMary Ranata Laughery NP  Palliative Medicine Team Team Phone # [phone] Pager [phone]  Patient ID: Isaiah [name], male   DOB: February 06, 1966, 51 y.o.   MRN: [id number]

## 2016-09-16 NOTE — Progress Notes (Signed)
Pt remains on CRRT, settings: TF 2, UF 100, BF 350; 4K bath.  Able to wean Phenylephrine from 360 mcg to 200 mcg while maintaining MAP >65.  No purposeful movement, pt does open eyes, coughs, and bites ETT.  A-fib on telemetry, rate controlled on Amiodarone gtt.  Fentanyl gtt titrated up due to coughing and vent synchrony.  Urine output was 130 ml from foley.  Remains on Levophed and Vasopressin gtt as well to maintain MAP goal.

## 2016-09-16 NOTE — Progress Notes (Signed)
Patient ID: Isaiah [name], male   DOB: 1966/01/29, 51 y.o.   MRN: [id number]   [facility] PROGRESS NOTE  Isaiah [name] SWN:[id number] DOB: [birth year] DOA: 08/31/2016 PCP: Patient, No Pcp Per  HPI/Subjective: Continues to remain on the ventilator and on multiple pressors   Objective: Vitals:   09/16/16 1200 09/16/16 1300  BP: (!) 74/55 100/68  Pulse: 99 (!) 110  Resp: (!) 24 (!) 24  Temp: 97 F (36.1 C) 97 F (36.1 C)    Filed Weights   09/14/16 0446 09/15/16 1300 09/16/16 0500  Weight: (!) 487 lb (220.9 kg) (!) 489 lb (221.8 kg) (!) 487 lb (220.9 kg)    ROS: Review of Systems  Unable to perform ROS: Critical illness   Exam: Physical Exam  HENT:  Nose: No mucosal edema.  Unable to look into mouth at this time  Eyes: Conjunctivae and lids are normal.  Pupils pinpoint  Neck: Carotid bruit is not present. No thyroid mass and no thyromegaly present.  Cardiovascular: S1 normal and S2 normal.  An irregularly irregular rhythm present. Tachycardia present.  Exam reveals distant heart sounds. Exam reveals no gallop.   No murmur heard. Pulses:      Dorsalis pedis pulses are 2+ on the right side, and 2+ on the left side.  Respiratory: No respiratory distress. He has decreased breath sounds in the right middle field, the right lower field, the left middle field and the left lower field. He has wheezes in the right middle field and the left middle field. He has rhonchi in the right lower field and the left lower field. He has no rales.  GI: Soft. Bowel sounds are normal. There is no tenderness.  Musculoskeletal:       Right knee: He exhibits swelling.       Left knee: He exhibits swelling.       Right ankle: He exhibits swelling.       Left ankle: He exhibits swelling.  Lymphadenopathy:    He has no cervical adenopathy.  Neurological: He is unresponsive.  Unresponsive to painful stimuli  Skin: No rash noted. Nails show no clubbing.  Psychiatric:  Unable to assess at  this time secondary to unresponsive      Data Reviewed: Basic Metabolic Panel:  Recent Labs Lab 09/15/16 1227 09/15/16 1638 09/15/16 2131 09/16/16 0100 09/16/16 0419 09/16/16 1038  NA 135 134* 137  --  137  137 133*  K 3.8 3.8 3.9  --  4.0  4.0 3.7  CL 102 102 104  --  104  103 102  CO2 26 26 27   --  27  27 25   GLUCOSE 146* 136* 127*  --  133*  137* 122*  BUN 30* 30* 30*  --  29*  29* 29*  CREATININE 1.93* 1.88* 1.93*  --  1.81*  2.00* 1.79*  CALCIUM 8.2* 8.3* 8.4*  --  8.5*  8.4* 8.3*  MG 1.8 1.8 1.8 1.8 1.8 1.7  PHOS 2.5 2.5 2.8  --  2.7 2.5   Liver Function Tests:  Recent Labs Lab 09/11/16 0458  09/12/16 0359  09/14/16 0513  09/15/16 1227 09/15/16 1638 09/15/16 2131 09/16/16 0419 09/16/16 1038  AST 40  --  28  --  29  --   --   --   --  26  --   ALT 23  --  15*  --  10*  --   --   --   --  11*  --  ALKPHOS 72  --  53  --  73  --   --   --   --  69  --   BILITOT 0.7  --  0.8  --  1.9*  --   --   --   --  2.7*  --   PROT 6.9  --  5.2*  --  6.2*  --   --   --   --  6.3*  --   ALBUMIN 2.8*  2.8*  < > 2.0*  < > 2.0*  < > 2.2* 2.2* 2.3* 2.3*  2.4* 2.3*  < > = values in this interval not displayed. CBC:  Recent Labs Lab 09/11/16 0458 09/12/16 0359 09/14/16 0513 09/15/16 0420 09/16/16 0419  WBC 12.8* 12.4* 17.6* 14.8* 16.0*  HGB 11.5* 9.8* 10.4* 10.0* 10.0*  HCT 36.4* 31.0* 32.5* 30.8* 30.9*  MCV 81.3 80.8 79.4* 80.8 79.0*  PLT 202 125* 139* 110* 106*   Cardiac Enzymes:  Recent Labs Lab 09/13/16 0811 09/13/16 1237 09/13/16 1815  TROPONINI 0.05* 0.06* 0.05*   BNP (last 3 results)  Recent Labs  08/29/2016 0750  BNP 508.0*     CBG:  Recent Labs Lab 09/15/16 1924 09/16/16 0034 09/16/16 0427 09/16/16 0726 09/16/16 1158  GLUCAP 120* 120* 122* 135* 103*    Recent Results (from the past 240 hour(s))  Culture, blood (routine x 2)     Status: None   Collection Time: 09/21/2016  8:18 AM  Result Value Ref Range Status   Specimen  Description BLOOD L AC  Final   Special Requests   Final    BOTTLES DRAWN AEROBIC AND ANAEROBIC Blood Culture adequate volume   Culture NO GROWTH 9 DAYS  Final   Report Status 09/16/2016 FINAL  Final  MRSA PCR Screening     Status: None   Collection Time: 09/20/2016  9:46 AM  Result Value Ref Range Status   MRSA by PCR NEGATIVE NEGATIVE Final    Comment:        The GeneXpert MRSA Assay (FDA approved for NASAL specimens only), is one component of a comprehensive MRSA colonization surveillance program. It is not intended to diagnose MRSA infection nor to guide or monitor treatment for MRSA infections.   Culture, blood (routine x 2)     Status: None   Collection Time: 09/03/2016 11:45 AM  Result Value Ref Range Status   Specimen Description BLOOD L HAND  Final   Special Requests   Final    BOTTLES DRAWN AEROBIC ONLY Blood Culture results may not be optimal due to an inadequate volume of blood received in culture bottles   Culture NO GROWTH 9 DAYS  Final   Report Status 09/16/2016 FINAL  Final  Culture, respiratory (NON-Expectorated)     Status: None   Collection Time: 09/06/2016  6:32 PM  Result Value Ref Range Status   Specimen Description TRACHEAL ASPIRATE  Final   Special Requests NONE  Final   Gram Stain   Final    FEW WBC PRESENT, PREDOMINANTLY PMN MODERATE GRAM POSITIVE COCCI IN CHAINS IN PAIRS MODERATE GRAM NEGATIVE RODS    Culture   Final    Consistent with normal respiratory flora. Performed at [facility], [place] [address] [place], [place] [address]    Report Status 09/10/2016 FINAL  Final  Culture, respiratory (NON-Expectorated)     Status: None   Collection Time: 09/14/16 12:21 AM  Result Value Ref Range Status   Specimen Description TRACHEAL  ASPIRATE  Final   Special Requests NONE  Final   Gram Stain   Final    ABUNDANT WBC PRESENT, PREDOMINANTLY PMN NO SQUAMOUS EPITHELIAL CELLS SEEN NO ORGANISMS SEEN    Culture   Final    NO GROWTH Performed at [facility], [address], [place], [place] [address]    Report Status 09/16/2016 FINAL  Final     Studies: Dg Chest Port 1 View  Result Date: 09/16/2016 CLINICAL DATA:  Respiratory failure. EXAM: PORTABLE CHEST 1 VIEW COMPARISON:  09/14/2016 FINDINGS: Image quality is degraded by patient body habitus. Endotracheal tube terminates at the level of the clavicles, 6.5 cm above the carina. Enteric tube courses towards the upper abdomen with tip not imaged. Right jugular catheter terminates over the high right atrium. The cardiac silhouette is largely obscured. Diffuse bilateral airspace opacities have mildly worsened, most notably in the right lung base. Multiple areas of dense consolidation are again seen with some air bronchograms. No pneumothorax is identified. IMPRESSION: Mild worsening of diffuse bilateral airspace opacities which may reflect pneumonia or edema. Electronically Signed   By: Logan Bores M.D.   On: 09/16/2016 07:30    Scheduled Meds: . amiodarone  400 mg Per Tube BID  . chlorhexidine gluconate (MEDLINE KIT)  15 mL Mouth Rinse BID  . feeding supplement (PRO-STAT SUGAR FREE 64)  30 mL Per Tube Q4H  . heparin subcutaneous  5,000 Units Subcutaneous Q8H  . insulin aspart  0-15 Units Subcutaneous Q4H  . insulin glargine  10 Units Subcutaneous Daily  . ipratropium-albuterol  3 mL Nebulization Q6H  . mouth rinse  15 mL Mouth Rinse 10 times per day  . pantoprazole sodium  40 mg Per Tube Q1200  . sennosides  5 mL Per Tube BID  . sodium chloride flush  10-40 mL Intracatheter Q12H  . sodium chloride flush  10-40 mL Intracatheter Q12H   Continuous Infusions: . sodium chloride 10 mL/hr at 09/13/16 1900  . albumin human    . feeding supplement (VITAL HIGH PROTEIN)    . fentaNYL infusion INTRAVENOUS 300 mcg/hr (09/16/16 0626)  . norepinephrine (LEVOPHED) Adult infusion Stopped (09/16/16 0811)  . phenylephrine (NEO-SYNEPHRINE) Adult infusion 225 mcg/min (09/16/16 1311)  . pureflow  15,000 mL (09/16/16 1010)    Assessment/Plan:  1. Cardiac arrest With multiorgan failure. Suspect due to arrhythmia related with an EF of 10% seen on echocardiogram.  Overall prognosis poor with anoxic encephalopathy. No significant improvement in his conditionChance of recovery unlikely 2. 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 On milrinone.  With EF of 10% his overall prognosis is poor. Continue milrinone 3. Acute kidney injury secondary to cardiac arrest, CT scan with contrast. Continue CRRT 4. Anoxic encephalopathy with brain edema. Acute encephalopathy. Patient currently on sedation with fentanyl and versed. Previous EEG showing no seizures. Patient started on valproate sodium.  Neurology follow up.  Palliative care team following prognosis poor 5. Acute hypoxic respiratory failure. Patient has been on 70% FiO2 6. Hypotension- Continue Levophed  7. Type 2 diabetes mellitus. On sliding scale and low-dose Levemir. 8. Rapid Atrial fibrillation. Continue heparin drip. Patient on amiodarone drip  9. Morbid obesity and likely sleep apnea.  Code Status:     Code Status Orders        Start     Ordered   08/31/2016 1005  Full code  Continuous     08/31/2016 1007    Code Status History  Date Active Date Inactive Code Status Order ID Comments User Context   09/06/2016  8:30 AM 09/16/2016 10:08 AM Full Code [id number]  Loletha Grayer, MD ED     Family Communication: Discussed with mother  Consultants:  Critical care specialist  Cardiology  Neurology  Nephrology  Palliative care consultation  Time spent: 26 minutes  [place]           googel

## 2016-09-17 ENCOUNTER — Inpatient Hospital Stay: Payer: BLUE CROSS/BLUE SHIELD

## 2016-09-17 DIAGNOSIS — J810 Acute pulmonary edema: Secondary | ICD-10-CM

## 2016-09-17 DIAGNOSIS — N179 Acute kidney failure, unspecified: Secondary | ICD-10-CM

## 2016-09-17 LAB — COMPREHENSIVE METABOLIC PANEL
ALT: 13 U/L — ABNORMAL LOW (ref 17–63)
ANION GAP: 5 (ref 5–15)
AST: 30 U/L (ref 15–41)
Albumin: 2.5 g/dL — ABNORMAL LOW (ref 3.5–5.0)
Alkaline Phosphatase: 69 U/L (ref 38–126)
BUN: 31 mg/dL — ABNORMAL HIGH (ref 6–20)
CO2: 27 mmol/L (ref 22–32)
CREATININE: 2 mg/dL — AB (ref 0.61–1.24)
Calcium: 8.3 mg/dL — ABNORMAL LOW (ref 8.9–10.3)
Chloride: 106 mmol/L (ref 101–111)
GFR, EST AFRICAN AMERICAN: 44 mL/min — AB (ref >60)
GFR, EST NON AFRICAN AMERICAN: 38 mL/min — AB (ref >60)
Glucose, Bld: 98 mg/dL (ref 65–99)
POTASSIUM: 4 mmol/L (ref 3.5–5.1)
SODIUM: 138 mmol/L (ref 135–145)
Total Bilirubin: 3.2 mg/dL — ABNORMAL HIGH (ref 0.3–1.2)
Total Protein: 6.4 g/dL — ABNORMAL LOW (ref 6.5–8.1)

## 2016-09-17 LAB — MAGNESIUM
MAGNESIUM: 1.9 mg/dL (ref 1.7–2.4)
Magnesium: 1.8 mg/dL (ref 1.7–2.4)
Magnesium: 1.8 mg/dL (ref 1.7–2.4)
Magnesium: 1.8 mg/dL (ref 1.7–2.4)

## 2016-09-17 LAB — CBC
HCT: 30.5 % — ABNORMAL LOW (ref 40.0–52.0)
Hemoglobin: 9.9 g/dL — ABNORMAL LOW (ref 13.0–18.0)
MCH: 25.8 pg — AB (ref 26.0–34.0)
MCHC: 32.3 g/dL (ref 32.0–36.0)
MCV: 79.9 fL — AB (ref 80.0–100.0)
PLATELETS: 102 10*3/uL — AB (ref 150–440)
RBC: 3.82 MIL/uL — ABNORMAL LOW (ref 4.40–5.90)
RDW: 18.3 % — AB (ref 11.5–14.5)
WBC: 17 10*3/uL — ABNORMAL HIGH (ref 3.8–10.6)

## 2016-09-17 LAB — RENAL FUNCTION PANEL
ALBUMIN: 2.4 g/dL — AB (ref 3.5–5.0)
Albumin: 2.5 g/dL — ABNORMAL LOW (ref 3.5–5.0)
Albumin: 2.4 g/dL — ABNORMAL LOW (ref 3.5–5.0)
Albumin: 2.2 g/dL — ABNORMAL LOW (ref 3.5–5.0)
Anion gap: 5 (ref 5–15)
Anion gap: 4 — ABNORMAL LOW (ref 5–15)
Anion gap: 4 — ABNORMAL LOW (ref 5–15)
Anion gap: 5 (ref 5–15)
BUN: 28 mg/dL — ABNORMAL HIGH (ref 6–20)
BUN: 30 mg/dL — ABNORMAL HIGH (ref 6–20)
BUN: 30 mg/dL — AB (ref 6–20)
BUN: 35 mg/dL — ABNORMAL HIGH (ref 6–20)
CALCIUM: 8.4 mg/dL — AB (ref 8.9–10.3)
CALCIUM: 8.6 mg/dL — AB (ref 8.9–10.3)
CHLORIDE: 106 mmol/L (ref 101–111)
CO2: 27 mmol/L (ref 22–32)
CO2: 27 mmol/L (ref 22–32)
CO2: 27 mmol/L (ref 22–32)
CO2: 24 mmol/L (ref 22–32)
CREATININE: 1.92 mg/dL — AB (ref 0.61–1.24)
Calcium: 8.7 mg/dL — ABNORMAL LOW (ref 8.9–10.3)
Calcium: 8.2 mg/dL — ABNORMAL LOW (ref 8.9–10.3)
Chloride: 106 mmol/L (ref 101–111)
Chloride: 106 mmol/L (ref 101–111)
Chloride: 106 mmol/L (ref 101–111)
Creatinine, Ser: 1.91 mg/dL — ABNORMAL HIGH (ref 0.61–1.24)
Creatinine, Ser: 1.99 mg/dL — ABNORMAL HIGH (ref 0.61–1.24)
Creatinine, Ser: 2.16 mg/dL — ABNORMAL HIGH (ref 0.61–1.24)
GFR calc Af Amer: 46 mL/min — ABNORMAL LOW (ref >60)
GFR calc Af Amer: 44 mL/min — ABNORMAL LOW (ref >60)
GFR calc Af Amer: 40 mL/min — ABNORMAL LOW (ref >60)
GFR calc non Af Amer: 38 mL/min — ABNORMAL LOW (ref >60)
GFR calc non Af Amer: 34 mL/min — ABNORMAL LOW (ref >60)
GFR, EST AFRICAN AMERICAN: 46 mL/min — AB (ref >60)
GFR, EST NON AFRICAN AMERICAN: 40 mL/min — AB (ref >60)
GFR, EST NON AFRICAN AMERICAN: 40 mL/min — AB (ref >60)
Glucose, Bld: 91 mg/dL (ref 65–99)
Glucose, Bld: 99 mg/dL (ref 65–99)
Glucose, Bld: 93 mg/dL (ref 65–99)
Glucose, Bld: 101 mg/dL — ABNORMAL HIGH (ref 65–99)
PHOSPHORUS: 2.4 mg/dL — AB (ref 2.5–4.6)
Phosphorus: 2.5 mg/dL (ref 2.5–4.6)
Phosphorus: 2.3 mg/dL — ABNORMAL LOW (ref 2.5–4.6)
Phosphorus: 2.2 mg/dL — ABNORMAL LOW (ref 2.5–4.6)
Potassium: 3.8 mmol/L (ref 3.5–5.1)
Potassium: 4 mmol/L (ref 3.5–5.1)
Potassium: 3.9 mmol/L (ref 3.5–5.1)
Potassium: 3.7 mmol/L (ref 3.5–5.1)
SODIUM: 137 mmol/L (ref 135–145)
SODIUM: 138 mmol/L (ref 135–145)
Sodium: 137 mmol/L (ref 135–145)
Sodium: 135 mmol/L (ref 135–145)

## 2016-09-17 LAB — GLUCOSE, CAPILLARY
GLUCOSE-CAPILLARY: 86 mg/dL (ref 65–99)
Glucose-Capillary: 102 mg/dL — ABNORMAL HIGH (ref 65–99)
Glucose-Capillary: 83 mg/dL (ref 65–99)
Glucose-Capillary: 86 mg/dL (ref 65–99)
Glucose-Capillary: 105 mg/dL — ABNORMAL HIGH (ref 65–99)

## 2016-09-17 MED ORDER — LORAZEPAM 2 MG/ML IJ SOLN
1.0000 mg | INTRAMUSCULAR | Status: DC | PRN
  Administered 2016-09-17 – 2016-09-18 (×5): 1 mg via INTRAVENOUS
  Filled 2016-09-17 (×5): qty 1

## 2016-09-17 MED ORDER — VITAL HIGH PROTEIN PO LIQD
1000.0000 mL | ORAL | Status: DC
  Administered 2016-09-17: 1000 mL

## 2016-09-17 MED ORDER — STERILE WATER FOR INJECTION IJ SOLN
INTRAMUSCULAR | Status: AC
  Administered 2016-09-17: 16:00:00
  Filled 2016-09-17: qty 10

## 2016-09-17 MED ORDER — ALTEPLASE 2 MG IJ SOLR
4.0000 mg | Freq: Once | INTRAMUSCULAR | Status: AC
  Administered 2016-09-17: 2 mg
  Filled 2016-09-17: qty 4

## 2016-09-17 NOTE — Progress Notes (Signed)
PHARMACY - CRITICAL CARE PROGRESS NOTE  Pharmacy Consult for Amiodarone Interactions and CRRT Dosage Adjustments   No Known Allergies  Patient Measurements: Height: 6' (182.9 cm) Weight: (!) 490 lb (222.3 kg) IBW/kg (Calculated) : 77.6  Vital Signs: Temp: 97.9 F (36.6 C) (06/21 1400) Temp Source: Core (Comment) (06/21 1200) BP: 78/56 (06/21 1400) Pulse Rate: 131 (06/21 1400) Intake/Output from previous day: 06/20 0701 - 06/21 0700 In: 5939.1 [I.V.:5909.1; NG/GT:30] Out: 1697 [Urine:115] Intake/Output from this shift: Total I/O In: 1761.6 [I.V.:1610.4; NG/GT:101.2; IV Piggyback:50] Out: 724 [Urine:5; Other:719] Vent settings for last 24 hours: Vent Mode: PRVC FiO2 (%):  [80 %-100 %] 80 % Set Rate:  [24 bmp] 24 bmp Vt Set:  [550 mL] 550 mL PEEP:  [15 cmH20] 15 cmH20 Plateau Pressure:  [31 cmH20] 31 cmH20  Labs:  Recent Labs  09/15/16 0420  09/16/16 0419  09/17/16 0017 09/17/16 0443 09/17/16 1217  WBC 14.8*  --  16.0*  --   --  17.0*  --   HGB 10.0*  --  10.0*  --   --  9.9*  --   HCT 30.8*  --  30.9*  --   --  30.5*  --   PLT 110*  --  106*  --   --  102*  --   CREATININE 1.96*  1.94*  < > 1.81*  2.00*  < > 1.91* 1.92*  2.00* 1.99*  MG 1.8  < > 1.8  < > 1.8 1.9 1.8  PHOS 2.3*  < > 2.7  < > 2.5 2.4* 2.3*  ALBUMIN 2.0*  < > 2.3*  2.4*  < > 2.5* 2.4*  2.5* 2.4*  PROT  --   --  6.3*  --   --  6.4*  --   AST  --   --  26  --   --  30  --   ALT  --   --  11*  --   --  13*  --   ALKPHOS  --   --  69  --   --  69  --   BILITOT  --   --  2.7*  --   --  3.2*  --   < > = values in this interval not displayed. Estimated Creatinine Clearance: 87 mL/min (A) (by C-G formula based on SCr of 1.99 mg/dL (H)).   Recent Labs  09/17/16 0401 09/17/16 0715 09/17/16 1146  GLUCAP 102* 86 83    Assessment: 51 y/o M s/p cardiac arrest and Code ICE on ventilation, sedation, vasopressors, and CRRT.   Plan:  1. There are currently no significant drug interactions with  amiodarone.   Recommendations: Amiodarone is metabolized by the cytochrome P450 system and therefore has the potential to cause many drug interactions. Amiodarone has an average plasma half-life of 50 days (range 20 to 100 days).   There is potential for drug interactions to occur several weeks or months after stopping treatment and the onset of drug interactions may be slow after initiating amiodarone.   []  Statins: Increased risk of myopathy. Simvastatin- restrict dose to 20mg  daily. Other statins: counsel patients to report any muscle pain or weakness immediately.  []  Anticoagulants: Amiodarone can increase anticoagulant effect. Consider warfarin dose reduction. Patients should be monitored closely and the dose of anticoagulant altered accordingly, remembering that amiodarone levels take several weeks to stabilize.  []  Antiepileptics: Amiodarone can increase plasma concentration of phenytoin, the dose should be reduced. Note that small changes  in phenytoin dose can result in large changes in levels. Monitor patient and counsel on signs of toxicity.  []  Beta blockers: increased risk of bradycardia, AV block and myocardial depression. Sotalol - avoid concomitant use.  []   Calcium channel blockers (diltiazem and verapamil): increased risk of bradycardia, AV block and myocardial depression.  []   Cyclosporine: Amiodarone increases levels of cyclosporine. Reduced dose of cyclosporine is recommended.  []  Digoxin dose should be halved when amiodarone is started.  []  Diuretics: increased risk of cardiotoxicity if hypokalemia occurs.  []  Oral hypoglycemic agents (glyburide, glipizide, glimepiride): increased risk of hypoglycemia. Patient's glucose levels should be monitored closely when initiating amiodarone therapy.   []  Drugs that prolong the QT interval:  Torsades de pointes risk may be increased with concurrent use - avoid if possible.  Monitor QTc, also keep magnesium/potassium WNL if  concurrent therapy can't be avoided. Marland Kitchen. Antibiotics: e.g. fluoroquinolones, erythromycin. . Antiarrhythmics: e.g. quinidine, procainamide, disopyramide, sotalol. . Antipsychotics: e.g. phenothiazines, haloperidol.  . Lithium, tricyclic antidepressants, and methadone.  2. No medications require adjustment for CRRT at present.   Sundeep Cary L 09/17/2016,3:00 PM

## 2016-09-17 NOTE — Progress Notes (Signed)
CRRT halted at 1255 PM d/t high venous pressure and high transmembranous pressure alarms.  Pt's access flushed well, but no blood [facility] Air vent cap.  Filter appeared clotted, 3 cc bolus did not clear.  No rinseback performed per NextStage instructions.  6" long clot observed when writer disconnected venous line from patient.  Cartridge and bags are being replaced at this time.

## 2016-09-17 NOTE — Progress Notes (Signed)
Nutrition Follow-up  DOCUMENTATION CODES:   Morbid obesity  INTERVENTION:  1. Trickle feeds re-initiated via OGT by CCU MD 2. Monitor for GOC, family not willing to withdraw care until 6/24  NUTRITION DIAGNOSIS:   Inadequate oral intake related to inability to eat (sedated on vent) as evidenced by NPO status. -ongoing  GOAL:   Provide needs based on ASPEN/SCCM guidelines -not meeting  MONITOR:   Vent status, Labs, Weight trends, I & O's   ASSESSMENT:   51 y/o morbidly obese male with h/o DM, CHF, admitted for cardiac arrest of unclear etiology.  Patient is currently intubated on ventilator support MV: 12.2 L/min Temp (24hrs), Avg:97.4 F (36.3 C), Min:97 F (36.1 C), Max:97.7 F (36.5 C) Propofol: none  Family at bedside. No longer suctioning bile Trickle feeds, on CRRT D10 @ 6630mL/hr --> 82 calories Fentanyl gttt, Neo gtt IV Albumin  Intake/Output Summary (Last 24 hours) at 09/17/16 1145 Last data filed at 09/17/16 1100  Gross per 24 hour  Intake          7036.74 ml  Output             2273 ml  Net          4763.74 ml     Diet Order:     Skin:  Reviewed, no issues  Last BM:  none since admit   Height:   Ht Readings from Last 1 Encounters:  09/17/16 6' (1.829 m)    Weight:   Wt Readings from Last 1 Encounters:  09/17/16 (!) 490 lb (222.3 kg)    Ideal Body Weight:  80.9 kg  BMI:  Body mass index is 66.46 kg/m.  Estimated Nutritional Needs:   Kcal:  1800-2000kcal/day (22-25kcal/day IBW)  Protein:  >200g/day   Fluid:  781ml/kcal/day  EDUCATION NEEDS:   Education needs no appropriate at this time  Dionne AnoWilliam M. Yaakov Saindon, MS, RD LDN Inpatient Clinical Dietitian Pager [phone]

## 2016-09-17 NOTE — Progress Notes (Signed)
[facility]  ROUNDING NOTE   Subjective:   Patient remains critically ill,intubated and sedated Requiring high dose of pressors-   Tolerated CRRT UF 150 cc / hr  UOP poor cc   Fio2 100 %, PEEP 15  Drips neosynephrine Fentanyl  Obligate iv intake about 80 cc /hr   Objective:  Vital signs in last 24 hours:  Temp:  [97 F (36.1 C)-97.7 F (36.5 C)] 97.3 F (36.3 C) (06/21 0800) Pulse Rate:  [57-134] 97 (06/21 0800) Resp:  [9-30] 9 (06/21 0800) BP: (70-116)/(40-90) 104/73 (06/21 0800) SpO2:  [87 %-100 %] 94 % (06/21 0820) FiO2 (%):  [70 %-100 %] 100 % (06/21 0820) Weight:  [222.3 kg (490 lb)] 222.3 kg (490 lb) (06/21 0700)  Weight change: 0.454 kg (1 lb) Filed Weights   09/15/16 1300 09/16/16 0500 09/17/16 0700  Weight: (!) 221.8 kg (489 lb) (!) 220.9 kg (487 lb) (!) 222.3 kg (490 lb)    Intake/Output: I/O last 3 completed shifts: In: 6494.2 [I.V.:6464.2; NG/GT:30] Out: 2839 [Urine:245; Emesis/NG output:50; Other:2544]   Intake/Output this shift:  Total I/O In: 402.6 [I.V.:402.6] Out: 145 [Urine:5; Other:140]  Physical Exam: General: Critically ill   H, ENT ETT, OGT  Eyes: Eyes closed. Conjunctival edema with conjunctival hemorrhage  Neck: No masses  Lungs:  Diminished, vent assisted  Heart: Irregular, tachycardia  Abdomen:  Soft,  obese  Extremities: + dependent  peripheral edema; anasarca  Neurologic: Intubated, sedated  Skin: No acute rashes  Access: RIJ temp HD catheter 6/13 Dr. Ashby Dawes    Basic Metabolic Panel:  Recent Labs Lab 09/16/16 1038 09/16/16 1650 09/16/16 2049 09/17/16 0017 09/17/16 0443  NA 133* 135 138 138 137  138  K 3.7 3.8 3.9 3.8 4.0  4.0  CL 102 102 106 106 106  106  CO2 _0 GLUCOSE 122* 88 86 91 99  98  BUN 29* 28* 29* 30* 28*  31*  CREATININE 1.79* 1.72* 1.80* 1.91* 1.92*  2.00*  CALCIUM 8.3* 8.5* 8.7* 8.6* 8.4*  8.3*  MG 1.7 1.8 1.8 1.8 1.9  PHOS 2.5 2.5 2.3* 2.5 2.4*     Liver Function Tests:  Recent Labs Lab 09/11/16 0458  09/12/16 0359  09/14/16 0513  09/16/16 0419 09/16/16 1038 09/16/16 1650 09/16/16 2049 09/17/16 0017 09/17/16 0443  AST 40  --  28  --  29  --  26  --   --   --   --  30  ALT 23  --  15*  --  10*  --  11*  --   --   --   --  13*  ALKPHOS 72  --  53  --  73  --  69  --   --   --   --  69  BILITOT 0.7  --  0.8  --  1.9*  --  2.7*  --   --   --   --  3.2*  PROT 6.9  --  5.2*  --  6.2*  --  6.3*  --   --   --   --  6.4*  ALBUMIN 2.8*  2.8*  < > 2.0*  < > 2.0*  < > 2.3*  2.4* 2.3* 2.4* 2.4* 2.5* 2.4*  2.5*  < > = values in this interval not displayed. No results for input(s): LIPASE, AMYLASE in the last 168 hours. No results for input(s): AMMONIA in the last 168 hours.  CBC:  Recent Labs Lab 09/12/16 0359 09/14/16 0513 09/15/16 0420 09/16/16 0419 09/17/16 0443  WBC 12.4* 17.6* 14.8* 16.0* 17.0*  HGB 9.8* 10.4* 10.0* 10.0* 9.9*  HCT 31.0* 32.5* 30.8* 30.9* 30.5*  MCV 80.8 79.4* 80.8 79.0* 79.9*  PLT 125* 139* 110* 106* 102*    Cardiac Enzymes:  Recent Labs Lab 09/13/16 0811 09/13/16 1237 09/13/16 1815  TROPONINI 0.05* 0.06* 0.05*    BNP: Invalid input(s): POCBNP  CBG:  Recent Labs Lab 09/16/16 1641 09/16/16 1943 09/16/16 2343 09/17/16 0401 09/17/16 0715  GLUCAP 80 75 57 102* 69    Microbiology: Results for orders placed or performed during the hospital encounter of 09/23/2016  Culture, blood (routine x 2)     Status: None   Collection Time: 08/31/2016  8:18 AM  Result Value Ref Range Status   Specimen Description BLOOD L AC  Final   Special Requests   Final    BOTTLES DRAWN AEROBIC AND ANAEROBIC Blood Culture adequate volume   Culture NO GROWTH 9 DAYS  Final   Report Status 09/16/2016 FINAL  Final  MRSA PCR Screening     Status: None   Collection Time: 09/15/2016  9:46 AM  Result Value Ref Range Status   MRSA by PCR NEGATIVE NEGATIVE Final    Comment:        The GeneXpert MRSA Assay  (FDA approved for NASAL specimens only), is one component of a comprehensive MRSA colonization surveillance program. It is not intended to diagnose MRSA infection nor to guide or monitor treatment for MRSA infections.   Culture, blood (routine x 2)     Status: None   Collection Time: 09/06/2016 11:45 AM  Result Value Ref Range Status   Specimen Description BLOOD L HAND  Final   Special Requests   Final    BOTTLES DRAWN AEROBIC ONLY Blood Culture results may not be optimal due to an inadequate volume of blood received in culture bottles   Culture NO GROWTH 9 DAYS  Final   Report Status 09/16/2016 FINAL  Final  Culture, respiratory (NON-Expectorated)     Status: None   Collection Time: 09/24/2016  6:32 PM  Result Value Ref Range Status   Specimen Description TRACHEAL ASPIRATE  Final   Special Requests NONE  Final   Gram Stain   Final    FEW WBC PRESENT, PREDOMINANTLY PMN MODERATE GRAM POSITIVE COCCI IN CHAINS IN PAIRS MODERATE GRAM NEGATIVE RODS    Culture   Final    Consistent with normal respiratory flora. Performed at [facility], [place] [address] [place], [place] [address]    Report Status 09/10/2016 FINAL  Final  Culture, respiratory (NON-Expectorated)     Status: None   Collection Time: 09/14/16 12:21 AM  Result Value Ref Range Status   Specimen Description TRACHEAL ASPIRATE  Final   Special Requests NONE  Final   Gram Stain   Final    ABUNDANT WBC PRESENT, PREDOMINANTLY PMN NO SQUAMOUS EPITHELIAL CELLS SEEN NO ORGANISMS SEEN    Culture   Final    NO GROWTH Performed at [facility], [place] [address], [place], [place] [address]    Report Status 09/16/2016 FINAL  Final    Coagulation Studies: No results for input(s): LABPROT, INR in the last 72 hours.  Urinalysis: No results for input(s): COLORURINE, LABSPEC, PHURINE, GLUCOSEU, HGBUR, BILIRUBINUR, KETONESUR, PROTEINUR, UROBILINOGEN, NITRITE, LEUKOCYTESUR in the last 72 hours.  Invalid input(s):  APPERANCEUR    Imaging: Dg Chest [facility]  1 View  Result Date: 09/16/2016 CLINICAL DATA:  Respiratory failure. EXAM: PORTABLE CHEST 1 VIEW COMPARISON:  09/14/2016 FINDINGS: Image quality is degraded by patient body habitus. Endotracheal tube terminates at the level of the clavicles, 6.5 cm above the carina. Enteric tube courses towards the upper abdomen with tip not imaged. Right jugular catheter terminates over the high right atrium. The cardiac silhouette is largely obscured. Diffuse bilateral airspace opacities have mildly worsened, most notably in the right lung base. Multiple areas of dense consolidation are again seen with some air bronchograms. No pneumothorax is identified. IMPRESSION: Mild worsening of diffuse bilateral airspace opacities which may reflect pneumonia or edema. Electronically Signed   By: Logan Bores M.D.   On: 09/16/2016 07:30     Medications:   . sodium chloride Stopped (09/17/16 0800)  . albumin human    . dextrose 30 mL/hr at 09/17/16 0800  . feeding supplement (VITAL HIGH PROTEIN)    . fentaNYL infusion INTRAVENOUS 400 mcg/hr (09/17/16 0848)  . norepinephrine (LEVOPHED) Adult infusion Stopped (09/16/16 0811)  . phenylephrine (NEO-SYNEPHRINE) Adult infusion 350.133 mcg/min (09/17/16 0800)  . pureflow 2,000 mL/hr at 09/17/16 0600   . amiodarone  400 mg Per Tube BID  . chlorhexidine gluconate (MEDLINE KIT)  15 mL Mouth Rinse BID  . feeding supplement (PRO-STAT SUGAR FREE 64)  30 mL Per Tube Q4H  . heparin subcutaneous  5,000 Units Subcutaneous Q8H  . insulin aspart  0-15 Units Subcutaneous Q4H  . insulin glargine  10 Units Subcutaneous Daily  . ipratropium-albuterol  3 mL Nebulization Q6H  . mouth rinse  15 mL Mouth Rinse 10 times per day  . pantoprazole sodium  40 mg Per Tube Q1200  . sennosides  5 mL Per Tube BID  . sodium chloride flush  10-40 mL Intracatheter Q12H  . sodium chloride flush  10-40 mL Intracatheter Q12H   acetaminophen **OR** acetaminophen,  artificial tears, fentaNYL, heparin, polyvinyl alcohol, sodium chloride flush, sodium chloride flush  Assessment/ Plan:  Mr. MACY LINGENFELTER is a 51 y.o. black male with diabetes mellitus type II, hyeprtension, congestive heart failure, atrial fibrillation,  who was admitted to [facility] on 09/23/2016 for Cardiac arrest ([place]) [I46.9]  1. Acute renal failure: on chronic kidney disease stage III baseline creatinine of 1.4 04/10/16.  Nonoliguric. iv contrast exposure for CT angiogram at admission On CRRT 4K bath.  continued UF at 150 mL/hr  - continue iv Albumin - Monitor renal function, volume status, urine output and electrolytes.    2. Cardiogenic shock with hypotension, with anasarca: on vasopressors:   Supportive care continue iv albumin  3. Acute Respiratory Failure: intubated and sedated.  FiO2 100 %      LOS: 10 Treon Kehl 6/21/20188:48 AM

## 2016-09-17 NOTE — Progress Notes (Signed)
CRRT stopped at this time, trialysis catheter will be exchanged, venous pressure is too high for CRRT to run

## 2016-09-17 NOTE — Procedures (Signed)
Central Venous Catheter Insertion Procedure Note - Double lumen dialysis Catheter Isaiah [name] [id number] [birth year]  Procedure: Insertion of Central Venous Catheter Indications: Assessment of intravascular volume, Drug and/or fluid administration and Frequent blood sampling  Procedure Details Consent: Unable to obtain consent because of emergent medical necessity. Time Out: Verified patient identification, verified procedure, site/side was marked, verified correct patient position, special equipment/implants available, medications/allergies/relevent history reviewed, required imaging and test results available.  Performed  Maximum sterile technique was used including antiseptics, cap, gloves, gown, hand hygiene, mask and sheet. Skin prep: Chlorhexidine; local anesthetic administered A antimicrobial bonded/coated double lumen catheter was placed in the right internal jugular vein using the Seldinger technique.  Evaluation Blood flow good Complications: No apparent complications Patient did tolerate procedure well. Chest X-ray ordered to verify placement.  CXR: pending.      Right IJ dialysis Catheter was exchanged over the guide wire.  Patient tolerated the procedure well.  No complications noted.   [name] Varughese,AG-ACNP Pulmonary & Critical Care

## 2016-09-17 NOTE — Progress Notes (Addendum)
Patient ID: Isaiah [name], male   DOB: [birth year], 51 y.o.   MRN: [id number]   [facility] PROGRESS NOTE  Isaiah [name] CVE:[id number] DOB: 10-01-1965 DOA: 09/06/2016 PCP: Patient, No Pcp Per  HPI/Subjective: Remains on the ventilator. No significant improvement   Objective: Vitals:   09/17/16 1300 09/17/16 1400  BP: (!) 91/56 (!) 78/56  Pulse: (!) 125 (!) 131  Resp: (!) 24 (!) 28  Temp: 97.3 F (36.3 C) 97.9 F (36.6 C)    Filed Weights   09/15/16 1300 09/16/16 0500 09/17/16 0700  Weight: (!) 489 lb (221.8 kg) (!) 487 lb (220.9 kg) (!) 490 lb (222.3 kg)    ROS: Review of Systems  Unable to perform ROS: Critical illness   Exam: Physical Exam  HENT:  Nose: No mucosal edema.  Unable to look into mouth at this time  Eyes: Conjunctivae and lids are normal.  Pupils pinpoint  Neck: Carotid bruit is not present. No thyroid mass and no thyromegaly present.  Cardiovascular: S1 normal and S2 normal.  An irregularly irregular rhythm present. Tachycardia present.  Exam reveals distant heart sounds. Exam reveals no gallop.   No murmur heard. Pulses:      Dorsalis pedis pulses are 2+ on the right side, and 2+ on the left side.  Respiratory: No respiratory distress. He has decreased breath sounds in the right middle field, the right lower field, the left middle field and the left lower field. He has wheezes in the right middle field and the left middle field. He has rhonchi in the right lower field and the left lower field. He has no rales.  GI: Soft. Bowel sounds are normal. There is no tenderness.  Musculoskeletal:       Right knee: He exhibits swelling.       Left knee: He exhibits swelling.       Right ankle: He exhibits swelling.       Left ankle: He exhibits swelling.  Lymphadenopathy:    He has no cervical adenopathy.  Neurological: He is unresponsive.  Unresponsive to painful stimuli  Skin: No rash noted. Nails show no clubbing.  Psychiatric:  Unable to assess  at this time secondary to unresponsive      Data Reviewed: Basic Metabolic Panel:  Recent Labs Lab 09/16/16 1650 09/16/16 2049 09/17/16 0017 09/17/16 0443 09/17/16 1217  NA 135 138 138 137  138 137  K 3.8 3.9 3.8 4.0  4.0 3.9  CL 102 106 106 106  106 106  CO2 _0 GLUCOSE 88 86 91 99  98 93  BUN 28* 29* 30* 28*  31* 30*  CREATININE 1.72* 1.80* 1.91* 1.92*  2.00* 1.99*  CALCIUM 8.5* 8.7* 8.6* 8.4*  8.3* 8.7*  MG 1.8 1.8 1.8 1.9 1.8  PHOS 2.5 2.3* 2.5 2.4* 2.3*   Liver Function Tests:  Recent Labs Lab 09/11/16 0458  09/12/16 0359  09/14/16 0513  09/16/16 0419  09/16/16 1650 09/16/16 2049 09/17/16 0017 09/17/16 0443 09/17/16 1217  AST 40  --  28  --  29  --  26  --   --   --   --  30  --   ALT 23  --  15*  --  10*  --  11*  --   --   --   --  13*  --   ALKPHOS 72  --  53  --  73  --  69  --   --   --   --  69  --   BILITOT 0.7  --  0.8  --  1.9*  --  2.7*  --   --   --   --  3.2*  --   PROT 6.9  --  5.2*  --  6.2*  --  6.3*  --   --   --   --  6.4*  --   ALBUMIN 2.8*  2.8*  < > 2.0*  < > 2.0*  < > 2.3*  2.4*  < > 2.4* 2.4* 2.5* 2.4*  2.5* 2.4*  < > = values in this interval not displayed. CBC:  Recent Labs Lab 09/12/16 0359 09/14/16 0513 09/15/16 0420 09/16/16 0419 09/17/16 0443  WBC 12.4* 17.6* 14.8* 16.0* 17.0*  HGB 9.8* 10.4* 10.0* 10.0* 9.9*  HCT 31.0* 32.5* 30.8* 30.9* 30.5*  MCV 80.8 79.4* 80.8 79.0* 79.9*  PLT 125* 139* 110* 106* 102*   Cardiac Enzymes:  Recent Labs Lab 09/13/16 0811 09/13/16 1237 09/13/16 1815  TROPONINI 0.05* 0.06* 0.05*   BNP (last 3 results)  Recent Labs  09/06/2016 0750  BNP 508.0*     CBG:  Recent Labs Lab 09/16/16 1943 09/16/16 2343 09/17/16 0401 09/17/16 0715 09/17/16 1146  GLUCAP 75 87 102* 86 83    Recent Results (from the past 240 hour(s))  Culture, respiratory (NON-Expectorated)     Status: None   Collection Time: 09/14/2016  6:32 PM  Result Value Ref Range Status    Specimen Description TRACHEAL ASPIRATE  Final   Special Requests NONE  Final   Gram Stain   Final    FEW WBC PRESENT, PREDOMINANTLY PMN MODERATE GRAM POSITIVE COCCI IN CHAINS IN PAIRS MODERATE GRAM NEGATIVE RODS    Culture   Final    Consistent with normal respiratory flora. Performed at [facility], [place] [address] [place], [place] [address]    Report Status 09/10/2016 FINAL  Final  Culture, respiratory (NON-Expectorated)     Status: None   Collection Time: 09/14/16 12:21 AM  Result Value Ref Range Status   Specimen Description TRACHEAL ASPIRATE  Final   Special Requests NONE  Final   Gram Stain   Final    ABUNDANT WBC PRESENT, PREDOMINANTLY PMN NO SQUAMOUS EPITHELIAL CELLS SEEN NO ORGANISMS SEEN    Culture   Final    NO GROWTH Performed at [facility], [place] [address] [place], [place] [address]    Report Status 09/16/2016 FINAL  Final     Studies: Dg Chest Port 1 View  Result Date: 09/16/2016 CLINICAL DATA:  Respiratory failure. EXAM: PORTABLE CHEST 1 VIEW COMPARISON:  09/14/2016 FINDINGS: Image quality is degraded by patient body habitus. Endotracheal tube terminates at the level of the clavicles, 6.5 cm above the carina. Enteric tube courses towards the upper abdomen with tip not imaged. Right jugular catheter terminates over the high right atrium. The cardiac silhouette is largely obscured. Diffuse bilateral airspace opacities have mildly worsened, most notably in the right lung base. Multiple areas of dense consolidation are again seen with some air bronchograms. No pneumothorax is identified. IMPRESSION: Mild worsening of diffuse bilateral airspace opacities which may reflect pneumonia or edema. Electronically Signed   By: Logan Bores M.D.   On: 09/16/2016 07:30    Scheduled Meds: . amiodarone  400 mg Per Tube BID  . chlorhexidine gluconate (MEDLINE KIT)  15 mL Mouth Rinse BID  . feeding supplement (PRO-STAT SUGAR FREE 64)  30 mL Per Tube Q4H  . heparin  subcutaneous  5,000 Units Subcutaneous Q8H  . insulin aspart  0-15 Units Subcutaneous Q4H  . insulin glargine  10 Units Subcutaneous Daily  . ipratropium-albuterol  3 mL Nebulization Q6H  . mouth rinse  15 mL Mouth Rinse 10 times per day  . pantoprazole sodium  40 mg Per Tube Q1200  . sennosides  5 mL Per Tube BID  . sodium chloride flush  10-40 mL Intracatheter Q12H  . sodium chloride flush  10-40 mL Intracatheter Q12H   Continuous Infusions: . sodium chloride Stopped (09/17/16 0800)  . albumin human    . dextrose 30 mL/hr at 09/17/16 1400  . feeding supplement (VITAL HIGH PROTEIN) 1,000 mL (09/17/16 1400)  . fentaNYL infusion INTRAVENOUS 400 mcg/hr (09/17/16 1400)  . norepinephrine (LEVOPHED) Adult infusion Stopped (09/16/16 0811)  . phenylephrine (NEO-SYNEPHRINE) Adult infusion 350.133 mcg/min (09/17/16 1400)  . pureflow 2,000 mL/hr at 09/17/16 0600    Assessment/Plan:  1. Cardiac arrest With multiorgan failure. Suspect due to arrhythmia related with an EF of 10% seen on echocardiogram.  Overall prognosis poor with anoxic encephalopathy. No significant improvement in his conditionChance of recovery unlikelyOff mjilrinone 2. 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 On milrinone.  With EF of 10% his overall prognosis is poor.  Supportive care and blood pressure support 3. Acute kidney injury secondary to cardiac arrest, CT scan with contrast. Continue CRRT 4. Anoxic encephalopathy with brain edema. Acute encephalopathy. Patient currently on sedation with fentanyl  Previous EEG showing no seizures. Patient started on valproate sodium.  Neurology follow up.  Palliative care team following prognosis poor 5. Acute hypoxic respiratory failure. Patient has been on 70% FiO2 6. Hypotension- Continue Levophed  7. Type 2 diabetes mellitus. On sliding scale and low-dose Levemir. 8. Rapid Atrial fibrillation. Off heparin drip. Off amiodarone drip   9. Morbid obesity and likely sleep apnea. 10. Anemia likely anemia of chronic disease     Code Status:     Code Status Orders        Start     Ordered   09/04/2016 1005  Full code  Continuous     09/24/2016 1007    Code Status History    Date Active Date Inactive Code Status Order ID Comments User Context   09/02/2016  8:30 AM 08/29/2016 10:08 AM Full Code [id number]  Loletha Grayer, MD ED     Family Communication: No family currently available Consultants:  Critical care specialist  Cardiology  Neurology  Nephrology  Palliative care consultation  Time spent: 26 minutes  [facility] [place], [facility]           [email or web address]

## 2016-09-17 NOTE — Progress Notes (Signed)
Patient found to be vomiting with copious amounts of green emesis and clear/white secretions. Tube feeding stopped and OG hooked to suction. Oral meds held. Varughese NP notified. Will continue to monitor.

## 2016-09-17 NOTE — Progress Notes (Signed)
Pt's left foot twitching for approx 5 minutes, Dr Sung AmabileSimonds informed.  We will not intervene at this time

## 2016-09-17 NOTE — Progress Notes (Addendum)
Unable to restart CRRT with cartridge change, alarms re arterial air, venous pressures, and effluent pressure.  Red lumen on pts trialysis cath would not draw back blood.  Stopped CRRT again and instliled cathflo for 1 hour.  After cathflo withdrawn red lumen drew back only with considerable effort.  CRRT cartridge changed again and am attempting to run at this time.  Access pressures are high at -235.  NxStage machine running with caution, blood flow rate is only 100 ml/min

## 2016-09-17 NOTE — Progress Notes (Signed)
Ch. saw family in hallway. Family continues to struggle with son's (Pt.) condition. DNR has been added to chart. Ch. ministered spiritual support to family.

## 2016-09-17 NOTE — Progress Notes (Signed)
Varughese NP at bedside to preform dialysis catheter exchange. Time out preformed. VSS, will continue to monitor.

## 2016-09-17 NOTE — Progress Notes (Signed)
PULMONARY / CRITICAL CARE MEDICINE   Name: Isaiah [name] MRN: [id number] DOB: May 12, 1965    ADMISSION DATE:  09/24/16  PT PROFILE: 51 yo male with DM, CAF, morbid obesity, cardiomyopathy suffered out of hospital cardiac arrest requiring prolonged CPR/ACLS. Underwent hypothermia protocol. Hospitalization complicated by MODS.  MAJOR EVENTS/TEST RESULTS: 06/11 Admission after out of hospital cardiac arrest 06/11 CTA chest: No PE noted on suboptimal study 06/11 CT head: No acute abnormalities 06/11 EEG: abnormal EEG due to a burst suppression rhythm 06/11 neurology consultation 06/12 anuric renal failure, shock requiring high dose vasopressors 06/12 echocardiogram: LVEF 10% 06/13 Rewarmed. Possible seizure. CRRT initiated 06/13 CT head: cerebral edema 06/13 EEG: This is an abnormal electroencephalogram due the presence of PLEDs that are triphasic in morphology.  Although most prevalent over the left hemisphere, this activity can be noted over the right hemisphere independently and also have a more generalized distribution 06/13 per neurology consultant, prognosis for neurologic recovery deemed poor 06/14 palliative care consultation 06/15 progressive pulmonary infiltrates c/w edema vs ARDS 06/15 goals of care discussion with family. Full code desired 06/15 EEG: This is an abnormal electroencephalogram secondary to the absence of cerebral activity 06/16 worsening oxygenation. Worsening pulmonary infiltrates. Remains on high dose vasopressors. Remains on CRRT. Remains minimally responsive 06/17 worsening pulmonary infiltrates and left pleural effusion. Remains on CRRT. Remains on high-dose vasopressors. Remains minimally responsive 06/18 severe ARDS pattern on x-ray requiring high FiO2/PEEP. Remains on CRRT. On maximum dose vasopressors. Remains minimally responsive but with some spontaneous ventilatory effort 06/18 mother and father updated at bedside. Goals of care again addressed. They  were informed that he is on maximum support. Made DNR in event of cardiac arrest. To consider discontinuation of life-sustaining therapies later in the week 06/19 Tachyarrhythmias. Amiodarone increased. Milrinone stopped. Remains on maximum support with vasopressors, mech vent and CRRT 06/20 No major change overall. Remains on maximum vent support, high dose vasopressors, CRRT. Remains minimally responsive. Vasopressin discontinued. Amiodarone changed to enteral route 06/21 no major changes. No sign of meaningful improvement   INDWELLING DEVICES:: ETT 06/11 >>  L IJ CVL 06/11 >> 06/15 R IJ HD cath 06/13 >>  RUE PICC 06/15 >>    MICRO DATA: MRSA PCR 06/11 >> NEG Resp 06/11 >> consistent with oral flora Blood 06/11 >> NEG Blood 06/18 >> NEG Resp 06/18 >> resp flora  ANTIMICROBIALS:  Ceftriaxone 06/11 X 1 dose  SUBJECTIVE:  RASS -4, -5. Remains on fentanyl to maintain patient-ventilator synchrony. Remains minimally responsive to painful stimuli.  VITAL SIGNS: BP (!) 87/65   Pulse (!) 119   Temp 97.5 F (36.4 C)   Resp (!) 23   Ht 6' (1.829 m)   Wt (!) 490 lb (222.3 kg)   SpO2 98%   BMI 66.46 kg/m   HEMODYNAMICS:    VENTILATOR SETTINGS: Vent Mode: PRVC FiO2 (%):  [70 %-100 %] 100 % Set Rate:  [24 bmp] 24 bmp Vt Set:  [550 mL] 550 mL PEEP:  [15 cmH20] 15 cmH20 Plateau Pressure:  [31 cmH20] 31 cmH20  INTAKE / OUTPUT: I/O last 3 completed shifts: In: 6494.2 [I.V.:6464.2; NG/GT:30] Out: 2839 [Urine:245; Emesis/NG output:50; WUJWJ:1914]  PHYSICAL EXAMINATION: General: Morbidly obese, fentanyl infusion, RASS -4, -5  Neuro: Spontaneous ventilatory effort, minimal spontaneous movement, occasional myoclonic-like movement HEENT: NCAT, severe scleral edema Cardiovascular: Tachy, IR IR, no murmurs noted Lungs: coarse bilateral breath sounds without wheezes Abdomen: Severely obese, bowel sounds not audible Ext: cool, diminished to absent pulses, symmetric edema in  all  extremities  LABS:  BMET  Recent Labs Lab 09/16/16 2049 09/17/16 0017 09/17/16 0443  NA 138 138 137  138  K 3.9 3.8 4.0  4.0  CL 106 106 106  106  CO2 26 27 27  27   BUN 29* 30* 28*  31*  CREATININE 1.80* 1.91* 1.92*  2.00*  GLUCOSE 86 91 99  98    Electrolytes  Recent Labs Lab 09/16/16 2049 09/17/16 0017 09/17/16 0443  CALCIUM 8.7* 8.6* 8.4*  8.3*  MG 1.8 1.8 1.9  PHOS 2.3* 2.5 2.4*    CBC  Recent Labs Lab 09/15/16 0420 09/16/16 0419 09/17/16 0443  WBC 14.8* 16.0* 17.0*  HGB 10.0* 10.0* 9.9*  HCT 30.8* 30.9* 30.5*  PLT 110* 106* 102*    Coag's  Recent Labs Lab 09/10/16 1634 09/11/16 0034  APTT 63* 91*    Sepsis Markers No results for input(s): LATICACIDVEN, PROCALCITON, O2SATVEN in the last 168 hours.  ABG  Recent Labs Lab 09/13/16 0500 09/13/16 0650 09/14/16 0400  PHART 7.29* 7.22* 7.25*  PCO2ART 57* 63* 61*  PO2ART 52* UNABLE TO CALCULATE. 61*    Liver Enzymes  Recent Labs Lab 09/14/16 0513  09/16/16 0419  09/16/16 2049 09/17/16 0017 09/17/16 0443  AST 29  --  26  --   --   --  30  ALT 10*  --  11*  --   --   --  13*  ALKPHOS 73  --  69  --   --   --  69  BILITOT 1.9*  --  2.7*  --   --   --  3.2*  ALBUMIN 2.0*  < > 2.3*  2.4*  < > 2.4* 2.5* 2.4*  2.5*  < > = values in this interval not displayed.  Cardiac Enzymes  Recent Labs Lab 09/13/16 0811 09/13/16 1237 09/13/16 1815  TROPONINI 0.05* 0.06* 0.05*    Glucose  Recent Labs Lab 09/16/16 1158 09/16/16 1641 09/16/16 1943 09/16/16 2343 09/17/16 0401 09/17/16 0715  GLUCAP 103* 80 75 87 102* 86    CXR: NNF   ASSESSMENT / PLAN:  PULMONARY A: Acute hypoxemic respiratory failure VDRF post cardiac arrest Severe pulmonary edema +/- ARDS  P:   Cont full vent support - settings reviewed and/or adjusted Cont vent bundle Daily SBT if/when meets criteria   CARDIOVASCULAR A:  S/P out of hospital cardiac arrest 06/11 Severe cardiomyopathy (LVEF  10%) Chronic atrial fibrillation Persistent cardiogenic shock P:  Continue vasopressors for MAP goal > 65 mmHg Amiodarone changed to enteral 06/20 DNR in event of cardiac arrest  RENAL A:   Oligo-anuric AKI post cardiac arrest Hypervolemia P:   Monitor BMET intermittently Monitor I/Os Correct electrolytes as indicated Continue CRRT per Renal Service  GASTROINTESTINAL A:   Morbid obesity Gastric ileus P:   SUP: Enteral PPI TF protocol - trickle  HEMATOLOGIC A:   Mild ICU acquired anemia without acute blood loss Mild thrombocytopenia P:  DVT px: SQ heparin Monitor CBC intermittently Transfuse per usual guidelines   INFECTIOUS A:   No definite infection identified P:   Monitor temp, WBC count Micro and abx as above   ENDOCRINE A:   Type II DM, controlled P:   Continue Lantus - dose reduced 06/21 Continue SSI  NEUROLOGIC A:   Severe anoxic encephalopathy Cerebral edema  Very poor neurological prognosis P:   RASS goal: -1, -2 Continue fentanyl infusion for ventilator synchrony Palliative Care working with family  Likely terminal  extubation 06/23   FAMILY: Father updated   CCM time: 30 mins The above time includes time spent in consultation with patient and/or family members and reviewing care plan on multidisciplinary rounds  Billy Fischer, MD PCCM service Mobile [phone] Pager [phone]  09/17/2016, 11:09 AM

## 2016-09-18 ENCOUNTER — Inpatient Hospital Stay: Payer: BLUE CROSS/BLUE SHIELD

## 2016-09-18 DIAGNOSIS — G931 Anoxic brain damage, not elsewhere classified: Secondary | ICD-10-CM

## 2016-09-18 DIAGNOSIS — Z515 Encounter for palliative care: Secondary | ICD-10-CM

## 2016-09-18 LAB — RENAL FUNCTION PANEL
ALBUMIN: 2.3 g/dL — AB (ref 3.5–5.0)
Albumin: 2.4 g/dL — ABNORMAL LOW (ref 3.5–5.0)
Albumin: 2.3 g/dL — ABNORMAL LOW (ref 3.5–5.0)
Anion gap: 6 (ref 5–15)
Anion gap: 6 (ref 5–15)
Anion gap: 6 (ref 5–15)
BUN: 33 mg/dL — ABNORMAL HIGH (ref 6–20)
BUN: 36 mg/dL — ABNORMAL HIGH (ref 6–20)
BUN: 36 mg/dL — ABNORMAL HIGH (ref 6–20)
CALCIUM: 8.4 mg/dL — AB (ref 8.9–10.3)
CALCIUM: 8.3 mg/dL — AB (ref 8.9–10.3)
CHLORIDE: 104 mmol/L (ref 101–111)
CO2: 25 mmol/L (ref 22–32)
CO2: 25 mmol/L (ref 22–32)
CO2: 25 mmol/L (ref 22–32)
CREATININE: 2.14 mg/dL — AB (ref 0.61–1.24)
CREATININE: 2.29 mg/dL — AB (ref 0.61–1.24)
CREATININE: 2.61 mg/dL — AB (ref 0.61–1.24)
Calcium: 8.5 mg/dL — ABNORMAL LOW (ref 8.9–10.3)
Chloride: 105 mmol/L (ref 101–111)
Chloride: 104 mmol/L (ref 101–111)
GFR calc Af Amer: 37 mL/min — ABNORMAL LOW (ref >60)
GFR calc non Af Amer: 27 mL/min — ABNORMAL LOW (ref >60)
GFR, EST AFRICAN AMERICAN: 40 mL/min — AB (ref >60)
GFR, EST AFRICAN AMERICAN: 32 mL/min — AB (ref >60)
GFR, EST NON AFRICAN AMERICAN: 35 mL/min — AB (ref >60)
GFR, EST NON AFRICAN AMERICAN: 32 mL/min — AB (ref >60)
GLUCOSE: 130 mg/dL — AB (ref 65–99)
Glucose, Bld: 127 mg/dL — ABNORMAL HIGH (ref 65–99)
Glucose, Bld: 107 mg/dL — ABNORMAL HIGH (ref 65–99)
PHOSPHORUS: 2.4 mg/dL — AB (ref 2.5–4.6)
PHOSPHORUS: 2.3 mg/dL — AB (ref 2.5–4.6)
Phosphorus: 2.7 mg/dL (ref 2.5–4.6)
Potassium: 3.9 mmol/L (ref 3.5–5.1)
Potassium: 3.9 mmol/L (ref 3.5–5.1)
Potassium: 4.1 mmol/L (ref 3.5–5.1)
SODIUM: 136 mmol/L (ref 135–145)
SODIUM: 135 mmol/L (ref 135–145)
Sodium: 135 mmol/L (ref 135–145)

## 2016-09-18 LAB — CBC
HCT: 29.7 % — ABNORMAL LOW (ref 40.0–52.0)
HEMOGLOBIN: 9.7 g/dL — AB (ref 13.0–18.0)
MCH: 26.6 pg (ref 26.0–34.0)
MCHC: 32.6 g/dL (ref 32.0–36.0)
MCV: 81.6 fL (ref 80.0–100.0)
PLATELETS: 115 10*3/uL — AB (ref 150–440)
RBC: 3.64 MIL/uL — AB (ref 4.40–5.90)
RDW: 19.2 % — ABNORMAL HIGH (ref 11.5–14.5)
WBC: 22.5 10*3/uL — AB (ref 3.8–10.6)

## 2016-09-18 LAB — GLUCOSE, CAPILLARY
GLUCOSE-CAPILLARY: 105 mg/dL — AB (ref 65–99)
GLUCOSE-CAPILLARY: 116 mg/dL — AB (ref 65–99)
GLUCOSE-CAPILLARY: 131 mg/dL — AB (ref 65–99)
Glucose-Capillary: 101 mg/dL — ABNORMAL HIGH (ref 65–99)
Glucose-Capillary: 122 mg/dL — ABNORMAL HIGH (ref 65–99)
Glucose-Capillary: 97 mg/dL (ref 65–99)

## 2016-09-18 LAB — PROTIME-INR
INR: 1.15
Prothrombin Time: 14.8 seconds (ref 11.4–15.2)

## 2016-09-18 LAB — APTT: APTT: 42 s — AB (ref 24–36)

## 2016-09-18 LAB — MAGNESIUM
MAGNESIUM: 1.8 mg/dL (ref 1.7–2.4)
Magnesium: 1.8 mg/dL (ref 1.7–2.4)
Magnesium: 1.8 mg/dL (ref 1.7–2.4)

## 2016-09-18 LAB — HEPARIN LEVEL (UNFRACTIONATED): Heparin Unfractionated: 0.1 IU/mL — ABNORMAL LOW (ref 0.30–0.70)

## 2016-09-18 MED ORDER — STERILE WATER FOR INJECTION IJ SOLN
INTRAMUSCULAR | Status: AC
  Administered 2016-09-18: 10 mL
  Filled 2016-09-18: qty 10

## 2016-09-18 MED ORDER — STERILE WATER FOR INJECTION IJ SOLN
INTRAMUSCULAR | Status: AC
  Administered 2016-09-18: 09:00:00
  Filled 2016-09-18: qty 10

## 2016-09-18 MED ORDER — VANCOMYCIN HCL IN DEXTROSE 1-5 GM/200ML-% IV SOLN
1000.0000 mg | Freq: Once | INTRAVENOUS | Status: AC
  Administered 2016-09-18: 1000 mg via INTRAVENOUS
  Filled 2016-09-18: qty 200

## 2016-09-18 MED ORDER — HEPARIN BOLUS VIA INFUSION
4000.0000 [IU] | Freq: Once | INTRAVENOUS | Status: AC
  Administered 2016-09-18: 4000 [IU] via INTRAVENOUS
  Filled 2016-09-18: qty 4000

## 2016-09-18 MED ORDER — ALTEPLASE 2 MG IJ SOLR
4.0000 mg | Freq: Once | INTRAMUSCULAR | Status: AC
  Administered 2016-09-18: 4 mg

## 2016-09-18 MED ORDER — VANCOMYCIN HCL IN DEXTROSE 750-5 MG/150ML-% IV SOLN
750.0000 mg | Freq: Once | INTRAVENOUS | Status: AC
  Administered 2016-09-18: 750 mg via INTRAVENOUS
  Filled 2016-09-18: qty 150

## 2016-09-18 MED ORDER — PIPERACILLIN-TAZOBACTAM 3.375 G IVPB
3.3750 g | Freq: Three times a day (TID) | INTRAVENOUS | Status: DC
  Administered 2016-09-18 – 2016-09-19 (×4): 3.375 g via INTRAVENOUS
  Filled 2016-09-18 (×8): qty 50

## 2016-09-18 MED ORDER — HEPARIN (PORCINE) IN NACL 100-0.45 UNIT/ML-% IJ SOLN
1550.0000 [IU]/h | INTRAMUSCULAR | Status: DC
  Administered 2016-09-18: 1000 [IU]/h via INTRAVENOUS
  Filled 2016-09-18 (×2): qty 250

## 2016-09-18 MED ORDER — VANCOMYCIN HCL 10 G IV SOLR
1250.0000 mg | INTRAVENOUS | Status: DC
  Administered 2016-09-19: 1250 mg via INTRAVENOUS
  Filled 2016-09-18 (×3): qty 1250

## 2016-09-18 NOTE — Progress Notes (Signed)
Patient ID: Isaiah [name], male   DOB: 03-15-1966, 51 y.o.   MRN: [id number]   [facility] PROGRESS NOTE  Isaiah [name] QVZ:[id number] DOB: Mar 23, 1966 DOA: 09/13/2016 PCP: Patient, No Pcp Per  HPI/Subjective: Remains on the ventilator. No significant improvement   Objective: Vitals:   09/18/16 1400 09/18/16 1500  BP: 91/63 (!) 89/68  Pulse: 94 (!) 114  Resp: (!) 25 13  Temp: 99 F (37.2 C) 99.1 F (37.3 C)    Filed Weights   09/16/16 0500 09/17/16 0700 09/18/16 0500  Weight: (!) 487 lb (220.9 kg) (!) 490 lb (222.3 kg) (!) 432 lb (196 kg)    ROS: Review of Systems  Unable to perform ROS: Critical illness   Exam: Physical Exam  HENT:  Nose: No mucosal edema.  Unable to look into mouth at this time  Eyes: Conjunctivae and lids are normal.  Pupils pinpoint  Neck: Carotid bruit is not present. No thyroid mass and no thyromegaly present.  Cardiovascular: S1 normal and S2 normal.  An irregularly irregular rhythm present. Tachycardia present.  Exam reveals distant heart sounds. Exam reveals no gallop.   No murmur heard. Pulses:      Dorsalis pedis pulses are 2+ on the right side, and 2+ on the left side.  Respiratory: No respiratory distress. He has decreased breath sounds in the right middle field, the right lower field, the left middle field and the left lower field. He has wheezes in the right middle field and the left middle field. He has rhonchi in the right lower field and the left lower field. He has no rales.  GI: Soft. Bowel sounds are normal. There is no tenderness.  Musculoskeletal:       Right knee: He exhibits swelling.       Left knee: He exhibits swelling.       Right ankle: He exhibits swelling.       Left ankle: He exhibits swelling.  Lymphadenopathy:    He has no cervical adenopathy.  Neurological: He is unresponsive.  Unresponsive to painful stimuli  Skin: No rash noted. Nails show no clubbing.  Psychiatric:  Unable to assess at this time  secondary to unresponsive      Data Reviewed: Basic Metabolic Panel:  Recent Labs Lab 09/17/16 0443 09/17/16 1217 09/17/16 2101 09/18/16 0505 09/18/16 1304  NA 137  138 137 135 136 135  K 4.0  4.0 3.9 3.7 3.9 3.9  CL 106  106 106 106 105 104  CO2 _0 GLUCOSE 99  98 93 101* 127* 130*  BUN 28*  31* 30* 35* 33* 36*  CREATININE 1.92*  2.00* 1.99* 2.16* 2.14* 2.29*  CALCIUM 8.4*  8.3* 8.7* 8.2* 8.4* 8.5*  MG 1.9 1.8 1.8 1.8 1.8  PHOS 2.4* 2.3* 2.2* 2.4* 2.3*   Liver Function Tests:  Recent Labs Lab 09/12/16 0359  09/14/16 0513  09/16/16 0419  09/17/16 0443 09/17/16 1217 09/17/16 2101 09/18/16 0505 09/18/16 1304  AST 28  --  29  --  26  --  30  --   --   --   --   ALT 15*  --  10*  --  11*  --  13*  --   --   --   --   ALKPHOS 53  --  73  --  69  --  69  --   --   --   --   BILITOT 0.8  --  1.9*  --  2.7*  --  3.2*  --   --   --   --   PROT 5.2*  --  6.2*  --  6.3*  --  6.4*  --   --   --   --   ALBUMIN 2.0*  < > 2.0*  < > 2.3*  2.4*  < > 2.4*  2.5* 2.4* 2.2* 2.4* 2.3*  < > = values in this interval not displayed. CBC:  Recent Labs Lab 09/14/16 0513 09/15/16 0420 09/16/16 0419 09/17/16 0443 09/18/16 0505  WBC 17.6* 14.8* 16.0* 17.0* 22.5*  HGB 10.4* 10.0* 10.0* 9.9* 9.7*  HCT 32.5* 30.8* 30.9* 30.5* 29.7*  MCV 79.4* 80.8 79.0* 79.9* 81.6  PLT 139* 110* 106* 102* 115*   Cardiac Enzymes:  Recent Labs Lab 09/13/16 0811 09/13/16 1237 09/13/16 1815  TROPONINI 0.05* 0.06* 0.05*   BNP (last 3 results)  Recent Labs  09/03/2016 0750  BNP 508.0*     CBG:  Recent Labs Lab 09/17/16 1930 09/18/16 0019 09/18/16 0405 09/18/16 0727 09/18/16 1134  GLUCAP 105* 101* 105* 116* 131*    Recent Results (from the past 240 hour(s))  Culture, respiratory (NON-Expectorated)     Status: None   Collection Time: 09/14/16 12:21 AM  Result Value Ref Range Status   Specimen Description TRACHEAL ASPIRATE  Final   Special Requests NONE   Final   Gram Stain   Final    ABUNDANT WBC PRESENT, PREDOMINANTLY PMN NO SQUAMOUS EPITHELIAL CELLS SEEN NO ORGANISMS SEEN    Culture   Final    NO GROWTH Performed at [facility], [place] [address] [place], [place] [address]    Report Status 09/16/2016 FINAL  Final     Studies: Dg Chest Port 1 View  Result Date: 09/18/2016 CLINICAL DATA:  Respiratory failure. EXAM: PORTABLE CHEST 1 VIEW COMPARISON:  09/17/2016 . FINDINGS: Endotracheal tube, NG tube, right IJ sheath, left PICC line in stable position. Cardiomegaly with diffuse dense bilateral pulmonary alveolar infiltrates/edema. No interim change. Small pleural effusions cannot be excluded. No pneumothorax . IMPRESSION: 1. Lines and tubes in stable position. 2. Cardiomegaly with diffuse dense bilateral pulmonary alveolar infiltrates/edema. No interim change. Small bilateral pleural effusions cannot be excluded . Electronically Signed   By: Marcello Moores  Register   On: 09/18/2016 06:21   Dg Chest Port 1 View  Result Date: 09/17/2016 CLINICAL DATA:  Central line placement. EXAM: PORTABLE CHEST 1 VIEW COMPARISON:  Chest radiograph September 16, 2016 FINDINGS: Habitus limited examination. Endotracheal tube tip projects approximately 5 cm above the carina. Dialysis catheter via RIGHT internal jugular venous approach projecting at proximal atrium. LEFT PICC distal tip at approximate brachiocephalic confluence. Nasogastric tube in place, and not well visualized distal to the mid esophageal region. Cardiomegaly and pulmonary vascular congestion, interstitial prominence and slightly decreased alveolar airspace opacities. IMPRESSION: Habitus limited examination. No apparent change in life-support lines. Cardiomegaly and slightly decreased pulmonary edema. Electronically Signed   By: Elon Alas M.D.   On: 09/17/2016 20:25    Scheduled Meds: . amiodarone  400 mg Per Tube BID  . chlorhexidine gluconate (MEDLINE KIT)  15 mL Mouth Rinse BID  . feeding  supplement (PRO-STAT SUGAR FREE 64)  30 mL Per Tube Q4H  . insulin aspart  0-15 Units Subcutaneous Q4H  . insulin glargine  10 Units Subcutaneous Daily  . ipratropium-albuterol  3 mL Nebulization Q6H  . mouth rinse  15 mL Mouth Rinse 10 times per day  .  pantoprazole sodium  40 mg Per Tube Q1200  . sennosides  5 mL Per Tube BID  . sodium chloride flush  10-40 mL Intracatheter Q12H  . sodium chloride flush  10-40 mL Intracatheter Q12H   Continuous Infusions: . sodium chloride Stopped (09/17/16 0800)  . albumin human    . dextrose 30 mL/hr at 09/18/16 1500  . feeding supplement (VITAL HIGH PROTEIN) Stopped (09/18/16 0700)  . fentaNYL infusion INTRAVENOUS 400 mcg/hr (09/18/16 1515)  . heparin 1,000 Units/hr (09/18/16 1500)  . norepinephrine (LEVOPHED) Adult infusion 10.987 mcg/min (09/18/16 1500)  . phenylephrine (NEO-SYNEPHRINE) Adult infusion 360 mcg/min (09/18/16 1500)  . piperacillin-tazobactam (ZOSYN)  IV 3.375 g (09/18/16 1356)  . pureflow 2,000 mL/hr at 09/18/16 1151  . [START ON Oct 05, 2016] vancomycin      Assessment/Plan:  1. Cardiac arrest With multiorgan failure. Suspect due to arrhythmia related with an EF of 10% seen on echocardiogram.  Overall prognosis poor with anoxic encephalopathy. No significant improvement in his conditionChance of recovery unlikelyOff mjilrinone 2. 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 On milrinone.  With EF of 10% his overall prognosis is poor.  Supportive care and blood pressure support 3. Acute kidney injury secondary to cardiac arrest, CT scan with contrast. Continue CRRT 4. Anoxic encephalopathy with brain edema. Acute encephalopathy. Patient currently on sedation with fentanyl  Previous EEG showing no seizures. Patient started on valproate sodium.  Neurology follow up.  Palliative care team following prognosis poor 5. Acute hypoxic respiratory failure. Patient has been on 70% FiO2 6. Hypotension-  Continue Levophed  7. Type 2 diabetes mellitus. On sliding scale and low-dose Levemir. 8. Rapid Atrial fibrillation. Off heparin drip. Off amiodarone drip  9. Morbid obesity and likely sleep apnea. 10. Anemia likely anemia of chronic disease     Code Status:     Code Status Orders        Start     Ordered   09/14/2016 1005  Full code  Continuous     08/31/2016 1007    Code Status History    Date Active Date Inactive Code Status Order ID Comments User Context   09/21/2016  8:30 AM 09/13/2016 10:08 AM Full Code [id number]  Loletha Grayer, MD ED     Family Communication: No family currently available Consultants:  Critical care specialist  Cardiology  Neurology  Nephrology  Palliative care consultation  Time spent: 26 minutes  [place], [facility]           googel

## 2016-09-18 NOTE — Progress Notes (Signed)
PHARMACY - CRITICAL CARE PROGRESS NOTE  Pharmacy Consult CRRT Dosage Adjustments   No Known Allergies  Patient Measurements: Height: 6' (182.9 cm) Weight: (!) 432 lb (196 kg) IBW/kg (Calculated) : 77.6  Vital Signs: Temp: 99 F (37.2 C) (06/22 1300) Temp Source: Core (Comment) (06/22 1200) BP: 92/68 (06/22 1300) Pulse Rate: 90 (06/22 1300) Intake/Output from previous day: 06/21 0701 - 06/22 0700 In: 5384.8 [I.V.:5014.6; NG/GT:320.2; IV Piggyback:50] Out: 2615 [Urine:78; Emesis/NG output:350] Intake/Output from this shift: Total I/O In: 1657.2 [I.V.:1295.2; NG/GT:62; IV Piggyback:300] Out: 380 [Urine:10; Other:370] Vent settings for last 24 hours: Vent Mode: PRVC FiO2 (%):  [70 %-100 %] 100 % Set Rate:  [24 bmp] 24 bmp Vt Set:  [550 mL] 550 mL PEEP:  [15 cmH20] 15 cmH20 Plateau Pressure:  [32 cmH20-36 cmH20] 36 cmH20  Labs:  Recent Labs  09/16/16 0419  09/17/16 0443  09/17/16 2101 09/18/16 0505 09/18/16 1304  WBC 16.0*  --  17.0*  --   --  22.5*  --   HGB 10.0*  --  9.9*  --   --  9.7*  --   HCT 30.9*  --  30.5*  --   --  29.7*  --   PLT 106*  --  102*  --   --  115*  --   APTT  --   --   --   --   --   --  42*  INR  --   --   --   --   --   --  1.15  CREATININE 1.81*  2.00*  < > 1.92*  2.00*  < > 2.16* 2.14* 2.29*  MG 1.8  < > 1.9  < > 1.8 1.8 1.8  PHOS 2.7  < > 2.4*  < > 2.2* 2.4* 2.3*  ALBUMIN 2.3*  2.4*  < > 2.4*  2.5*  < > 2.2* 2.4* 2.3*  PROT 6.3*  --  6.4*  --   --   --   --   AST 26  --  30  --   --   --   --   ALT 11*  --  13*  --   --   --   --   ALKPHOS 69  --  69  --   --   --   --   BILITOT 2.7*  --  3.2*  --   --   --   --   < > = values in this interval not displayed. Estimated Creatinine Clearance: 69.7 mL/min (A) (by C-G formula based on SCr of 2.29 mg/dL (H)).   Recent Labs  09/18/16 0405 09/18/16 0727 09/18/16 1134  GLUCAP 105* 116* 131*    Assessment: 51 y/o M s/p cardiac arrest and Code ICE on ventilation, sedation,  vasopressors, and CRRT.   Plan:  No medications require adjustment for CRRT at present.   Luisa Harthristy, [place] Korver D 09/18/2016,2:34 PM

## 2016-09-18 NOTE — Progress Notes (Signed)
IV heparin infusing after requested afrom Dr Thedore MinsSingh and he consulted with pharmacist  And ordered; d/t repeated clotting of CRRT access.  CRRT running at this time, balance chamber pressures somewhat high but machine running w/o complication  DR Conforti alerted this AM writer observed a prolonged run of VTach in room, CCMD called but they felt this was artifact.  Pt was not moving at the time.  No further orders at this time

## 2016-09-18 NOTE — Progress Notes (Signed)
PULMONARY / CRITICAL CARE MEDICINE   Name: Isaiah [name] MRN: [id number] DOB: [birth year]    ADMISSION DATE:  09/20/2016  PT PROFILE: 51 yo male with DM, CAF, morbid obesity, cardiomyopathy suffered out of hospital cardiac arrest requiring prolonged CPR/ACLS. Underwent hypothermia protocol. Hospitalization complicated by MODS.  MAJOR EVENTS/TEST RESULTS: 06/11 Admission after out of hospital cardiac arrest 06/11 CTA chest: No PE noted on suboptimal study 06/11 CT head: No acute abnormalities 06/11 EEG: abnormal EEG due to a burst suppression rhythm 06/11 neurology consultation 06/12 anuric renal failure, shock requiring high dose vasopressors 06/12 echocardiogram: LVEF 10% 06/13 Rewarmed. Possible seizure. CRRT initiated 06/13 CT head: cerebral edema 06/13 EEG: This is an abnormal electroencephalogram due the presence of PLEDs that are triphasic in morphology.  Although most prevalent over the left hemisphere, this activity can be noted over the right hemisphere independently and also have a more generalized distribution 06/13 per neurology consultant, prognosis for neurologic recovery deemed poor 06/14 palliative care consultation 06/15 progressive pulmonary infiltrates c/w edema vs ARDS 06/15 goals of care discussion with family. Full code desired 06/15 EEG: This is an abnormal electroencephalogram secondary to the absence of cerebral activity 06/16 worsening oxygenation. Worsening pulmonary infiltrates. Remains on high dose vasopressors. Remains on CRRT. Remains minimally responsive 06/17 worsening pulmonary infiltrates and left pleural effusion. Remains on CRRT. Remains on high-dose vasopressors. Remains minimally responsive 06/18 severe ARDS pattern on x-ray requiring high FiO2/PEEP. Remains on CRRT. On maximum dose vasopressors. Remains minimally responsive but with some spontaneous ventilatory effort 06/18 mother and father updated at bedside. Goals of care again addressed. They  were informed that he is on maximum support. Made DNR in event of cardiac arrest. To consider discontinuation of life-sustaining therapies later in the week 06/19 Tachyarrhythmias. Amiodarone increased. Milrinone stopped. Remains on maximum support with vasopressors, mech vent and CRRT 06/20 No major change overall. Remains on maximum vent support, high dose vasopressors, CRRT. Remains minimally responsive. Vasopressin discontinued. Amiodarone changed to enteral route 06/21 no major changes. No sign of meaningful improvement   INDWELLING DEVICES:: ETT 06/11 >>  L IJ CVL 06/11 >> 06/15 R IJ HD cath 06/13 >>  RUE PICC 06/15 >>    MICRO DATA: MRSA PCR 06/11 >> NEG Resp 06/11 >> consistent with oral flora Blood 06/11 >> NEG Blood 06/18 >> NEG Resp 06/18 >> resp flora  ANTIMICROBIALS:  Ceftriaxone 06/11 X 1 dose  SUBJECTIVE:  RASS -4, -5. Remains on fentanyl to maintain patient-ventilator synchrony. Remains minimally responsive to painful stimuli.  VITAL SIGNS: BP 97/78   Pulse 97   Temp 98.1 F (36.7 C)   Resp 18   Ht 6' (1.829 m)   Wt (!) 196 kg (432 lb)   SpO2 94%   BMI 58.59 kg/m   HEMODYNAMICS:    VENTILATOR SETTINGS: Vent Mode: PRVC FiO2 (%):  [70 %-100 %] 100 % Set Rate:  [24 bmp] 24 bmp Vt Set:  [550 mL] 550 mL PEEP:  [15 cmH20] 15 cmH20 Plateau Pressure:  [32 cmH20] 32 cmH20  INTAKE / OUTPUT: I/O last 3 completed shifts: In: 7624.3 [I.V.:7254.1; NG/GT:320.2; IV Piggyback:50] Out: 3964 [Urine:133; Emesis/NG output:350; Other:3481]  PHYSICAL EXAMINATION: General: Morbidly obese, fentanyl infusion, RASS -4, -5  Neuro: Spontaneous ventilatory effort, minimal spontaneous movement, occasional myoclonic-like movement HEENT: NCAT, severe scleral edema Cardiovascular: Tachy, IR IR, no murmurs noted Lungs: coarse bilateral breath sounds without wheezes Abdomen: Severely obese, bowel sounds not audible Ext: cool, diminished to absent pulses, symmetric edema in  all extremities  LABS:  BMET  Recent Labs Lab 09/17/16 1217 09/17/16 2101 09/18/16 0505  NA 137 135 136  K 3.9 3.7 3.9  CL 106 106 105  CO2 27 24 25   BUN 30* 35* 33*  CREATININE 1.99* 2.16* 2.14*  GLUCOSE 93 101* 127*    Electrolytes  Recent Labs Lab 09/17/16 1217 09/17/16 2101 09/18/16 0505  CALCIUM 8.7* 8.2* 8.4*  MG 1.8 1.8 1.8  PHOS 2.3* 2.2* 2.4*    CBC  Recent Labs Lab 09/16/16 0419 09/17/16 0443 09/18/16 0505  WBC 16.0* 17.0* 22.5*  HGB 10.0* 9.9* 9.7*  HCT 30.9* 30.5* 29.7*  PLT 106* 102* 115*    Coag's No results for input(s): APTT, INR in the last 168 hours.  Sepsis Markers No results for input(s): LATICACIDVEN, PROCALCITON, O2SATVEN in the last 168 hours.  ABG  Recent Labs Lab 09/13/16 0500 09/13/16 0650 09/14/16 0400  PHART 7.29* 7.22* 7.25*  PCO2ART 57* 63* 61*  PO2ART 52* UNABLE TO CALCULATE. 61*    Liver Enzymes  Recent Labs Lab 09/14/16 0513  09/16/16 0419  09/17/16 0443 09/17/16 1217 09/17/16 2101 09/18/16 0505  AST 29  --  26  --  30  --   --   --   ALT 10*  --  11*  --  13*  --   --   --   ALKPHOS 73  --  69  --  69  --   --   --   BILITOT 1.9*  --  2.7*  --  3.2*  --   --   --   ALBUMIN 2.0*  < > 2.3*  2.4*  < > 2.4*  2.5* 2.4* 2.2* 2.4*  < > = values in this interval not displayed.  Cardiac Enzymes  Recent Labs Lab 09/13/16 0811 09/13/16 1237 09/13/16 1815  TROPONINI 0.05* 0.06* 0.05*    Glucose  Recent Labs Lab 09/17/16 0715 09/17/16 1146 09/17/16 1633 09/17/16 1930 09/18/16 0019 09/18/16 0405  GLUCAP 86 83 86 105* 101* 105*    CXR: NNF   ASSESSMENT / PLAN:  PULMONARY A: Acute hypoxemic respiratory failure VDRF post cardiac arrest Severe pulmonary edema +/- ARDS  P:   Cont full vent support - settings reviewed and/or adjusted Patient not amenable secondary to mental status at this time  CARDIOVASCULAR A:  S/P out of hospital cardiac arrest 06/11 Severe cardiomyopathy (LVEF  10%) Chronic atrial fibrillation Persistent cardiogenic shock P:  Continue vasopressors for MAP goal > 65 mmHg Amiodarone changed to enteral 06/20 DNR in event of cardiac arrest  RENAL A:   Oligo-anuric AKI post cardiac arrest Hypervolemia P:   Monitor BMET intermittently Monitor I/Os Correct electrolytes as indicated Continue CRRT per Renal Service  GASTROINTESTINAL A:   Morbid obesity Gastric ileus P:   SUP: Enteral PPI TF protocol - trickle  HEMATOLOGIC A:   Mild ICU acquired anemia without acute blood loss Mild thrombocytopenia P:  DVT px: SQ heparin Monitor CBC intermittently Transfuse per usual guidelines   INFECTIOUS A:   No definite infection identified P:   White count slowly increasing now 22.5 Will resend blood cultures and start vancomycin and Zosyn  ENDOCRINE A:   Type II DM, controlled P:   Continue Lantus - dose reduced 06/21 Continue SSI  NEUROLOGIC A:   Severe anoxic encephalopathy Cerebral edema  Very poor neurological prognosis P:   RASS goal: -1, -2 Continue fentanyl infusion for ventilator synchrony Palliative Care working with family  Likely  terminal extubation 06/23   FAMILY: Father updated   CCM time: 40 mins The above time includes time spent in consultation with patient and/or family members and reviewing care plan on multidisciplinary rounds  Tora KindredJohn Coron Rossano, DO PCCM service Mobile [phone] Pager [phone]  09/18/2016, 7:26 AM

## 2016-09-18 NOTE — Progress Notes (Signed)
[facility]  ROUNDING NOTE   Subjective:   Patient remains critically ill,intubated and sedated Requiring high dose of pressors-   Tolerated CRRT UF 150 cc / hr  UOP poor cc  Remains ventilator dependent Requiring increasing pressor support Ultrafiltration of 2000 cc last 24 hours    Objective:  Vital signs in last 24 hours:  Temp:  [98.1 F (36.7 C)-99.3 F (37.4 C)] 99.1 F (37.3 C) (06/22 1500) Pulse Rate:  [77-134] 114 (06/22 1500) Resp:  [13-32] 13 (06/22 1500) BP: (64-121)/(43-95) 89/68 (06/22 1500) SpO2:  [89 %-100 %] 96 % (06/22 1500) FiO2 (%):  [70 %-100 %] 100 % (06/22 1336) Weight:  [196 kg (432 lb)] 196 kg (432 lb) (06/22 0500)  Weight change: -26.309 kg (-58 lb) Filed Weights   09/16/16 0500 09/17/16 0700 09/18/16 0500  Weight: (!) 220.9 kg (487 lb) (!) 222.3 kg (490 lb) (!) 196 kg (432 lb)    Intake/Output: I/O last 3 completed shifts: In: 7624.3 [I.V.:7254.1; NG/GT:320.2; IV Piggyback:50] Out: 4106 [Urine:133; Emesis/NG output:350; Other:3623]   Intake/Output this shift:  Total I/O In: 2382.3 [I.V.:1970.3; NG/GT:62; IV Piggyback:350] Out: 670 [Urine:10; Other:660]  Physical Exam: General: Critically ill   H, ENT ETT, OGT  Eyes: Eyes closed. Conjunctival edema with conjunctival hemorrhage  Neck: No masses  Lungs:  Diminished, vent assisted  Heart: Irregular, tachycardia  Abdomen:  Soft,  obese  Extremities: + dependent  peripheral edema; anasarca  Neurologic: Intubated, sedated  Skin: No acute rashes  Access: RIJ temp HD catheter 6/13 Dr. Ashby Dawes    Basic Metabolic Panel:  Recent Labs Lab 09/17/16 0443 09/17/16 1217 09/17/16 2101 09/18/16 0505 09/18/16 1304  NA 137  138 137 135 136 135  K 4.0  4.0 3.9 3.7 3.9 3.9  CL 106  106 106 106 105 104  CO2 27  27 27 24 25 25   GLUCOSE 99  98 93 101* 127* 130*  BUN 28*  31* 30* 35* 33* 36*  CREATININE 1.92*  2.00* 1.99* 2.16* 2.14* 2.29*  CALCIUM 8.4*  8.3* 8.7*  8.2* 8.4* 8.5*  MG 1.9 1.8 1.8 1.8 1.8  PHOS 2.4* 2.3* 2.2* 2.4* 2.3*    Liver Function Tests:  Recent Labs Lab 09/12/16 0359  09/14/16 0513  09/16/16 0419  09/17/16 0443 09/17/16 1217 09/17/16 2101 09/18/16 0505 09/18/16 1304  AST 28  --  29  --  26  --  30  --   --   --   --   ALT 15*  --  10*  --  11*  --  13*  --   --   --   --   ALKPHOS 53  --  73  --  69  --  69  --   --   --   --   BILITOT 0.8  --  1.9*  --  2.7*  --  3.2*  --   --   --   --   PROT 5.2*  --  6.2*  --  6.3*  --  6.4*  --   --   --   --   ALBUMIN 2.0*  < > 2.0*  < > 2.3*  2.4*  < > 2.4*  2.5* 2.4* 2.2* 2.4* 2.3*  < > = values in this interval not displayed. No results for input(s): LIPASE, AMYLASE in the last 168 hours. No results for input(s): AMMONIA in the last 168 hours.  CBC:  Recent Labs Lab 09/14/16 0513 09/15/16  2951 09/16/16 0419 09/17/16 0443 09/18/16 0505  WBC 17.6* 14.8* 16.0* 17.0* 22.5*  HGB 10.4* 10.0* 10.0* 9.9* 9.7*  HCT 32.5* 30.8* 30.9* 30.5* 29.7*  MCV 79.4* 80.8 79.0* 79.9* 81.6  PLT 139* 110* 106* 102* 115*    Cardiac Enzymes:  Recent Labs Lab 09/13/16 0811 09/13/16 1237 09/13/16 1815  TROPONINI 0.05* 0.06* 0.05*    BNP: Invalid input(s): POCBNP  CBG:  Recent Labs Lab 09/17/16 1930 09/18/16 0019 09/18/16 0405 09/18/16 0727 09/18/16 1134  GLUCAP 105* 101* 105* 116* 131*    Microbiology: Results for orders placed or performed during the hospital encounter of 09/15/2016  Culture, blood (routine x 2)     Status: None   Collection Time: 08/28/2016  8:18 AM  Result Value Ref Range Status   Specimen Description BLOOD L AC  Final   Special Requests   Final    BOTTLES DRAWN AEROBIC AND ANAEROBIC Blood Culture adequate volume   Culture NO GROWTH 9 DAYS  Final   Report Status 09/16/2016 FINAL  Final  MRSA PCR Screening     Status: None   Collection Time: 09/04/2016  9:46 AM  Result Value Ref Range Status   MRSA by PCR NEGATIVE NEGATIVE Final    Comment:         The GeneXpert MRSA Assay (FDA approved for NASAL specimens only), is one component of a comprehensive MRSA colonization surveillance program. It is not intended to diagnose MRSA infection nor to guide or monitor treatment for MRSA infections.   Culture, blood (routine x 2)     Status: None   Collection Time: 08/30/2016 11:45 AM  Result Value Ref Range Status   Specimen Description BLOOD L HAND  Final   Special Requests   Final    BOTTLES DRAWN AEROBIC ONLY Blood Culture results may not be optimal due to an inadequate volume of blood received in culture bottles   Culture NO GROWTH 9 DAYS  Final   Report Status 09/16/2016 FINAL  Final  Culture, respiratory (NON-Expectorated)     Status: None   Collection Time: 09/17/2016  6:32 PM  Result Value Ref Range Status   Specimen Description TRACHEAL ASPIRATE  Final   Special Requests NONE  Final   Gram Stain   Final    FEW WBC PRESENT, PREDOMINANTLY PMN MODERATE GRAM POSITIVE COCCI IN CHAINS IN PAIRS MODERATE GRAM NEGATIVE RODS    Culture   Final    Consistent with normal respiratory flora. Performed at [facility], [place] [address] [place], [place] [address]    Report Status 09/10/2016 FINAL  Final  Culture, respiratory (NON-Expectorated)     Status: None   Collection Time: 09/14/16 12:21 AM  Result Value Ref Range Status   Specimen Description TRACHEAL ASPIRATE  Final   Special Requests NONE  Final   Gram Stain   Final    ABUNDANT WBC PRESENT, PREDOMINANTLY PMN NO SQUAMOUS EPITHELIAL CELLS SEEN NO ORGANISMS SEEN    Culture   Final    NO GROWTH Performed at [facility], [place] [address] [place], [place] [address]    Report Status 09/16/2016 FINAL  Final    Coagulation Studies:  Recent Labs  09/18/16 1304  LABPROT 14.8  INR 1.15    Urinalysis: No results for input(s): COLORURINE, LABSPEC, PHURINE, GLUCOSEU, HGBUR, BILIRUBINUR, KETONESUR, PROTEINUR, UROBILINOGEN, NITRITE, LEUKOCYTESUR in the last 72  hours.  Invalid input(s): APPERANCEUR    Imaging: Dg Chest Port 1 View  Result Date:  09/18/2016 CLINICAL DATA:  Respiratory failure. EXAM: PORTABLE CHEST 1 VIEW COMPARISON:  09/17/2016 . FINDINGS: Endotracheal tube, NG tube, right IJ sheath, left PICC line in stable position. Cardiomegaly with diffuse dense bilateral pulmonary alveolar infiltrates/edema. No interim change. Small pleural effusions cannot be excluded. No pneumothorax . IMPRESSION: 1. Lines and tubes in stable position. 2. Cardiomegaly with diffuse dense bilateral pulmonary alveolar infiltrates/edema. No interim change. Small bilateral pleural effusions cannot be excluded . Electronically Signed   By: Marcello Moores  Register   On: 09/18/2016 06:21   Dg Chest Port 1 View  Result Date: 09/17/2016 CLINICAL DATA:  Central line placement. EXAM: PORTABLE CHEST 1 VIEW COMPARISON:  Chest radiograph September 16, 2016 FINDINGS: Habitus limited examination. Endotracheal tube tip projects approximately 5 cm above the carina. Dialysis catheter via RIGHT internal jugular venous approach projecting at proximal atrium. LEFT PICC distal tip at approximate brachiocephalic confluence. Nasogastric tube in place, and not well visualized distal to the mid esophageal region. Cardiomegaly and pulmonary vascular congestion, interstitial prominence and slightly decreased alveolar airspace opacities. IMPRESSION: Habitus limited examination. No apparent change in life-support lines. Cardiomegaly and slightly decreased pulmonary edema. Electronically Signed   By: Elon Alas M.D.   On: 09/17/2016 20:25     Medications:   . sodium chloride Stopped (09/17/16 0800)  . albumin human    . dextrose 30 mL/hr at 09/18/16 1500  . feeding supplement (VITAL HIGH PROTEIN) Stopped (09/18/16 0700)  . fentaNYL infusion INTRAVENOUS 400 mcg/hr (09/18/16 1515)  . heparin 1,000 Units/hr (09/18/16 1500)  . norepinephrine (LEVOPHED) Adult infusion 10.987 mcg/min (09/18/16 1500)  .  phenylephrine (NEO-SYNEPHRINE) Adult infusion 360 mcg/min (09/18/16 1500)  . piperacillin-tazobactam (ZOSYN)  IV 3.375 g (09/18/16 1356)  . pureflow 2,000 mL/hr at 09/18/16 1151  . [START ON 10-07-16] vancomycin     . amiodarone  400 mg Per Tube BID  . chlorhexidine gluconate (MEDLINE KIT)  15 mL Mouth Rinse BID  . feeding supplement (PRO-STAT SUGAR FREE 64)  30 mL Per Tube Q4H  . insulin aspart  0-15 Units Subcutaneous Q4H  . insulin glargine  10 Units Subcutaneous Daily  . ipratropium-albuterol  3 mL Nebulization Q6H  . mouth rinse  15 mL Mouth Rinse 10 times per day  . pantoprazole sodium  40 mg Per Tube Q1200  . sennosides  5 mL Per Tube BID  . sodium chloride flush  10-40 mL Intracatheter Q12H  . sodium chloride flush  10-40 mL Intracatheter Q12H   acetaminophen **OR** acetaminophen, artificial tears, fentaNYL, heparin, LORazepam, polyvinyl alcohol, sodium chloride flush, sodium chloride flush  Assessment/ Plan:  Mr. MUNEER LEIDER is a 51 y.o. black male with diabetes mellitus type II, hyeprtension, congestive heart failure, atrial fibrillation,  who was admitted to [facility] on 09/03/2016 for Cardiac arrest ([place]) [I46.9]  1. Acute renal failure: on chronic kidney disease stage III baseline creatinine of 1.4 04/10/16.  Nonoliguric. iv contrast exposure for CT angiogram at admission On CRRT 4K bath.  continued UF at 150 mL/hr  - continue iv Albumin - Monitor renal function, volume status, urine output and electrolytes.    2. Cardiogenic shock with hypotension, with anasarca: on vasopressors:   Supportive care continue iv albumin  3. Acute Respiratory Failure:  Requiring Vent support Broad spectrum Abx as per CCU/IM team  4.  Anoxic brain injury - as noted on CT head      LOS: 11 Kinzly Pierrelouis 6/22/20183:53 PM

## 2016-09-18 NOTE — Progress Notes (Addendum)
CRRT restarted at 11:30, cautions on screen but is running at this time w/o alarms, vas cath blood return was improved after 1 hour cath flo dwell.

## 2016-09-18 NOTE — Progress Notes (Signed)
Pharmacy Antibiotic Note  Isaiah [name] is a 51 y.o. male admitted on 08/29/2016 s/p cardiac arrest. Patient is s/p code ICE.  Pharmacy has been consulted for empiric vancomycin and Zosyn dosing. Patient is currently on CRRT.  Plan: Zosyn 3.375 g EI q 8 hours.   Vancomycin 1750 mg iv once (1000 mg + 750 mg) then 1250 mg iv q 24 hours. Will check a level with the third dose for a goal trough of 15-20 mcg/ml.   Height: 6' (182.9 cm) Weight: (!) 432 lb (196 kg) IBW/kg (Calculated) : 77.6  Temp (24hrs), Avg:98.6 F (37 C), Min:98.1 F (36.7 C), Max:99.3 F (37.4 C)   Recent Labs Lab 09/14/16 0513  09/15/16 0420  09/16/16 0419  09/17/16 0443 09/17/16 1217 09/17/16 2101 09/18/16 0505 09/18/16 1304  WBC 17.6*  --  14.8*  --  16.0*  --  17.0*  --   --  22.5*  --   CREATININE 2.28*  < > 1.96*  1.94*  < > 1.81*  2.00*  < > 1.92*  2.00* 1.99* 2.16* 2.14* 2.29*  < > = values in this interval not displayed.  Estimated Creatinine Clearance: 69.7 mL/min (A) (by C-G formula based on SCr of 2.29 mg/dL (H)).    No Known Allergies   Antimicrobials this admission: vancomycin 6/22 >>  Zosyn 6/22 >>   Dose adjustments this admission:   Microbiology results: 6/11 BCx: NG 6/11 TA: normal flora  6/11 MRSA PCR: negative 6/18 TA: NG 6/22 BCx: sent  Thank you for allowing pharmacy to be a part of this patient's care.  Isaiah HartChristy, Isaiah [name] D 09/18/2016 2:57 PM

## 2016-09-18 NOTE — Progress Notes (Signed)
CRRT to run at 150 UF rate

## 2016-09-18 NOTE — Progress Notes (Signed)
Pharmacy Consult for Heparin Indication: atrial fibrillation/ dialysis catheter clotting  No Known Allergies  Patient Measurements: Height: 6' (182.9 cm) Weight: (!) 432 lb (196 kg) IBW/kg (Calculated) : 77.6 Heparin Dosing Weight: 134.6 kg  Vital Signs: Temp: 99 F (37.2 C) (06/22 1300) Temp Source: Core (Comment) (06/22 1200) BP: 92/68 (06/22 1300) Pulse Rate: 90 (06/22 1300)  Labs:  Recent Labs  09/16/16 0419  09/17/16 0443  09/17/16 2101 09/18/16 0505 09/18/16 1304  HGB 10.0*  --  9.9*  --   --  9.7*  --   HCT 30.9*  --  30.5*  --   --  29.7*  --   PLT 106*  --  102*  --   --  115*  --   APTT  --   --   --   --   --   --  42*  LABPROT  --   --   --   --   --   --  14.8  INR  --   --   --   --   --   --  1.15  CREATININE 1.81*  2.00*  < > 1.92*  2.00*  < > 2.16* 2.14* 2.29*  < > = values in this interval not displayed.  Estimated Creatinine Clearance: 69.7 mL/min (A) (by C-G formula based on SCr of 2.29 mg/dL (H)).   Medical History: Past Medical History:  Diagnosis Date  . CHF (congestive heart failure) (HCC)   . Diabetes mellitus without complication (HCC)   . Hypertension    Assessment: 51 y/o M s/p cardiac arrest and code ICE with a h/o atrial fibrillation and issues with dialysis catheter clotting. Will initiate low dose heparin drip with lowered anti-Xa goal per nephrology.   Goal of Therapy:  Heparin Level 0.3-0.5 Monitor platelets by anticoagulation protocol: Yes   Plan:  Will initiate heparin without bolus at 1000 units/hr as discussed with nephrology.  Start heparin infusion at 1000 units/hr Check anti-Xa level in 6 hours and daily while on heparin Continue to monitor H&H and platelets  Luisa HartChristy, Christan Ciccarelli D 09/18/2016,2:36 PM

## 2016-09-18 NOTE — Progress Notes (Signed)
Reviewed chart.  Discussed case with Neurology who feels the patient has a very grim prognosis.  Patient was a Optician, dispensingminister. Examined patient and spoke with bedside RN.  On CRRT, 2 pressors, WBC is 22000, antibiotics started today.  Well developed morbidly obese male non-responsive to my voice or light touch Eyes open but not seeing, bloodied sclera CV rrr Resp on vent, no distress Abdomen morbidly obese  Spoke briefly with mother, father and friend to offer support and answer questions.  His mother states that they are praying for total healing. Per previous PMT notes the plan is to monitor until Sunday when the decision will be made to withdraw care if no improvement.  Algis DownsMarianne York, PA-C Palliative Medicine Pager: [phone]   Total time 15 min.

## 2016-09-18 NOTE — Progress Notes (Addendum)
Pharmacy Consult for Heparin Indication: atrial fibrillation/ dialysis catheter clotting  No Known Allergies  Patient Measurements: Height: 6' (182.9 cm) Weight: (!) 432 lb (196 kg) IBW/kg (Calculated) : 77.6 Heparin Dosing Weight: 134.6 kg  Vital Signs: Temp: 99.3 F (37.4 C) (06/22 1900) Temp Source: Core (Comment) (06/22 1600) BP: 80/56 (06/22 1900) Pulse Rate: 121 (06/22 1900)  Labs:  Recent Labs  09/16/16 0419  09/17/16 0443  09/17/16 2101 09/18/16 0505 09/18/16 1304 09/18/16 1759  HGB 10.0*  --  9.9*  --   --  9.7*  --   --   HCT 30.9*  --  30.5*  --   --  29.7*  --   --   PLT 106*  --  102*  --   --  115*  --   --   APTT  --   --   --   --   --   --  42*  --   LABPROT  --   --   --   --   --   --  14.8  --   INR  --   --   --   --   --   --  1.15  --   HEPARINUNFRC  --   --   --   --   --   --   --  0.10*  CREATININE 1.81*  2.00*  < > 1.92*  2.00*  < > 2.16* 2.14* 2.29*  --   < > = values in this interval not displayed.  Estimated Creatinine Clearance: 69.7 mL/min (A) (by C-G formula based on SCr of 2.29 mg/dL (H)).   Medical History: Past Medical History:  Diagnosis Date  . CHF (congestive heart failure) (HCC)   . Diabetes mellitus without complication (HCC)   . Hypertension    Assessment: 51 y/o M s/p cardiac arrest and code ICE with a h/o atrial fibrillation and issues with dialysis catheter clotting. Will initiate low dose heparin drip with lowered anti-Xa goal per nephrology.   Goal of Therapy:  Heparin Level 0.3-0.5 Monitor platelets by anticoagulation protocol: Yes   Plan:  Will initiate heparin without bolus at 1000 units/hr as discussed with nephrology.  Start heparin infusion at 1000 units/hr Check anti-Xa level in 6 hours and daily while on heparin Continue to monitor H&H and platelets   6/22:  HL @ 18:00 = 0.1 Will order Heparin 4000 units IV X 1 bolus and increase drip rate to 1400 units/hr. Will recheck HL 6 hrs after rate  change.  Treysen Sudbeck D 09/18/2016,7:40 PM

## 2016-09-18 NOTE — Progress Notes (Signed)
Per Dr Lonn Georgiaonforti it is ok to use pt's CRRT line for labs, including second set blood cultures

## 2016-09-19 ENCOUNTER — Inpatient Hospital Stay: Payer: BLUE CROSS/BLUE SHIELD

## 2016-09-19 LAB — CBC
HCT: 28 % — ABNORMAL LOW (ref 40.0–52.0)
HEMOGLOBIN: 8.9 g/dL — AB (ref 13.0–18.0)
MCH: 26.1 pg (ref 26.0–34.0)
MCHC: 31.9 g/dL — AB (ref 32.0–36.0)
MCV: 81.8 fL (ref 80.0–100.0)
Platelets: 138 10*3/uL — ABNORMAL LOW (ref 150–440)
RBC: 3.42 MIL/uL — AB (ref 4.40–5.90)
RDW: 19.4 % — ABNORMAL HIGH (ref 11.5–14.5)
WBC: 42.8 10*3/uL — ABNORMAL HIGH (ref 3.8–10.6)

## 2016-09-19 LAB — BLOOD GAS, ARTERIAL
Acid-base deficit: 12 mmol/L — ABNORMAL HIGH (ref 0.0–2.0)
Acid-base deficit: 3.1 mmol/L — ABNORMAL HIGH (ref 0.0–2.0)
Bicarbonate: 18.7 mmol/L — ABNORMAL LOW (ref 20.0–28.0)
Bicarbonate: 24.7 mmol/L (ref 20.0–28.0)
FIO2: 1
FIO2: 1
MECHANICAL RATE: 20
O2 Saturation: 46.4 %
O2 Saturation: 89.3 %
PATIENT TEMPERATURE: 37
PEEP: 15 cmH2O
PEEP: 15 cmH2O
PO2 ART: 37 mmHg — AB (ref 83.0–108.0)
Patient temperature: 37
RATE: 20 resp/min
VT: 550 mL
VT: 550 mL
pCO2 arterial: 59 mmHg — ABNORMAL HIGH (ref 32.0–48.0)
pCO2 arterial: 63 mmHg — ABNORMAL HIGH (ref 32.0–48.0)
pH, Arterial: 7.08 — CL (ref 7.350–7.450)
pH, Arterial: 7.23 — ABNORMAL LOW (ref 7.350–7.450)
pO2, Arterial: 68 mmHg — ABNORMAL LOW (ref 83.0–108.0)

## 2016-09-19 LAB — MAGNESIUM: Magnesium: 1.8 mg/dL (ref 1.7–2.4)

## 2016-09-19 LAB — GLUCOSE, CAPILLARY
Glucose-Capillary: 123 mg/dL — ABNORMAL HIGH (ref 65–99)
Glucose-Capillary: 127 mg/dL — ABNORMAL HIGH (ref 65–99)
Glucose-Capillary: 132 mg/dL — ABNORMAL HIGH (ref 65–99)
Glucose-Capillary: 146 mg/dL — ABNORMAL HIGH (ref 65–99)
Glucose-Capillary: 79 mg/dL (ref 65–99)

## 2016-09-19 LAB — RENAL FUNCTION PANEL
ALBUMIN: 2.3 g/dL — AB (ref 3.5–5.0)
Anion gap: 9 (ref 5–15)
BUN: 35 mg/dL — ABNORMAL HIGH (ref 6–20)
CALCIUM: 8.2 mg/dL — AB (ref 8.9–10.3)
CO2: 27 mmol/L (ref 22–32)
Chloride: 104 mmol/L (ref 101–111)
Creatinine, Ser: 2.59 mg/dL — ABNORMAL HIGH (ref 0.61–1.24)
GFR calc non Af Amer: 28 mL/min — ABNORMAL LOW (ref >60)
GFR, EST AFRICAN AMERICAN: 32 mL/min — AB (ref >60)
Glucose, Bld: 145 mg/dL — ABNORMAL HIGH (ref 65–99)
PHOSPHORUS: 3.3 mg/dL (ref 2.5–4.6)
Potassium: 4.1 mmol/L (ref 3.5–5.1)
SODIUM: 140 mmol/L (ref 135–145)

## 2016-09-19 LAB — HEPARIN LEVEL (UNFRACTIONATED)
HEPARIN UNFRACTIONATED: 0.33 [IU]/mL (ref 0.30–0.70)
Heparin Unfractionated: 0.21 IU/mL — ABNORMAL LOW (ref 0.30–0.70)

## 2016-09-19 MED ORDER — VECURONIUM BROMIDE 10 MG IV SOLR
10.0000 mg | INTRAVENOUS | Status: DC | PRN
  Administered 2016-09-19: 10 mg via INTRAVENOUS
  Filled 2016-09-19: qty 10

## 2016-09-19 MED ORDER — ALTEPLASE 2 MG IJ SOLR
2.0000 mg | Freq: Once | INTRAMUSCULAR | Status: DC | PRN
  Administered 2016-09-19: 2 mg

## 2016-09-19 MED ORDER — AMIODARONE HCL IN DEXTROSE 360-4.14 MG/200ML-% IV SOLN
60.0000 mg/h | INTRAVENOUS | Status: AC
  Administered 2016-09-19: 60 mg/h via INTRAVENOUS

## 2016-09-19 MED ORDER — STERILE WATER FOR INJECTION IJ SOLN
INTRAMUSCULAR | Status: AC
  Administered 2016-09-19: 10 mL
  Filled 2016-09-19: qty 10

## 2016-09-19 MED ORDER — AMIODARONE HCL IN DEXTROSE 360-4.14 MG/200ML-% IV SOLN
INTRAVENOUS | Status: AC
  Administered 2016-09-19: 150 mg via INTRAVENOUS
  Filled 2016-09-19: qty 200

## 2016-09-19 MED ORDER — SODIUM BICARBONATE 8.4 % IV SOLN
INTRAVENOUS | Status: DC
  Administered 2016-09-19: 01:00:00 via INTRAVENOUS
  Filled 2016-09-19 (×2): qty 150

## 2016-09-19 MED ORDER — SODIUM BICARBONATE 8.4 % IV SOLN
INTRAVENOUS | Status: AC
  Administered 2016-09-19: 200 meq via INTRAVENOUS
  Filled 2016-09-19: qty 50

## 2016-09-19 MED ORDER — VASOPRESSIN 20 UNIT/ML IV SOLN
0.0300 [IU]/min | INTRAVENOUS | Status: DC
  Administered 2016-09-19: 0.03 [IU]/min via INTRAVENOUS
  Filled 2016-09-19 (×2): qty 2

## 2016-09-19 MED ORDER — AMIODARONE HCL IN DEXTROSE 360-4.14 MG/200ML-% IV SOLN: 30.0000 mg/h | INTRAVENOUS | Status: DC

## 2016-09-19 MED ORDER — SODIUM BICARBONATE 8.4 % IV SOLN
200.0000 meq | Freq: Once | INTRAVENOUS | Status: AC
  Administered 2016-09-19: 200 meq via INTRAVENOUS
  Filled 2016-09-19: qty 150

## 2016-09-19 MED ORDER — AMIODARONE IV BOLUS ONLY 150 MG/100ML
150.0000 mg | Freq: Once | INTRAVENOUS | Status: DC
  Filled 2016-09-19: qty 100

## 2016-09-19 MED ORDER — AMIODARONE LOAD VIA INFUSION
150.0000 mg | Freq: Once | INTRAVENOUS | Status: AC
  Administered 2016-09-19: 150 mg via INTRAVENOUS
  Filled 2016-09-19: qty 83.34

## 2016-09-19 MED ORDER — MIDAZOLAM HCL 2 MG/2ML IJ SOLN
2.0000 mg | INTRAMUSCULAR | Status: DC | PRN
  Administered 2016-09-19: 2 mg via INTRAVENOUS
  Administered 2016-09-19: 4 mg via INTRAVENOUS
  Filled 2016-09-19: qty 2
  Filled 2016-09-19: qty 4

## 2016-09-19 MED ORDER — SODIUM BICARBONATE 8.4 % IV SOLN
200.0000 meq | Freq: Once | INTRAVENOUS | Status: AC
  Administered 2016-09-19: 200 meq via INTRAVENOUS
  Filled 2016-09-19: qty 200

## 2016-09-19 MED ORDER — AMIODARONE HCL IN DEXTROSE 360-4.14 MG/200ML-% IV SOLN
INTRAVENOUS | Status: AC
  Administered 2016-09-19: 60 mg/h
  Filled 2016-09-19: qty 200

## 2016-09-19 MED ORDER — AMIODARONE HCL IN DEXTROSE 360-4.14 MG/200ML-% IV SOLN
60.0000 mg/h | INTRAVENOUS | Status: AC
  Administered 2016-09-19 (×2): 60 mg/h via INTRAVENOUS
  Filled 2016-09-19: qty 200

## 2016-09-19 MED ORDER — SODIUM BICARBONATE 8.4 % IV SOLN: 200.0000 meq | Freq: Once | INTRAVENOUS | Status: DC

## 2016-09-20 LAB — CULTURE, RESPIRATORY W GRAM STAIN: Culture: NORMAL

## 2016-09-20 LAB — CULTURE, RESPIRATORY: GRAM STAIN: NONE SEEN

## 2016-09-21 ENCOUNTER — Telehealth: Payer: Self-pay

## 2016-09-21 LAB — BLOOD GAS, ARTERIAL
Acid-base deficit: 3.8 mmol/L — ABNORMAL HIGH (ref 0.0–2.0)
BICARBONATE: 24.6 mmol/L (ref 20.0–28.0)
FIO2: 1
Mechanical Rate: 20
O2 SAT: 67.6 %
PEEP: 15 cmH2O
Patient temperature: 37
VT: 550 mL
pCO2 arterial: 63 mmHg — ABNORMAL HIGH (ref 32.0–48.0)
pH, Arterial: 7.2 — ABNORMAL LOW (ref 7.350–7.450)
pO2, Arterial: 44 mmHg — ABNORMAL LOW (ref 83.0–108.0)

## 2016-09-21 NOTE — Telephone Encounter (Signed)
Death certificate placed in nurse box.

## 2016-09-21 NOTE — Telephone Encounter (Signed)
Death certificate given to DR for completion.

## 2016-09-21 NOTE — Telephone Encounter (Signed)
Notified [facility] home death certificate ready for p/u.

## 2016-09-23 LAB — CULTURE, BLOOD (ROUTINE X 2)
Culture: NO GROWTH
Culture: NO GROWTH
SPECIAL REQUESTS: ADEQUATE
Special Requests: ADEQUATE

## 2016-09-27 NOTE — Progress Notes (Signed)
Pharmacy Antibiotic Note  Isaiah [name] is a 51 y.o. male admitted on 09/15/2016 s/p cardiac arrest. Patient is s/p code ICE.  Pharmacy has been consulted for empiric vancomycin and Zosyn dosing. Patient is currently on CRRT.  Plan: Continue Zosyn 3.375 g EI q 8 hours.   Vancomycin 1750 mg iv once (1000 mg + 750 mg) then 1250 mg iv q 24 hours. Will check a level with the third dose for a goal trough of 15-20 mcg/ml.   Height: 6' (182.9 cm) Weight: (!) 494 lb (224.1 kg) IBW/kg (Calculated) : 77.6  Temp (24hrs), Avg:99.8 F (37.7 C), Min:98.8 F (37.1 C), Max:100.4 F (38 C)   Recent Labs Lab 09/15/16 0420  09/16/16 0419  09/17/16 0443  09/17/16 2101 09/18/16 0505 09/18/16 1304 09/18/16 2117 2017/02/27 0425  WBC 14.8*  --  16.0*  --  17.0*  --   --  22.5*  --   --  42.8*  CREATININE 1.96*  1.94*  < > 1.81*  2.00*  < > 1.92*  2.00*  < > 2.16* 2.14* 2.29* 2.61* 2.59*  < > = values in this interval not displayed.  Estimated Creatinine Clearance: 67.2 mL/min (A) (by C-G formula based on SCr of 2.59 mg/dL (H)).    No Known Allergies   Antimicrobials this admission: vancomycin 6/22 >>  Zosyn 6/22 >>   Dose adjustments this admission:   Microbiology results: 6/11 BCx: NG 6/11 TA: normal flora  6/11 MRSA PCR: negative 6/18 TA: NG 6/22 BCx: NGTD 6/22 TA: pending  Thank you for allowing pharmacy to be a part of this patient's care.  Luisa HartChristy, Yaslin Kirtley D 2017-02-21 10:24 AM

## 2016-09-27 NOTE — Progress Notes (Signed)
Pulmonary / Critical Care  Family discussion. B/P refractory in the 60's Unable to do CRRT Access clotting off. Discussed with family and reviewed MODS, refractory shock, probable anoxic brain injury, ARDS, Renal failure. Family does not feel comfortable extubating but agree to stop pressors and not to restart.  Will wait until pastor arrives Patient is on appropriate sedation and pain relief Nurse present  Jonny RuizJohn Dahmir Epperly,DO

## 2016-09-27 NOTE — Progress Notes (Signed)
PULMONARY / CRITICAL CARE MEDICINE   Name: Isaiah [name] MRN: [id number] DOB: 27-May-1965    ADMISSION DATE:  08/28/2016  PT PROFILE: 51 yo male with DM, CAF, morbid obesity, cardiomyopathy suffered out of hospital cardiac arrest requiring prolonged CPR/ACLS. Underwent hypothermia protocol. Hospitalization complicated by MODS.  MAJOR EVENTS/TEST RESULTS: 06/11 Admission after out of hospital cardiac arrest 06/11 CTA chest: No PE noted on suboptimal study 06/11 CT head: No acute abnormalities 06/11 EEG: abnormal EEG due to a burst suppression rhythm 06/11 neurology consultation 06/12 anuric renal failure, shock requiring high dose vasopressors 06/12 echocardiogram: LVEF 10% 06/13 Rewarmed. Possible seizure. CRRT initiated 06/13 CT head: cerebral edema 06/13 EEG: This is an abnormal electroencephalogram due the presence of PLEDs that are triphasic in morphology.  Although most prevalent over the left hemisphere, this activity can be noted over the right hemisphere independently and also have a more generalized distribution 06/13 per neurology consultant, prognosis for neurologic recovery deemed poor 06/14 palliative care consultation 06/15 progressive pulmonary infiltrates c/w edema vs ARDS 06/15 goals of care discussion with family. Full code desired 06/15 EEG: This is an abnormal electroencephalogram secondary to the absence of cerebral activity 06/16 worsening oxygenation. Worsening pulmonary infiltrates. Remains on high dose vasopressors. Remains on CRRT. Remains minimally responsive 06/17 worsening pulmonary infiltrates and left pleural effusion. Remains on CRRT. Remains on high-dose vasopressors. Remains minimally responsive 06/18 severe ARDS pattern on x-ray requiring high FiO2/PEEP. Remains on CRRT. On maximum dose vasopressors. Remains minimally responsive but with some spontaneous ventilatory effort 06/18 mother and father updated at bedside. Goals of care again addressed. They  were informed that he is on maximum support. Made DNR in event of cardiac arrest. To consider discontinuation of life-sustaining therapies later in the week 06/19 Tachyarrhythmias. Amiodarone increased. Milrinone stopped. Remains on maximum support with vasopressors, mech vent and CRRT 06/20 No major change overall. Remains on maximum vent support, high dose vasopressors, CRRT. Remains minimally responsive. Vasopressin discontinued. Amiodarone changed to enteral route 06/21 no major changes. No sign of meaningful improvement   INDWELLING DEVICES:: ETT 06/11 >>  L IJ CVL 06/11 >> 06/15 R IJ HD cath 06/13 >>  RUE PICC 06/15 >>    MICRO DATA: MRSA PCR 06/11 >> NEG Resp 06/11 >> consistent with oral flora Blood 06/11 >> NEG Blood 06/18 >> NEG Resp 06/18 >> resp flora  ANTIMICROBIALS:  Ceftriaxone 06/11 X 1 dose  SUBJECTIVE:  Unfortunately patient's status deteriorated overnight. I came in and spoke with family discuss with them present situation to include refractory shock, refractory respiratory failure, severe acid-based derangement, renal failure. Spoke with him about withdrawal/comfort care measures. They wish to continue aggressive management for another 24 hours. At the present time he is on maximal dose of pressors  VITAL SIGNS: BP 100/72 (BP Location: Right Wrist)   Pulse (!) 157   Temp 100.2 F (37.9 C) (Core (Comment))   Resp (!) 29   Ht 6' (1.829 m)   Wt (!) 224.1 kg (494 lb)   SpO2 (!) 88%   BMI 67.00 kg/m    VENTILATOR SETTINGS: Vent Mode: PRVC FiO2 (%):  [100 %] 100 % Set Rate:  [18 bmp-24 bmp] 20 bmp Vt Set:  [550 mL] 550 mL PEEP:  [15 cmH20] 15 cmH20  INTAKE / OUTPUT: I/O last 3 completed shifts: In: 10455.5 [I.V.:9738.5; NG/GT:267; IV Piggyback:450] Out: 3932 [Urine:100; Emesis/NG output:550; Other:3282]  PHYSICAL EXAMINATION: General: Morbidly obese, fentanyl infusion, RASS -4, -5  Neuro: Spontaneous ventilatory effort, minimal spontaneous  movement,  occasional myoclonic-like movement HEENT: NCAT, severe scleral edema Cardiovascular: Tachy, IR IR, no murmurs noted Lungs: coarse bilateral breath sounds without wheezes Abdomen: Severely obese, bowel sounds not audible Ext: cool, diminished to absent pulses, symmetric edema in all extremities  LABS:  BMET  Recent Labs Lab 09/18/16 1304 09/18/16 2117 09/05/2016 0425  NA 135 135 140  K 3.9 4.1 4.1  CL 104 104 104  CO2 25 25 27   BUN 36* 36* 35*  CREATININE 2.29* 2.61* 2.59*  GLUCOSE 130* 107* 145*    Electrolytes  Recent Labs Lab 09/18/16 1304 09/18/16 2117 09/23/2016 0425  CALCIUM 8.5* 8.3* 8.2*  MG 1.8 1.8 1.8  PHOS 2.3* 2.7 3.3    CBC  Recent Labs Lab 09/17/16 0443 09/18/16 0505 09/17/2016 0425  WBC 17.0* 22.5* 42.8*  HGB 9.9* 9.7* 8.9*  HCT 30.5* 29.7* 28.0*  PLT 102* 115* 138*    Coag's  Recent Labs Lab 09/18/16 1304  APTT 42*  INR 1.15    Sepsis Markers No results for input(s): LATICACIDVEN, PROCALCITON, O2SATVEN in the last 168 hours.  ABG  Recent Labs Lab 09/14/16 0400 09/18/16 2358 09/17/2016 0230  PHART 7.25* 7.08* 7.20*  PCO2ART 61* 63* 63*  PO2ART 61* 37* 44*    Liver Enzymes  Recent Labs Lab 09/14/16 0513  09/16/16 0419  09/17/16 0443  09/18/16 1304 09/18/16 2117 08/30/2016 0425  AST 29  --  26  --  30  --   --   --   --   ALT 10*  --  11*  --  13*  --   --   --   --   ALKPHOS 73  --  69  --  69  --   --   --   --   BILITOT 1.9*  --  2.7*  --  3.2*  --   --   --   --   ALBUMIN 2.0*  < > 2.3*  2.4*  < > 2.4*  2.5*  < > 2.3* 2.3* 2.3*  < > = values in this interval not displayed.  Cardiac Enzymes  Recent Labs Lab 09/13/16 0811 09/13/16 1237 09/13/16 1815  TROPONINI 0.05* 0.06* 0.05*    Glucose  Recent Labs Lab 09/18/16 1134 09/18/16 1604 09/18/16 1957 09/10/2016 0022 09/11/2016 0425 09/10/2016 0724  GLUCAP 131* 122* 97 146* 127* 132*    CXR: NNF   ASSESSMENT / PLAN:  PULMONARY A: Acute hypoxemic  respiratory failure VDRF post cardiac arrest Severe pulmonary edema +/- ARDS   Patient is unable to ventilate/oxygenate. Has been receiving boluses of bicarbonate secondary to acidosis although predominantly respiratory. Unable to take off fluid with CRRT so have been giving boluses as opposed to a drip. He is on 100% and 15 of PEEP, has received transient doses of paralytics. Chest x-ray reveals bilateral diffuse airspace filling process  CARDIOVASCULAR A:  S/P out of hospital cardiac arrest 06/11 Severe cardiomyopathy (LVEF 10%) Chronic atrial fibrillation Persistent cardiogenic shock P:  Patient is presently on Neo-Synephrine, norepinephrine, vasopressin, with systolic blood pressure of 86. Refractory shock, mixed cardiogenic and septic  RENAL A:   Oligo-anuric AKI post cardiac arrest Hypervolemia P:   Monitor BMET intermittently Monitor I/Os Correct electrolytes as indicated Continue CRRT per Renal Service  GASTROINTESTINAL A:   Morbid obesity Gastric ileus P:   SUP: Enteral PPI TF protocol - trickle  HEMATOLOGIC A:   Mild ICU acquired anemia without acute blood loss Mild thrombocytopenia P:  DVT px:  SQ heparin Monitor CBC intermittently Transfuse per usual guidelines   INFECTIOUS A:   No definite infection identified P:   White count slowly increasing now 22.5 Will resend blood cultures and start vancomycin and Zosyn  ENDOCRINE A:   Type II DM, controlled P:   Continue Lantus - dose reduced 06/21 Continue SSI  NEUROLOGIC A:   Severe anoxic encephalopathy Cerebral edema  Very poor neurological prognosis P:   RASS goal: -1, -2 Continue fentanyl infusion for ventilator synchrony Palliative Care working with family  Likely terminal extubation 06/23   FAMILY: Father updated   CCM time: 40 mins The above time includes time spent in consultation with patient and/or family members and reviewing care plan on multidisciplinary rounds  Tora Kindred, DO PCCM service Mobile [phone] Pager [phone]  October 10, 2016, 9:29 AM

## 2016-09-27 NOTE — Progress Notes (Signed)
PHARMACY - CRITICAL CARE PROGRESS NOTE  Pharmacy Consult CRRT Dosage Adjustments   No Known Allergies  Patient Measurements: Height: 6' (182.9 cm) Weight: (!) 494 lb (224.1 kg) IBW/kg (Calculated) : 77.6  Vital Signs: Temp: 100.4 F (38 C) (06/23 1000) Temp Source: Core (Comment) (06/23 0800) BP: 98/82 (06/23 1000) Pulse Rate: 149 (06/23 1000) Intake/Output from previous day: 06/22 0701 - 06/23 0700 In: 7745.4 [I.V.:7148.4; NG/GT:147; IV Piggyback:450] Out: 2091 [Urine:40; Emesis/NG output:200] Intake/Output from this shift: Total I/O In: 782.4 [I.V.:782.4] Out: 434 [Other:434] Vent settings for last 24 hours: Vent Mode: PRVC FiO2 (%):  [100 %] 100 % Set Rate:  [18 bmp-24 bmp] 20 bmp Vt Set:  [550 mL] 550 mL PEEP:  [15 cmH20] 15 cmH20  Labs:  Recent Labs  09/17/16 0443  09/18/16 0505 09/18/16 1304 09/18/16 2117 12-27-2016 0425  WBC 17.0*  --  22.5*  --   --  42.8*  HGB 9.9*  --  9.7*  --   --  8.9*  HCT 30.5*  --  29.7*  --   --  28.0*  PLT 102*  --  115*  --   --  138*  APTT  --   --   --  42*  --   --   INR  --   --   --  1.15  --   --   CREATININE 1.92*  2.00*  < > 2.14* 2.29* 2.61* 2.59*  MG 1.9  < > 1.8 1.8 1.8 1.8  PHOS 2.4*  < > 2.4* 2.3* 2.7 3.3  ALBUMIN 2.4*  2.5*  < > 2.4* 2.3* 2.3* 2.3*  PROT 6.4*  --   --   --   --   --   AST 30  --   --   --   --   --   ALT 13*  --   --   --   --   --   ALKPHOS 69  --   --   --   --   --   BILITOT 3.2*  --   --   --   --   --   < > = values in this interval not displayed. Estimated Creatinine Clearance: 67.2 mL/min (A) (by C-G formula based on SCr of 2.59 mg/dL (H)).   Recent Labs  12-27-2016 0022 12-27-2016 0425 12-27-2016 0724  GLUCAP 146* 127* 132*    Assessment: 51 y/o M s/p cardiac arrest and Code ICE on ventilation, sedation, vasopressors, and CRRT.   Plan:  No medications require adjustment for CRRT at present.   Luisa Harthristy, Myrle Dues D 2016-05-30,10:27 AM

## 2016-09-27 NOTE — Progress Notes (Signed)
Pt.'s cardiac time of death was [id number].  Pronounced by Garner NashJaime Smith, RN & Cheryll CockayneBritton Lee Rust-Chester, RN. [facility], nursing supervisor called and funeral home arrangements made. Pt.'s mother and father bedside with RN, spiritual supportive services offered but declined.  CDS called and pt. Released, not an eligible donor. (see flow sheets for reference #) Pt.'s parents given security key for glucometer locked up, security called and alerted of circumstances.  2100Lambert Mody: [facility] came bedside and collected the body Pt.'s mother was called, after leaving that we still had a couple of belongings bedside.  Pt.'s mother was told these belongings are labelled and at the front desk for pick up.

## 2016-09-27 NOTE — Progress Notes (Signed)
Nurse told CH she was going to talk to pt's family about end of life and wanted CH to be present. No too long pt's mother arrived. The nurse spoke to pt's mother to prepare her for the next step of action stopping the blood pressure medicine. However, the mother appeared not to be ready at this time. While the family know that the pt will not make it, they are still holding to slim chance that something could change. CH provided emotion support and presence for the pt and family and continued to be present with the family and nurse.    06-30-16 2000  Clinical Encounter Type  Visited With Patient and family together  Visit Type Initial;Follow-up;Spiritual support  Referral From Nurse  Consult/Referral To Chaplain  Spiritual Encounters  Spiritual Needs Prayer;Emotional  Stress Factors  Patient Stress Factors Exhausted;Major life changes  Family Stress Factors Exhausted;Major life changes;Other (Comment)

## 2016-09-27 NOTE — Progress Notes (Signed)
Patient ID: Isaiah [name], male   DOB: March 07, 1966, 51 y.o.   MRN: [id number]   [facility] PROGRESS NOTE  STETSON PELAEZ OZH:[id number] DOB: [birth year] DOA: 09/01/2016 PCP: Patient, No Pcp Per  HPI/Subjective: Remains on the ventilator. No significant improvement   Objective: Vitals:   10-10-16 1300 10-Oct-2016 1400  BP: 125/73 (!) 76/47  Pulse: (!) 138 (!) 126  Resp: (!) 27 (!) 30  Temp: (!) 101.3 F (38.5 C) (!) 101.7 F (38.7 C)    Filed Weights   09/17/16 0700 09/18/16 0500 10-10-2016 0500  Weight: (!) 222.3 kg (490 lb) (!) 196 kg (432 lb) (!) 224.1 kg (494 lb)    ROS: Review of Systems  Unable to perform ROS: Critical illness   Exam: Physical Exam  HENT:  Nose: No mucosal edema.  Unable to look into mouth at this time  Eyes: Conjunctivae and lids are normal.  Pupils pinpoint  Neck: Carotid bruit is not present. No thyroid mass and no thyromegaly present.  Cardiovascular: S1 normal and S2 normal.  An irregularly irregular rhythm present. Tachycardia present.  Exam reveals distant heart sounds. Exam reveals no gallop.   No murmur heard. Pulses:      Dorsalis pedis pulses are 2+ on the right side, and 2+ on the left side.  Respiratory: No respiratory distress. He has decreased breath sounds in the right middle field, the right lower field, the left middle field and the left lower field. He has wheezes in the right middle field and the left middle field. He has rhonchi in the right lower field and the left lower field. He has no rales.  GI: Soft. Bowel sounds are normal. There is no tenderness.  Musculoskeletal:       Right knee: He exhibits swelling.       Left knee: He exhibits swelling.       Right ankle: He exhibits swelling.       Left ankle: He exhibits swelling.  Lymphadenopathy:    He has no cervical adenopathy.  Neurological: He is unresponsive.  Unresponsive to painful stimuli  Skin: No rash noted. Nails show no clubbing.  Psychiatric:  Unable to  assess at this time secondary to unresponsive      Data Reviewed: Basic Metabolic Panel:  Recent Labs Lab 09/17/16 2101 09/18/16 0505 09/18/16 1304 09/18/16 2117 10/10/16 0425  NA 135 136 135 135 140  K 3.7 3.9 3.9 4.1 4.1  CL 106 105 104 104 104  CO2 24 25 25 25 27   GLUCOSE 101* 127* 130* 107* 145*  BUN 35* 33* 36* 36* 35*  CREATININE 2.16* 2.14* 2.29* 2.61* 2.59*  CALCIUM 8.2* 8.4* 8.5* 8.3* 8.2*  MG 1.8 1.8 1.8 1.8 1.8  PHOS 2.2* 2.4* 2.3* 2.7 3.3   Liver Function Tests:  Recent Labs Lab 09/14/16 0513  09/16/16 0419  09/17/16 0443  09/17/16 2101 09/18/16 0505 09/18/16 1304 09/18/16 2117 2016-10-10 0425  AST 29  --  26  --  30  --   --   --   --   --   --   ALT 10*  --  11*  --  13*  --   --   --   --   --   --   ALKPHOS 73  --  69  --  69  --   --   --   --   --   --   BILITOT 1.9*  --  2.7*  --  3.2*  --   --   --   --   --   --   PROT 6.2*  --  6.3*  --  6.4*  --   --   --   --   --   --   ALBUMIN 2.0*  < > 2.3*  2.4*  < > 2.4*  2.5*  < > 2.2* 2.4* 2.3* 2.3* 2.3*  < > = values in this interval not displayed. CBC:  Recent Labs Lab 09/15/16 0420 09/16/16 0419 09/17/16 0443 09/18/16 0505 10/12/16 0425  WBC 14.8* 16.0* 17.0* 22.5* 42.8*  HGB 10.0* 10.0* 9.9* 9.7* 8.9*  HCT 30.8* 30.9* 30.5* 29.7* 28.0*  MCV 80.8 79.0* 79.9* 81.6 81.8  PLT 110* 106* 102* 115* 138*   Cardiac Enzymes:  Recent Labs Lab 09/13/16 0811 09/13/16 1237 09/13/16 1815  TROPONINI 0.05* 0.06* 0.05*   BNP (last 3 results)  Recent Labs  09/12/2016 0750  BNP 508.0*     CBG:  Recent Labs Lab 09/18/16 1957 2016/10/12 0022 10-12-16 0425 2016-10-12 0724 10-12-16 1217  GLUCAP 97 146* 127* 132* 123*    Recent Results (from the past 240 hour(s))  Culture, respiratory (NON-Expectorated)     Status: None   Collection Time: 09/14/16 12:21 AM  Result Value Ref Range Status   Specimen Description TRACHEAL ASPIRATE  Final   Special Requests NONE  Final   Gram Stain   Final     ABUNDANT WBC PRESENT, PREDOMINANTLY PMN NO SQUAMOUS EPITHELIAL CELLS SEEN NO ORGANISMS SEEN    Culture   Final    NO GROWTH Performed at [facility], [address] [place], [place] [address]    Report Status 09/16/2016 FINAL  Final  CULTURE, BLOOD (ROUTINE X 2) w Reflex to ID Panel     Status: None (Preliminary result)   Collection Time: 09/18/16  8:52 AM  Result Value Ref Range Status   Specimen Description BLOOD RIGHT ANTECUBITAL  Final   Special Requests PEDIATRICS Blood Culture adequate volume  Final   Culture NO GROWTH < 24 HOURS  Final   Report Status PENDING  Incomplete  Culture, respiratory (NON-Expectorated)     Status: None (Preliminary result)   Collection Time: 09/18/16 11:36 AM  Result Value Ref Range Status   Specimen Description TRACHEAL ASPIRATE  Final   Special Requests NONE  Final   Gram Stain NO WBC SEEN NO ORGANISMS SEEN   Final   Culture   Final    CULTURE REINCUBATED FOR BETTER GROWTH Performed at [facility], [place] [address], [place], [place] [address]    Report Status PENDING  Incomplete  Culture, blood (Routine X 2) w Reflex to ID Panel     Status: None (Preliminary result)   Collection Time: 09/18/16  5:39 PM  Result Value Ref Range Status   Specimen Description BLOOD CENTRAL LINE  Final   Special Requests   Final    BOTTLES DRAWN AEROBIC AND ANAEROBIC Blood Culture adequate volume   Culture NO GROWTH < 12 HOURS  Final   Report Status PENDING  Incomplete     Studies: Dg Chest Port 1 View  Result Date: October 12, 2016 CLINICAL DATA:  Lung abnormality. EXAM: PORTABLE CHEST 1 VIEW COMPARISON:  Yesterday at 0450 hour FINDINGS: Endotracheal tube 5.7 cm from the carina. Enteric tube is present but not well visualized. Right internal jugular central venous catheter tip at the atrial caval junction. Worsening right lung aeration, likely combination of pleural effusion  and airspace disease/edema. Left perihilar opacities are grossly similar.  Cardiomegaly is stable. No pneumothorax. IMPRESSION: 1. Worsening right lung aeration likely combination of airspace disease/edema and pleural fluid. 2. Stable cardiomegaly and left lung opacities. 3. Stable support apparatus. Electronically Signed   By: Jeb Levering M.D.   On: Sep 20, 2016 01:31   Dg Chest Port 1 View  Result Date: 09/18/2016 CLINICAL DATA:  Respiratory failure. EXAM: PORTABLE CHEST 1 VIEW COMPARISON:  09/17/2016 . FINDINGS: Endotracheal tube, NG tube, right IJ sheath, left PICC line in stable position. Cardiomegaly with diffuse dense bilateral pulmonary alveolar infiltrates/edema. No interim change. Small pleural effusions cannot be excluded. No pneumothorax . IMPRESSION: 1. Lines and tubes in stable position. 2. Cardiomegaly with diffuse dense bilateral pulmonary alveolar infiltrates/edema. No interim change. Small bilateral pleural effusions cannot be excluded . Electronically Signed   By: Marcello Moores  Register   On: 09/18/2016 06:21   Dg Chest Port 1 View  Result Date: 09/17/2016 CLINICAL DATA:  Central line placement. EXAM: PORTABLE CHEST 1 VIEW COMPARISON:  Chest radiograph September 16, 2016 FINDINGS: Habitus limited examination. Endotracheal tube tip projects approximately 5 cm above the carina. Dialysis catheter via RIGHT internal jugular venous approach projecting at proximal atrium. LEFT PICC distal tip at approximate brachiocephalic confluence. Nasogastric tube in place, and not well visualized distal to the mid esophageal region. Cardiomegaly and pulmonary vascular congestion, interstitial prominence and slightly decreased alveolar airspace opacities. IMPRESSION: Habitus limited examination. No apparent change in life-support lines. Cardiomegaly and slightly decreased pulmonary edema. Electronically Signed   By: Elon Alas M.D.   On: 09/17/2016 20:25    Scheduled Meds: . chlorhexidine gluconate (MEDLINE KIT)  15 mL Mouth Rinse BID  . insulin aspart  0-15 Units Subcutaneous  Q4H  . insulin glargine  10 Units Subcutaneous Daily  . ipratropium-albuterol  3 mL Nebulization Q6H  . mouth rinse  15 mL Mouth Rinse 10 times per day  . pantoprazole sodium  40 mg Per Tube Q1200  . sennosides  5 mL Per Tube BID  . sodium chloride flush  10-40 mL Intracatheter Q12H   Continuous Infusions: . sodium chloride 10 mL/hr at Sep 20, 2016 0600  . albumin human    . amiodarone 60 mg/hr (09-20-16 1345)  . amiodarone    . dextrose 30 mL/hr at Sep 20, 2016 0600  . fentaNYL infusion INTRAVENOUS 400 mcg/hr (September 20, 2016 0925)  . heparin 1,550 Units/hr (20-Sep-2016 1020)  . norepinephrine (LEVOPHED) Adult infusion 45 mcg/min (09/20/2016 1451)  . phenylephrine (NEO-SYNEPHRINE) Adult infusion 400 mcg/min (09/20/16 1401)  . piperacillin-tazobactam (ZOSYN)  IV Stopped (09-20-16 1004)  . pureflow Stopped (09/20/2016 0403)  . vancomycin Stopped (2016-09-20 1425)  . vasopressin (PITRESSIN) infusion - *FOR SHOCK* 0.03 Units/min (September 20, 2016 0600)    Assessment/Plan:  1. Cardiac arrest With multiorgan failure. Suspect due to arrhythmia related with an EF of 10% seen on echocardiogram.  Overall prognosis poor with anoxic encephalopathy. No significant improvement in his conditionChance of recovery unlikely. 2. 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  With EF of 10% his overall prognosis is poor.  Supportive care and blood pressure support 3. Acute kidney injury secondary to cardiac arrest, CT scan with contrast. Continue CRRT 4. Anoxic encephalopathy with brain edema. Acute encephalopathy. Patient currently on sedation with fentanyl  Previous EEG showing no seizures. Patient started on valproate sodium.  Neurology follow up.  Palliative care team following prognosis poor 5. Acute hypoxic respiratory failure. Patient has been on 100% FiO2 6. Hypotension-  Continue Levophed , phenylephrine, vasopressin. 7. Type 2 diabetes mellitus. On sliding scale and low-dose  Levemir. 8. Rapid Atrial fibrillation.  heparin drip.  amiodarone drip  9. Morbid obesity and likely sleep apnea. 10. Anemia likely anemia of chronic disease     Code Status:     Code Status Orders        Start     Ordered   09/10/2016 1005  Full code  Continuous     09/03/2016 1007    Code Status History    Date Active Date Inactive Code Status Order ID Comments User Context   09/26/2016  8:30 AM 09/08/2016 10:08 AM Full Code [id number]  Loletha Grayer, MD ED     Family Communication: No family currently available Consultants:  Critical care specialist  Cardiology  Neurology  Nephrology  Palliative care consultation  Time spent: 32 minutes  [facility]           googel

## 2016-09-27 NOTE — Progress Notes (Addendum)
0020: Alerted Ms. Luci Bankukov, NP that pt. Is hypotensive with both pressors at max.  Pt. Being bagged with PEEP adapter to improve ventilation: O2 is btwn 60%-70%. RN moved blood flow down to 200 and turned off UF on CRRT during critical event. NP increased max dose on levo and added vaso gtt.  0045: Post ABG results, Dr. Arlyn Leaketering ordered 4 amp bicarb push and a bicarb gtt. Will restart UF on CRRT once vaso and bicarb gtt are running. NP called mother and father to come bedside due to changes.  0100: Pt.'s HR began to increase to high's of 180's.  NP ordered amio bolus. Gave vec. PRN in an attempt to improve ventilation.  Pt. Still being bagged by RT on and off attempting to recruit lungs and improve ventilation.  NP is aware that the pt. Will improve with bagging slowly and then drop O2 sats again. NP & MD aware that pt. Sats are not WDL

## 2016-09-27 NOTE — Progress Notes (Signed)
2300: Pt.'s HR dropped from 140's to 70's and O2 sat dropped into 70's. Dr. Darrick Pennaeterding ordered recruitment measures. 0000: post recruitment measures, pt. Still not improving- O2 monitor changed but not tracking well.  MD ordered ABG.  Ms. Luci Bankukov, NP informed of pt. Status and MD's orders during this time.

## 2016-09-27 NOTE — Progress Notes (Signed)
SUBJECTIVE: Patient's SPO2 dropped to the low 60s, HR to the low 70s from 140s and MAP to the low 30s. Patient is currently on two pressors, po amiodarone and vent settings  Vent Mode: PRVC FiO2 (%):  [100 %] 100 % Set Rate:  [18 bmp-24 bmp] 20 bmp Vt Set:  [550 mL] 550 mL PEEP:  [15 cmH20] 15 cmH20   Elink notified and the following changes were made:  Vent recuperation measures and vasopressin added as well as increasing levophed to 65 mcg. STAT ABG show mixed acidosis with a pH of 7.08, PCO2 63, PO2 37, HCO3 18.7 and SPO2 46.4%  4 AMPs of bicarb given, bicarb infusion,  BVM ventilation, vecuronium and vasopressin started. Patient's indices improved for about 20 minutes and then declined again. Patient's parents and Dr. Jefferson Fuel notified.    REPEAT ABG SHOWED MILD IMPROVEMENT BUT LESS OPTIMAL SPO2, pH, PCO2 and PO2    Component Value Date/Time   PHART 7.20 (L) 09-28-16 0230   PCO2ART 63 (H) 2016-09-28 0230   PO2ART 44 (L) 28-Sep-2016 0230   HCO3 24.6 09/28/16 0230   ACIDBASEDEF 3.8 (H) 09-28-2016 0230   O2SAT 67.6 09/28/2016 0230   STAT CXR showed severe volume overload with possible ARDS. Patient is on CRRT with ultrafiltration of 150cc/hr  O: On exam patient remains comatose, anasarca, tachycardic with no corneal or pupillary reflexes  A/P Continue vent recuperation measures Maintain on current vent settings Bicarb pushes every 4hrs; will d./c infusion due to volume overload Restart amiodarone gtte Continue levo, neo and vaso to maintain MAP 60 and above Dr. Jefferson Fuel and myself met with the patient's mother and family at length to discuss prognosis and goals of care. We relayed to them that patient is in multiorgan failure with extremely limited changes of meaningful recovery. Family requested chaplain and after much reflection indicated that they will continue with full treatment for another 24 hrs. Patient will remain a DNR. Support provided and all questions answered. Will  continue to monitor and update parents  Isaiah [name] S. [facility] ANP-BC Pulmonary and Critical Care Medicine [facility] Pager [phone] or [phone]

## 2016-09-27 NOTE — Progress Notes (Signed)
Dr. Lonn Georgiaonforti came bedside and spoke with family at length r/t pt.'s status and prognosis.  Chaplain was also called bedside to offer support.  MD ordered 4 more amps of bicarb after looking at pt. And CXR. Pt. Placed on amio gtt.  Mother and father stated they wanted to give the pt. An additional 24 hours to improve before they made a decision regarding care.

## 2016-09-27 NOTE — Progress Notes (Signed)
   02/18/2017 0218  Clinical Encounter Type  Visited With Health care provider;Patient not available;Patient  Visit Type Initial;Critical Care  Referral From Nurse  Consult/Referral To Chaplain   Chaplain responded to page. Patient health declining. End of life care will be discussed with family. Chaplain will return to speak with family.

## 2016-09-27 NOTE — Progress Notes (Addendum)
[facility]  ROUNDING NOTE   Subjective:   Patient remains critically ill,intubated and sedated Requiring high dose of multiple pressors-  Obligate intake has increased to ~300 cc/ hr Tolerated CRRT UF 150 cc / hr  UOP- anuric Remains ventilator dependent; Fio2 100%, PEEP 15 Nursing staff report some difficulty with BFR- decreased to 200/hr now Continued on iv heparin drip to prevent dialyzer clotting  Objective:  Vital signs in last 24 hours:  Temp:  [98.8 F (37.1 C)-100.4 F (38 C)] 100.4 F (38 C) (06/23 1000) Pulse Rate:  [74-175] 149 (06/23 1000) Resp:  [0-35] 26 (06/23 1000) BP: (51-120)/(36-97) 98/82 (06/23 1000) SpO2:  [56 %-99 %] 85 % (06/23 1000) FiO2 (%):  [100 %] 100 % (06/23 1100) Weight:  [224.1 kg (494 lb)] 224.1 kg (494 lb) (06/23 0500)  Weight change: 28.1 kg (62 lb) Filed Weights   09/17/16 0700 09/18/16 0500 2016-10-12 0500  Weight: (!) 222.3 kg (490 lb) (!) 196 kg (432 lb) (!) 224.1 kg (494 lb)    Intake/Output: I/O last 3 completed shifts: In: 10455.5 [I.V.:9738.5; NG/GT:267; IV Piggyback:450] Out: 8032 [Urine:100; Emesis/NG output:550; Other:3282]   Intake/Output this shift:  Total I/O In: 782.4 [I.V.:782.4] Out: 434 [Other:434]  Physical Exam: General: Critically ill   H, ENT ETT, OGT  Eyes: Eyes closed. Conjunctival edema with conjunctival hemorrhage  Neck: No masses  Lungs:  Diminished, vent assisted  Heart: Irregular, tachycardia  Abdomen:  Soft,  obese  Extremities: + dependent  peripheral edema; anasarca  Neurologic: sedated  Skin: No acute rashes  Access: RIJ temp HD catheter      Basic Metabolic Panel:  Recent Labs Lab 09/17/16 2101 09/18/16 0505 09/18/16 1304 09/18/16 2117 10-12-16 0425  NA 135 136 135 135 140  K 3.7 3.9 3.9 4.1 4.1  CL 106 105 104 104 104  CO2 24 25 25 25 27   GLUCOSE 101* 127* 130* 107* 145*  BUN 35* 33* 36* 36* 35*  CREATININE 2.16* 2.14* 2.29* 2.61* 2.59*  CALCIUM 8.2* 8.4* 8.5*  8.3* 8.2*  MG 1.8 1.8 1.8 1.8 1.8  PHOS 2.2* 2.4* 2.3* 2.7 3.3    Liver Function Tests:  Recent Labs Lab 09/14/16 0513  09/16/16 0419  09/17/16 0443  09/17/16 2101 09/18/16 0505 09/18/16 1304 09/18/16 2117 Oct 12, 2016 0425  AST 29  --  26  --  30  --   --   --   --   --   --   ALT 10*  --  11*  --  13*  --   --   --   --   --   --   ALKPHOS 73  --  69  --  69  --   --   --   --   --   --   BILITOT 1.9*  --  2.7*  --  3.2*  --   --   --   --   --   --   PROT 6.2*  --  6.3*  --  6.4*  --   --   --   --   --   --   ALBUMIN 2.0*  < > 2.3*  2.4*  < > 2.4*  2.5*  < > 2.2* 2.4* 2.3* 2.3* 2.3*  < > = values in this interval not displayed. No results for input(s): LIPASE, AMYLASE in the last 168 hours. No results for input(s): AMMONIA in the last 168 hours.  CBC:  Recent Labs  Lab 09/15/16 0420 09/16/16 0419 09/17/16 0443 09/18/16 0505 2016-10-05 0425  WBC 14.8* 16.0* 17.0* 22.5* 42.8*  HGB 10.0* 10.0* 9.9* 9.7* 8.9*  HCT 30.8* 30.9* 30.5* 29.7* 28.0*  MCV 80.8 79.0* 79.9* 81.6 81.8  PLT 110* 106* 102* 115* 138*    Cardiac Enzymes:  Recent Labs Lab 09/13/16 0811 09/13/16 1237 09/13/16 1815  TROPONINI 0.05* 0.06* 0.05*    BNP: Invalid input(s): POCBNP  CBG:  Recent Labs Lab 09/18/16 1604 09/18/16 1957 10-05-16 0022 10/05/16 0425 2016-10-05 0724  GLUCAP 122* 66 146* 127* 73*    Microbiology: Results for orders placed or performed during the hospital encounter of 08/31/2016  Culture, blood (routine x 2)     Status: None   Collection Time: 09/13/2016  8:18 AM  Result Value Ref Range Status   Specimen Description BLOOD L AC  Final   Special Requests   Final    BOTTLES DRAWN AEROBIC AND ANAEROBIC Blood Culture adequate volume   Culture NO GROWTH 9 DAYS  Final   Report Status 09/16/2016 FINAL  Final  MRSA PCR Screening     Status: None   Collection Time: 09/05/2016  9:46 AM  Result Value Ref Range Status   MRSA by PCR NEGATIVE NEGATIVE Final    Comment:         The GeneXpert MRSA Assay (FDA approved for NASAL specimens only), is one component of a comprehensive MRSA colonization surveillance program. It is not intended to diagnose MRSA infection nor to guide or monitor treatment for MRSA infections.   Culture, blood (routine x 2)     Status: None   Collection Time: 09/17/2016 11:45 AM  Result Value Ref Range Status   Specimen Description BLOOD L HAND  Final   Special Requests   Final    BOTTLES DRAWN AEROBIC ONLY Blood Culture results may not be optimal due to an inadequate volume of blood received in culture bottles   Culture NO GROWTH 9 DAYS  Final   Report Status 09/16/2016 FINAL  Final  Culture, respiratory (NON-Expectorated)     Status: None   Collection Time: 09/10/2016  6:32 PM  Result Value Ref Range Status   Specimen Description TRACHEAL ASPIRATE  Final   Special Requests NONE  Final   Gram Stain   Final    FEW WBC PRESENT, PREDOMINANTLY PMN MODERATE GRAM POSITIVE COCCI IN CHAINS IN PAIRS MODERATE GRAM NEGATIVE RODS    Culture   Final    Consistent with normal respiratory flora. Performed at [facility], [place] [address], [place], [place] [address]    Report Status 09/10/2016 FINAL  Final  Culture, respiratory (NON-Expectorated)     Status: None   Collection Time: 09/14/16 12:21 AM  Result Value Ref Range Status   Specimen Description TRACHEAL ASPIRATE  Final   Special Requests NONE  Final   Gram Stain   Final    ABUNDANT WBC PRESENT, PREDOMINANTLY PMN NO SQUAMOUS EPITHELIAL CELLS SEEN NO ORGANISMS SEEN    Culture   Final    NO GROWTH Performed at [facility], [place] [address], [place], [place] [address]    Report Status 09/16/2016 FINAL  Final  CULTURE, BLOOD (ROUTINE X 2) w Reflex to ID Panel     Status: None (Preliminary result)   Collection Time: 09/18/16  8:52 AM  Result Value Ref Range Status   Specimen Description BLOOD RIGHT ANTECUBITAL  Final   Special Requests PEDIATRICS Blood Culture adequate  volume  Final  Culture NO GROWTH < 24 HOURS  Final   Report Status PENDING  Incomplete  Culture, respiratory (NON-Expectorated)     Status: None (Preliminary result)   Collection Time: 09/18/16 11:36 AM  Result Value Ref Range Status   Specimen Description TRACHEAL ASPIRATE  Final   Special Requests NONE  Final   Gram Stain   Final    NO WBC SEEN NO ORGANISMS SEEN Performed at [facility], [place] [address] [place], [place] [address]    Culture PENDING  Incomplete   Report Status PENDING  Incomplete  Culture, blood (Routine X 2) w Reflex to ID Panel     Status: None (Preliminary result)   Collection Time: 09/18/16  5:39 PM  Result Value Ref Range Status   Specimen Description BLOOD CENTRAL LINE  Final   Special Requests   Final    BOTTLES DRAWN AEROBIC AND ANAEROBIC Blood Culture adequate volume   Culture NO GROWTH < 12 HOURS  Final   Report Status PENDING  Incomplete    Coagulation Studies:  Recent Labs  09/18/16 1304  LABPROT 14.8  INR 1.15    Urinalysis: No results for input(s): COLORURINE, LABSPEC, PHURINE, GLUCOSEU, HGBUR, BILIRUBINUR, KETONESUR, PROTEINUR, UROBILINOGEN, NITRITE, LEUKOCYTESUR in the last 72 hours.  Invalid input(s): APPERANCEUR    Imaging: Dg Chest Port 1 View  Result Date: 09/25/2016 CLINICAL DATA:  Lung abnormality. EXAM: PORTABLE CHEST 1 VIEW COMPARISON:  Yesterday at 0450 hour FINDINGS: Endotracheal tube 5.7 cm from the carina. Enteric tube is present but not well visualized. Right internal jugular central venous catheter tip at the atrial caval junction. Worsening right lung aeration, likely combination of pleural effusion and airspace disease/edema. Left perihilar opacities are grossly similar. Cardiomegaly is stable. No pneumothorax. IMPRESSION: 1. Worsening right lung aeration likely combination of airspace disease/edema and pleural fluid. 2. Stable cardiomegaly and left lung opacities. 3. Stable support apparatus. Electronically Signed    By: Jeb Levering M.D.   On: 09/25/16 01:31   Dg Chest Port 1 View  Result Date: 09/18/2016 CLINICAL DATA:  Respiratory failure. EXAM: PORTABLE CHEST 1 VIEW COMPARISON:  09/17/2016 . FINDINGS: Endotracheal tube, NG tube, right IJ sheath, left PICC line in stable position. Cardiomegaly with diffuse dense bilateral pulmonary alveolar infiltrates/edema. No interim change. Small pleural effusions cannot be excluded. No pneumothorax . IMPRESSION: 1. Lines and tubes in stable position. 2. Cardiomegaly with diffuse dense bilateral pulmonary alveolar infiltrates/edema. No interim change. Small bilateral pleural effusions cannot be excluded . Electronically Signed   By: Marcello Moores  Register   On: 09/18/2016 06:21   Dg Chest Port 1 View  Result Date: 09/17/2016 CLINICAL DATA:  Central line placement. EXAM: PORTABLE CHEST 1 VIEW COMPARISON:  Chest radiograph September 16, 2016 FINDINGS: Habitus limited examination. Endotracheal tube tip projects approximately 5 cm above the carina. Dialysis catheter via RIGHT internal jugular venous approach projecting at proximal atrium. LEFT PICC distal tip at approximate brachiocephalic confluence. Nasogastric tube in place, and not well visualized distal to the mid esophageal region. Cardiomegaly and pulmonary vascular congestion, interstitial prominence and slightly decreased alveolar airspace opacities. IMPRESSION: Habitus limited examination. No apparent change in life-support lines. Cardiomegaly and slightly decreased pulmonary edema. Electronically Signed   By: Elon Alas M.D.   On: 09/17/2016 20:25     Medications:   . sodium chloride 10 mL/hr at September 25, 2016 0600  . albumin human    . dextrose 30 mL/hr at 09/25/2016 0600  . fentaNYL infusion INTRAVENOUS 400 mcg/hr (09/25/16 0925)  .  heparin 1,550 Units/hr (10/08/16 1020)  . norepinephrine (LEVOPHED) Adult infusion 50 mcg/min (October 08, 2016 1004)  . phenylephrine (NEO-SYNEPHRINE) Adult infusion 400 mcg/min (10/08/2016 1003)   . piperacillin-tazobactam (ZOSYN)  IV Stopped (Oct 08, 2016 1004)  . pureflow Stopped (10/08/16 0403)  . vancomycin    . vasopressin (PITRESSIN) infusion - *FOR SHOCK* 0.03 Units/min (08-Oct-2016 0600)   . amiodarone  400 mg Per Tube BID  . chlorhexidine gluconate (MEDLINE KIT)  15 mL Mouth Rinse BID  . insulin aspart  0-15 Units Subcutaneous Q4H  . insulin glargine  10 Units Subcutaneous Daily  . ipratropium-albuterol  3 mL Nebulization Q6H  . mouth rinse  15 mL Mouth Rinse 10 times per day  . pantoprazole sodium  40 mg Per Tube Q1200  . sennosides  5 mL Per Tube BID  . sodium chloride flush  10-40 mL Intracatheter Q12H   acetaminophen **OR** acetaminophen, artificial tears, fentaNYL, heparin, LORazepam, midazolam, polyvinyl alcohol, sodium chloride flush, vecuronium  Assessment/ Plan:  Mr. HAU SANOR is a 51 y.o. black male with diabetes mellitus type II, hyeprtension, congestive heart failure, atrial fibrillation,  who was admitted to [facility] on 09/25/2016 for Cardiac arrest ([place]) [I46.9]  1. Acute renal failure: on chronic kidney disease stage III baseline creatinine of 1.4 04/10/16.  Nonoliguric. iv contrast exposure for CT angiogram at admission On CRRT 4K bath.  continued UF at 150 mL/hr - increase to 200 to 250 cc/hr as tolerated to match intake - continue iv Albumin - Monitor renal function, volume status, urine output and electrolytes.    2. Cardiogenic shock with hypotension, with anasarca: on vasopressors:   Supportive care continue iv albumin  3. Acute Respiratory Failure:  Requiring Vent support Broad spectrum Abx as per CCU/IM team  4.  Anoxic brain injury - as noted on CT head      LOS: 12 Chloey Ricard 07-12-201812:07 PM

## 2016-09-27 NOTE — Progress Notes (Signed)
   18-May-2016 0305  Clinical Encounter Type  Visited With Health care provider;Family  Visit Type Follow-up;Spiritual support;Critical Care  Referral From Nurse  Consult/Referral To Chaplain  Stress Factors  Family Stress Factors Exhausted;Major life changes;Loss  Advance Directives (For Healthcare)  Does Patient Have a Medical Advance Directive? No   Chaplain meet with family to provided comfort and emotional support. Patients parents want to give it another 24 hours to let God work. They understand that patient could pass within the next 24 hours. They are not ready to make a decision that could end the patient's life. Chaplain shared this information with Nurse per request of patients family.

## 2016-09-27 NOTE — Progress Notes (Signed)
Pharmacy Consult for Heparin Indication: atrial fibrillation/ dialysis catheter clotting  No Known Allergies  Patient Measurements: Height: 6' (182.9 cm) Weight: (!) 432 lb (196 kg) IBW/kg (Calculated) : 77.6 Heparin Dosing Weight: 134.6 kg  Vital Signs: Temp: 99.5 F (37.5 C) (06/23 0315) Temp Source: Core (Comment) (06/23 0000) BP: 93/78 (06/23 0300) Pulse Rate: 164 (06/23 0315)  Labs:  Recent Labs  09/16/16 0419  09/17/16 0443  09/18/16 0505 09/18/16 1304 09/18/16 1759 09/18/16 2117 09/10/2016 0228  HGB 10.0*  --  9.9*  --  9.7*  --   --   --   --   HCT 30.9*  --  30.5*  --  29.7*  --   --   --   --   PLT 106*  --  102*  --  115*  --   --   --   --   APTT  --   --   --   --   --  42*  --   --   --   LABPROT  --   --   --   --   --  14.8  --   --   --   INR  --   --   --   --   --  1.15  --   --   --   HEPARINUNFRC  --   --   --   --   --   --  0.10*  --  0.33  CREATININE 1.81*  2.00*  < > 1.92*  2.00*  < > 2.14* 2.29*  --  2.61*  --   < > = values in this interval not displayed.  Estimated Creatinine Clearance: 61.2 mL/min (A) (by C-G formula based on SCr of 2.61 mg/dL (H)).   Medical History: Past Medical History:  Diagnosis Date  . CHF (congestive heart failure) (HCC)   . Diabetes mellitus without complication (HCC)   . Hypertension    Assessment: 51 y/o M s/p cardiac arrest and code ICE with a h/o atrial fibrillation and issues with dialysis catheter clotting. Will initiate low dose heparin drip with lowered anti-Xa goal per nephrology.   Goal of Therapy:  Heparin Level 0.3-0.5 Monitor platelets by anticoagulation protocol: Yes   Plan:  Will initiate heparin without bolus at 1000 units/hr as discussed with nephrology.  Start heparin infusion at 1000 units/hr Check anti-Xa level in 6 hours and daily while on heparin Continue to monitor H&H and platelets   6/22:  HL @ 18:00 = 0.1 Will order Heparin 4000 units IV X 1 bolus and increase drip rate to  1400 units/hr. Will recheck HL 6 hrs after rate change.  6/23 @ 0230 HL 0.33 therapeutic. Will continue current regimen and will recheck HL @ 0830. CBC trending down.  Thomasene Rippleavid Mikita Lesmeister, PharmD, BCPS Clinical Pharmacist 09/12/2016

## 2016-09-27 NOTE — Death Summary Note (Signed)
DEATH SUMMARY   Patient Details  Name: Isaiah [name] MRN: [id number] DOB: [birth year]  Admission/Discharge Information   Admit Date:  22-Sep-2016  Date of Death: Date of Death: 2016-10-04  Time of Death: Time of Death: 08/21/1922  Length of Stay: 08/24/22  Referring Physician: Patient, No Pcp Per   Reason(s) for Hospitalization  Cardiac arrest With multiorgan failure likely due to arrhythmia related with an EF of 10%  and multiorgan failure  Diagnoses  Preliminary cause of death:  Secondary Diagnoses (including complications and co-morbidities):  Active Problems:   Cardiac arrest (HCC)   Pressure injury of skin   Anoxic-ischemic encephalopathy (HCC)   Palliative care by specialist   End stage congestive heart failure (HCC)   Chronic respiratory failure with hypoxia (HCC)   Acute renal failure (HCC)   Anoxic encephalopathy (HCC)   Palliative care encounter   Brief Hospital Course (including significant findings, care, treatment, and services provided and events leading to death)  Isaiah [name] is a 51 y.o. year old male who was admitted with an unwitnessed cardiac arrest at home. Initial rhythm was asystole and then VF. EMS and the ED performed CPR for a total downtime of approximately 45 minutes. His initial CT head was normal but the subsequent CT head showed diffuse cerebral edema consistent with anoxic brain injury. Patient was maintained on full vent support and required pressors. He progressed in to multiorgan failure and was started on CRRT. Despite CRRT, patient's health status continued to decline with worsening hypoxemia, hypercarbia, shock and acidosis.Due to his worsening health status, the family decided to make him a DNR. This evening, patient's HR dropped gradually and he went into asystole. Patient pronounced by RN. Time of death 79   Pertinent Labs and Studies  Significant Diagnostic Studies Dg Chest 1 View  Result Date: 09/14/2016 CLINICAL DATA:  Dyspnea, history of CHF,  cardiac arrest, anoxic encephalopathy EXAM: CHEST 1 VIEW COMPARISON:  Portable chest x-ray of September 13, 2016 FINDINGS: The lungs are reasonably well inflated. Confluent alveolar opacities have worsened bilaterally with dense confluence noted along the lateral aspect of the left hemithorax. The cardiac silhouette remains enlarged and its margins indistinct. The pulmonary vascularity remains engorged. The endotracheal tube tip lies approximately 3 cm above the carina. The right internal jugular venous catheter tip projects at the cavoatrial junction. The esophagogastric tube tip cannot be clearly discerned. IMPRESSION: Slight interval worsening in the appearance of the lungs consistent with pulmonary edema and/or pneumonia. The visualized support tubes are in reasonable position. The esophagogastric tube tip is not demonstrated. Electronically Signed   By: David  [place] M.D.   On: 09/14/2016 07:00   Dg Chest 1 View  Result Date: 09/13/2016 CLINICAL DATA:  Routine am cxr for Anoxic-ischemic encephalopathy EXAM: CHEST 1 VIEW COMPARISON:  09/12/2016 FINDINGS: Right IJ central line tip overlies the level of superior vena cava. Endotracheal tube is in place, 5.6 cm above the carina. Airspace filling opacity within the lungs bilaterally, increased on the left since the prior study. The heart is enlarged. IMPRESSION: Persistent bilateral airspace filling opacities, increased on the left. Electronically Signed   By: Norva Pavlov M.D.   On: 09/13/2016 08:59   Dg Chest 1 View  Result Date: 09/12/2016 CLINICAL DATA:  Dyspnea EXAM: CHEST 1 VIEW COMPARISON:  09/11/2016 FINDINGS: Endotracheal tube remains in good position. Right jugular central venous catheter tip in the right atrium unchanged. Cardiac enlargement. Extensive diffuse bilateral airspace disease unchanged. Negative for pleural effusion IMPRESSION: Diffuse bilateral  airspace disease unchanged. Endotracheal tube remains in good position. Electronically Signed    By: Marlan Palau M.D.   On: 09/12/2016 07:18   Dg Chest 1 View  Result Date: 09/11/2016 CLINICAL DATA:  Dyspnea, recent cardiac arrest, anoxic ischemic encephalopathy, end-stage CHF. EXAM: CHEST 1 VIEW COMPARISON:  Portable chest x-ray of September 09, 2016 FINDINGS: The lungs are mildly hypoinflated. The cardiac silhouette remains enlarged. The pulmonary vascularity is mildly engorged but less conspicuous today. The interstitial markings remain increased. The endotracheal tube tip lies 2.9 cm above the carina. The esophagogastric tube tip projects below the inferior margin of the image. The right internal jugular venous catheter tip projects at the cavoatrial junction or in the right atrium. A left internal jugular venous catheter has its tip terminating at the junction of the left internal jugular vein with the left subclavian vein. IMPRESSION: CHF with mild interstitial edema. Bilateral hypoinflation. No pneumonia nor pleural effusion. The support tubes are in reasonable position. Electronically Signed   By: David  [place] M.D.   On: 09/11/2016 07:13   Dg Chest 1 View  Result Date: 09/09/2016 CLINICAL DATA:  Dyspnea EXAM: CHEST 1 VIEW COMPARISON:  09/08/2016 FINDINGS: Endotracheal tube and nasogastric catheter are noted in satisfactory position. Left jugular central line is noted although the tip does not appear to extend into the innominate vein. A right jugular central line is noted extending into the right atrium. Mild vascular congestion is seen with mild interstitial edema. No focal infiltrate is seen no pneumothorax is noted. IMPRESSION: Stable changes in the lungs. New right jugular central line is seen without pneumothorax. Electronically Signed   By: Alcide Clever M.D.   On: 09/09/2016 12:09   Dg Chest 1 View  Addendum Date: 09/08/2016   ADDENDUM REPORT: 09/08/2016 17:27 ADDENDUM: After reviewing the chest x-ray and chest CT with Dr. Bradly Chris, it appears the patient had central line placed after  the 2016-09-22 CT and radiopaque structure arising from the posterior aspect the brachiocephalic vein is reflux of contrast into the superior intercostal vessel. This was discussed with the patient's nurse Adron Bene. Electronically Signed   By: Lacy Duverney M.D.   On: 09/08/2016 17:27   Result Date: 09/08/2016 CLINICAL DATA:  51 year old male with dyspnea. Subsequent encounter. EXAM: CHEST 1 VIEW COMPARISON:  Sep 22, 2016 FINDINGS: Left central line in the atypical position. On recent chest CT, this appears outside the left brachycephalic vein coursing over the aortic arch. This needs to be removed. Endotracheal tube tip 3.2 cm above the carina. Cardiomegaly. Suspect mild pulmonary edema without significant change. IMPRESSION: Left central line in the atypical position. On recent chest CT, this appears outside the left brachycephalic vein coursing over the aortic arch. This needs to be removed. Cardiomegaly. Suspect mild pulmonary edema without significant change. These results were called by telephone at the time of interpretation on 09/08/2016 at 7:04 am to Adron Bene, RN who verbally acknowledged these results. Electronically Signed: By: Lacy Duverney M.D. On: 09/08/2016 07:12   Ct Head Wo Contrast  Result Date: 09/09/2016 CLINICAL DATA:  Anoxic encephalopathy. EXAM: CT HEAD WITHOUT CONTRAST TECHNIQUE: Contiguous axial images were obtained from the base of the skull through the vertex without intravenous contrast. COMPARISON:  CT scan of 09/22/16. FINDINGS: Brain: No midline shift is noted. There is loss of gray-white matter differentiation as well as sulcal effacement involving the cerebral cortices bilaterally consistent with diffuse cerebral edema, most likely due to anoxic injury. No hemorrhage is noted. Vascular: No  hyperdense vessel or unexpected calcification. Skull: Normal. Negative for fracture or focal lesion. Sinuses/Orbits: Multiple small bilateral maxillary mucous retention cysts are  noted. Other: None. IMPRESSION: There has been interval loss of gray-white matter differentiation as well as sulcal effacement involving the cerebral cortices bilaterally consistent with diffuse cerebral edema, most likely due to anoxic injury. Critical Value/emergent results were called by telephone at the time of interpretation on 09/09/2016 at 1:45 pm to [place], the patient's nurse, who verbally acknowledged these results and will contact the ordering physician. Electronically Signed   By: Lupita Raider, M.D.   On: 09/09/2016 13:46   Ct Head Wo Contrast  Result Date: 08/29/2016 CLINICAL DATA:  Cardiac arrest. EXAM: CT HEAD WITHOUT CONTRAST TECHNIQUE: Contiguous axial images were obtained from the base of the skull through the vertex without intravenous contrast. COMPARISON:  None. FINDINGS: Brain: No evidence of acute infarction, hemorrhage, hydrocephalus, extra-axial collection or mass lesion/mass effect. Vascular: No hyperdense vessel or unexpected calcification. Skull: Normal. Negative for fracture or focal lesion. Sinuses/Orbits: Scattered areas of mucosal disease in the maxillary sinuses. Minimal sinus disease in the sphenoid sinuses. Other: None. IMPRESSION: No acute intracranial abnormality. Paranasal sinus disease. Electronically Signed   By: Richarda Overlie M.D.   On: 09/11/2016 09:43   Ct Angio Chest Pe W/cm &/or Wo Cm  Result Date: 08/29/2016 CLINICAL DATA:  Cardiac arrest. EXAM: CT ANGIOGRAPHY CHEST WITH CONTRAST TECHNIQUE: Multidetector CT imaging of the chest was performed using the standard protocol during bolus administration of intravenous contrast. Multiplanar CT image reconstructions and MIPs were obtained to evaluate the vascular anatomy. CONTRAST:  75 cc of Isovue 370 COMPARISON:  None FINDINGS: Cardiovascular: The heart size is enlarged. There does not appear to be evidence of right heart strain. No definite evidence for embolus within the main pulmonary artery or the lobar  branches. Examination is nondiagnostic for the segmental, and subsegmental branches. Mediastinum/Nodes: ET tube tip is above the carina. The nasogastric tube with tip in the stomach. Study is very difficult to determine whether not there are any enlarged mediastinal or hilar lymph nodes. No axillary or supraclavicular adenopathy. Lungs/Pleura: No pleural effusion. Bilateral upper and lower lobe posterior predominant atelectasis and subpleural consolidation. Upper Abdomen: No acute abnormality. Musculoskeletal: There is a fracture through the mid body of the sternum, image 54 of series 9. Fracture involving the posterior aspect of the left third rib noted. Review of the MIP images confirms the above findings. IMPRESSION: 1. Markedly diminished exam detail secondary to patient's body habitus. Patient reportedly weighs greater than 500 pounds. 2. No evidence for pulmonary embolus in the main pulmonary artery or the lobar branches. The exam is nondiagnostic for segmental and subsegmental branches. 3. Sternal fracture.  Left posterior third rib fracture. 4. Bilateral posterior atelectasis and subpleural consolidation. Electronically Signed   By: Signa Kell M.D.   On: 09/18/2016 09:57   Dg Chest Port 1 View  Result Date: 09-27-2016 CLINICAL DATA:  Lung abnormality. EXAM: PORTABLE CHEST 1 VIEW COMPARISON:  Yesterday at 0450 hour FINDINGS: Endotracheal tube 5.7 cm from the carina. Enteric tube is present but not well visualized. Right internal jugular central venous catheter tip at the atrial caval junction. Worsening right lung aeration, likely combination of pleural effusion and airspace disease/edema. Left perihilar opacities are grossly similar. Cardiomegaly is stable. No pneumothorax. IMPRESSION: 1. Worsening right lung aeration likely combination of airspace disease/edema and pleural fluid. 2. Stable cardiomegaly and left lung opacities. 3. Stable support apparatus. Electronically Signed  By: Rubye OaksMelanie  Ehinger  M.D.   On: 01-24-2017 01:31   Dg Chest Port 1 View  Result Date: 09/18/2016 CLINICAL DATA:  Respiratory failure. EXAM: PORTABLE CHEST 1 VIEW COMPARISON:  09/17/2016 . FINDINGS: Endotracheal tube, NG tube, right IJ sheath, left PICC line in stable position. Cardiomegaly with diffuse dense bilateral pulmonary alveolar infiltrates/edema. No interim change. Small pleural effusions cannot be excluded. No pneumothorax . IMPRESSION: 1. Lines and tubes in stable position. 2. Cardiomegaly with diffuse dense bilateral pulmonary alveolar infiltrates/edema. No interim change. Small bilateral pleural effusions cannot be excluded . Electronically Signed   By: Maisie Fushomas  Register   On: 09/18/2016 06:21   Dg Chest Port 1 View  Result Date: 09/17/2016 CLINICAL DATA:  Central line placement. EXAM: PORTABLE CHEST 1 VIEW COMPARISON:  Chest radiograph September 16, 2016 FINDINGS: Habitus limited examination. Endotracheal tube tip projects approximately 5 cm above the carina. Dialysis catheter via RIGHT internal jugular venous approach projecting at proximal atrium. LEFT PICC distal tip at approximate brachiocephalic confluence. Nasogastric tube in place, and not well visualized distal to the mid esophageal region. Cardiomegaly and pulmonary vascular congestion, interstitial prominence and slightly decreased alveolar airspace opacities. IMPRESSION: Habitus limited examination. No apparent change in life-support lines. Cardiomegaly and slightly decreased pulmonary edema. Electronically Signed   By: Awilda Metroourtnay  Bloomer M.D.   On: 09/17/2016 20:25   Dg Chest Port 1 View  Result Date: 09/16/2016 CLINICAL DATA:  Respiratory failure. EXAM: PORTABLE CHEST 1 VIEW COMPARISON:  09/14/2016 FINDINGS: Image quality is degraded by patient body habitus. Endotracheal tube terminates at the level of the clavicles, 6.5 cm above the carina. Enteric tube courses towards the upper abdomen with tip not imaged. Right jugular catheter terminates over the high  right atrium. The cardiac silhouette is largely obscured. Diffuse bilateral airspace opacities have mildly worsened, most notably in the right lung base. Multiple areas of dense consolidation are again seen with some air bronchograms. No pneumothorax is identified. IMPRESSION: Mild worsening of diffuse bilateral airspace opacities which may reflect pneumonia or edema. Electronically Signed   By: Sebastian AcheAllen  Grady M.D.   On: 09/16/2016 07:30   Dg Chest Port 1 View  Result Date: 09/13/2016 CLINICAL DATA:  Acute respiratory failure. EXAM: PORTABLE CHEST 1 VIEW COMPARISON:  09/13/16 FINDINGS: The ET tube tip is above the carina. There is a right IJ catheter identified. The tip of which cannot be confidently identified. The heart size is enlarged. Large left pleural effusion is again noted and appears unchanged. There is significantly diminished aeration to the left lung, unchanged from previous exam. Diffuse right lung opacities are also unchanged. IMPRESSION: 1. No change in aeration a lungs compared with previous exam. 2. Cannot confirm location of tip of right IJ catheter. Electronically Signed   By: Signa Kellaylor  Stroud M.D.   On: 09/13/2016 07:49   Dg Chest Port 1 View  Addendum Date: 09/12/2016   ADDENDUM REPORT: 09/12/2016 02:43 ADDENDUM: The patient's left PICC is noted ending about the distal SVC. The right IJ line is grossly unchanged in appearance, though not fully characterized. These results were called by telephone at the time of interpretation on 09/12/2016 at 2:42 am to [facility], who verbally acknowledged these results. Electronically Signed   By: Roanna RaiderJeffery  Chang M.D.   On: 09/12/2016 02:43   Result Date: 09/12/2016 CLINICAL DATA:  Line placement. EXAM: PORTABLE CHEST 1 VIEW COMPARISON:  09/11/2016 FINDINGS: Right internal jugular approach central venous catheter tip is obscured by overlying soft  tissues. Enteric catheter collimated off the image. Endotracheal tube in stable  position. Left internal jugular approach venous catheter terminates at the expected location of the junction of the left internal jugular vein and left subclavian vein. Enlarged cardiac silhouette.  Mediastinal contours appear intact. Low lung volumes with interstitial pulmonary edema. No evidence of pneumothorax. Osseous structures are without acute abnormality. Soft tissues are grossly normal. IMPRESSION: Right internal jugular approach central venous catheter tip not visualized due to overlying soft tissues. The remaining of the supporting lines and tubes in stable position. Enlarged cardiac silhouette and pulmonary edema. Low lung volumes. Electronically Signed: By: Ted Mcalpine M.D. On: 09/11/2016 19:24   Dg Chest Port 1 View  Result Date: 09/11/2016 CLINICAL DATA:  Left PICC placement.  Initial encounter. EXAM: PORTABLE CHEST 1 VIEW COMPARISON:  Chest radiograph performed earlier today at 7:02 p.m. FINDINGS: The patient's left PICC is noted ending about the distal SVC. The endotracheal tube is seen ending 4-5 cm above the carina. A right IJ line is also noted ending about the distal SVC. The lungs are hypoexpanded. Vascular congestion is noted. Bilateral central airspace opacification raises concern for pulmonary edema. No definite pleural effusion or pneumothorax is seen. The cardiomediastinal silhouette is mildly enlarged. No acute osseous abnormalities are seen. IMPRESSION: 1. Left PICC noted ending about the distal SVC. 2. Endotracheal tube seen ending 4-5 cm above the carina. 3. Lungs hypoexpanded. Vascular congestion and mild cardiomegaly. Bilateral central airspace opacification raises concern for pulmonary edema. Electronically Signed   By: Roanna Raider M.D.   On: 09/11/2016 22:08   Dg Chest Port 1 View  Result Date: 09/08/2016 CLINICAL DATA:  Central line placement EXAM: PORTABLE CHEST 1 VIEW COMPARISON:  09/08/2016 FINDINGS: Endotracheal tube with the tip 5.6 cm above the carina.  Nasogastric tube coursing below the diaphragm. Left central venous catheter with the tip projecting over the proximal jugular vein, but is not well delineated secondary to technique. Mild bilateral diffuse interstitial thickening. No pleural effusion or pneumothorax. Stable cardiomegaly. IMPRESSION: 1. Endotracheal tube with the tip 5.6 cm above the carina. 2. Left central venous catheter with the tip projecting over the proximal jugular vein, but is not well delineated secondary to technique. 3. Cardiomegaly with mild pulmonary vascular congestion. Electronically Signed   By: Elige Ko   On: 09/08/2016 09:50   Dg Chest Port 1 View  Result Date: 12-Sep-2016 CLINICAL DATA:  Line placement after cardiac arrest today. EXAM: PORTABLE CHEST 1 VIEW COMPARISON:  CT chest and chest radiograph 09/12/2016. FINDINGS: Endotracheal tube terminates 4.6 cm above the carina. Left IJ central line tip is poorly visualized due to body habitus. It is followed into the left brachiocephalic region but is difficult to follow any further. Nasogastric tube is followed into the distal esophagus but again, is difficult to follow further due to body habitus. Defibrillator pad is seen over the left chest. Heart is enlarged. Lungs are somewhat low in volume with mild interstitial prominence and indistinctness. No definite pleural fluid. No pneumothorax. IMPRESSION: 1. Body habitus is limiting and degrades image quality. 2. Left IJ central line tip is followed into the left brachiocephalic vein but is difficult to see beyond that due to body habitus. 3. Suspect mild pulmonary edema. Electronically Signed   By: Leanna Battles M.D.   On: 12-Sep-2016 11:31   Dg Chest Port 1 View  Result Date: 12-Sep-2016 CLINICAL DATA:  Hypoxia.  Status post cardiac arrest EXAM: PORTABLE CHEST 1 VIEW COMPARISON:  None. FINDINGS:  Endotracheal tube tip is 3.5 cm above the carina. Nasogastric to tip and side port are below the diaphragm. No pneumothorax. There  is cardiomegaly with pulmonary venous hypertension. There is slight interstitial edema. There is no appreciable airspace consolidation. No adenopathy evident. No bone lesions. IMPRESSION: Tube positions as described without pneumothorax. Evidence a degree of congestive heart failure without consolidation or volume loss. Electronically Signed   By: Bretta Bang III M.D.   On: 09/23/2016 08:33    Microbiology Recent Results (from the past 240 hour(s))  Culture, respiratory (NON-Expectorated)     Status: None   Collection Time: 09/14/16 12:21 AM  Result Value Ref Range Status   Specimen Description TRACHEAL ASPIRATE  Final   Special Requests NONE  Final   Gram Stain   Final    ABUNDANT WBC PRESENT, PREDOMINANTLY PMN NO SQUAMOUS EPITHELIAL CELLS SEEN NO ORGANISMS SEEN    Culture   Final    NO GROWTH Performed at [facility], [address] [place], [place] [address]    Report Status 09/16/2016 FINAL  Final  CULTURE, BLOOD (ROUTINE X 2) w Reflex to ID Panel     Status: None (Preliminary result)   Collection Time: 09/18/16  8:52 AM  Result Value Ref Range Status   Specimen Description BLOOD RIGHT ANTECUBITAL  Final   Special Requests PEDIATRICS Blood Culture adequate volume  Final   Culture NO GROWTH < 24 HOURS  Final   Report Status PENDING  Incomplete  Culture, respiratory (NON-Expectorated)     Status: None (Preliminary result)   Collection Time: 09/18/16 11:36 AM  Result Value Ref Range Status   Specimen Description TRACHEAL ASPIRATE  Final   Special Requests NONE  Final   Gram Stain NO WBC SEEN NO ORGANISMS SEEN   Final   Culture   Final    CULTURE REINCUBATED FOR BETTER GROWTH Performed at [facility], [address] [place], [place] [address]    Report Status PENDING  Incomplete  Culture, blood (Routine X 2) w Reflex to ID Panel     Status: None (Preliminary result)   Collection Time: 09/18/16  5:39 PM  Result Value Ref Range Status   Specimen  Description BLOOD CENTRAL LINE  Final   Special Requests   Final    BOTTLES DRAWN AEROBIC AND ANAEROBIC Blood Culture adequate volume   Culture NO GROWTH < 12 HOURS  Final   Report Status PENDING  Incomplete    Lab Basic Metabolic Panel:  Recent Labs Lab 09/17/16 2101 09/18/16 0505 09/18/16 1304 09/18/16 2117 2016-10-18 0425  NA 135 136 135 135 140  K 3.7 3.9 3.9 4.1 4.1  CL 106 105 104 104 104  CO2 24 25 25 25 27   GLUCOSE 101* 127* 130* 107* 145*  BUN 35* 33* 36* 36* 35*  CREATININE 2.16* 2.14* 2.29* 2.61* 2.59*  CALCIUM 8.2* 8.4* 8.5* 8.3* 8.2*  MG 1.8 1.8 1.8 1.8 1.8  PHOS 2.2* 2.4* 2.3* 2.7 3.3   Liver Function Tests:  Recent Labs Lab 09/14/16 0513  09/16/16 0419  09/17/16 0443  09/17/16 2101 09/18/16 0505 09/18/16 1304 09/18/16 2117 2016-10-18 0425  AST 29  --  26  --  30  --   --   --   --   --   --   ALT 10*  --  11*  --  13*  --   --   --   --   --   --  ALKPHOS 73  --  69  --  69  --   --   --   --   --   --   BILITOT 1.9*  --  2.7*  --  3.2*  --   --   --   --   --   --   PROT 6.2*  --  6.3*  --  6.4*  --   --   --   --   --   --   ALBUMIN 2.0*  < > 2.3*  2.4*  < > 2.4*  2.5*  < > 2.2* 2.4* 2.3* 2.3* 2.3*  < > = values in this interval not displayed. No results for input(s): LIPASE, AMYLASE in the last 168 hours. No results for input(s): AMMONIA in the last 168 hours. CBC:  Recent Labs Lab 09/15/16 0420 09/16/16 0419 09/17/16 0443 09/18/16 0505 08/28/2016 0425  WBC 14.8* 16.0* 17.0* 22.5* 42.8*  HGB 10.0* 10.0* 9.9* 9.7* 8.9*  HCT 30.8* 30.9* 30.5* 29.7* 28.0*  MCV 80.8 79.0* 79.9* 81.6 81.8  PLT 110* 106* 102* 115* 138*   Cardiac Enzymes:  Recent Labs Lab 09/13/16 0811 09/13/16 1237 09/13/16 1815  TROPONINI 0.05* 0.06* 0.05*   Sepsis Labs:  Recent Labs Lab 09/16/16 0419 09/17/16 0443 09/18/16 0505 09/26/2016 0425  WBC 16.0* 17.0* 22.5* 42.8*    Procedures/Operations  HD catheter placement PICC line ETT NGT  Kimiah Hibner S.  [facility] ANP-BC Pulmonary and Critical Care Medicine [facility] Pager [phone] or [phone]  09/11/2016, 11:34 PM

## 2016-09-27 NOTE — Progress Notes (Signed)
Pharmacy Consult for Heparin Indication: atrial fibrillation/ dialysis catheter clotting  No Known Allergies  Patient Measurements: Height: 6' (182.9 cm) Weight: (!) 494 lb (224.1 kg) IBW/kg (Calculated) : 77.6 Heparin Dosing Weight: 134.6 kg  Vital Signs: Temp: 100.4 F (38 C) (06/23 1000) Temp Source: Core (Comment) (06/23 0800) BP: 98/82 (06/23 1000) Pulse Rate: 149 (06/23 1000)  Labs:  Recent Labs  09/17/16 0443  09/18/16 0505 09/18/16 1304 09/18/16 1759 09/18/16 2117 09/09/2016 0228 09/23/2016 0425 09/16/2016 0952  HGB 9.9*  --  9.7*  --   --   --   --  8.9*  --   HCT 30.5*  --  29.7*  --   --   --   --  28.0*  --   PLT 102*  --  115*  --   --   --   --  138*  --   APTT  --   --   --  42*  --   --   --   --   --   LABPROT  --   --   --  14.8  --   --   --   --   --   INR  --   --   --  1.15  --   --   --   --   --   HEPARINUNFRC  --   --   --   --  0.10*  --  0.33  --  0.21*  CREATININE 1.92*  2.00*  < > 2.14* 2.29*  --  2.61*  --  2.59*  --   < > = values in this interval not displayed.  Estimated Creatinine Clearance: 67.2 mL/min (A) (by C-G formula based on SCr of 2.59 mg/dL (H)).   Medical History: Past Medical History:  Diagnosis Date  . CHF (congestive heart failure) (HCC)   . Diabetes mellitus without complication (HCC)   . Hypertension    Assessment: 51 y/o M s/p cardiac arrest and code ICE with a h/o atrial fibrillation and issues with dialysis catheter clotting. Will initiate low dose heparin drip with lowered anti-Xa goal per nephrology.   Goal of Therapy:  Heparin Level 0.3-0.5 Monitor platelets by anticoagulation protocol: Yes   Plan:  Will increase heparin infusion to 1550 units/hr and recheck a HL in 6 hours.   Luisa HartScott Gracilyn Gunia, PharmD Clinical Pharmacist  09/17/2016

## 2016-09-27 DEATH — deceased

## 2018-07-21 IMAGING — DX DG CHEST 1V PORT
1 series · 1 of 1 positions shown · non-contrast
Comparison: None.

CLINICAL DATA: Hypoxia.  Status post cardiac arrest

EXAM:
PORTABLE CHEST 1 VIEW

[chest ap]
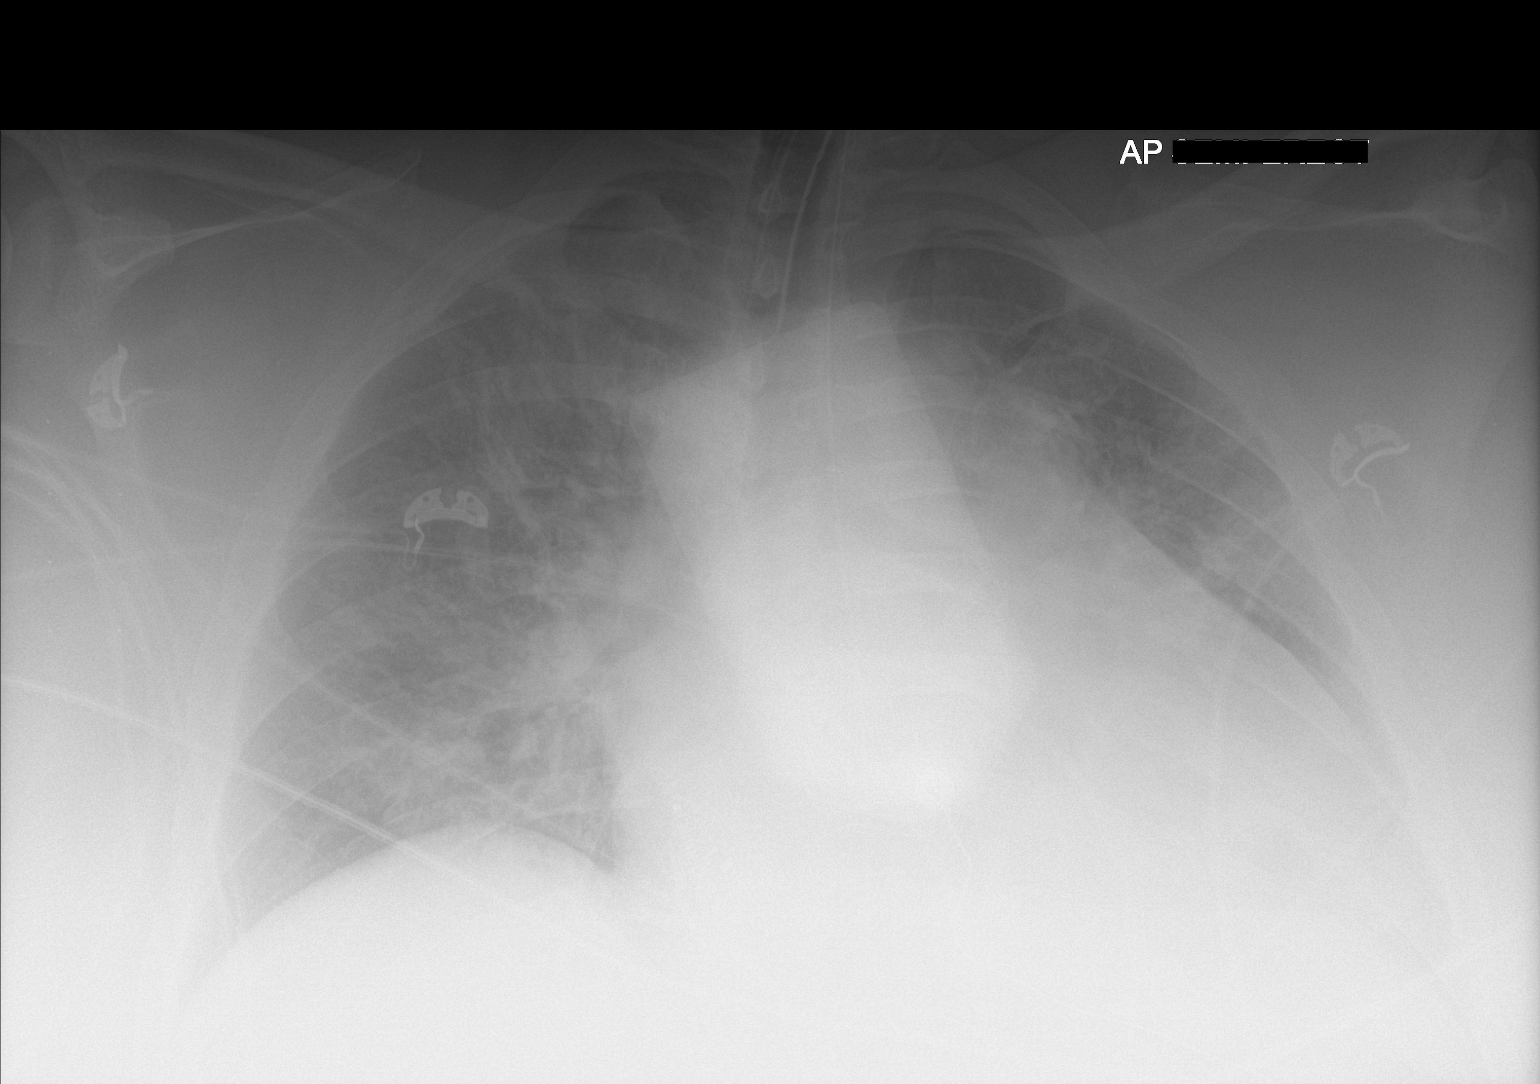

[1 of 1 positions shown; findings below may reference images not displayed]

FINDINGS: Endotracheal tube tip is 3.5 cm above the carina. Nasogastric to tip
and side port are below the diaphragm. No pneumothorax. There is
cardiomegaly with pulmonary venous hypertension. There is slight
interstitial edema. There is no appreciable airspace consolidation.
No adenopathy evident. No bone lesions.
IMPRESSION: Tube positions as described without pneumothorax. Evidence a degree
of congestive heart failure without consolidation or volume loss.

## 2018-07-21 IMAGING — CT CT ANGIO CHEST
2 of 6 series · 18 of 46 positions shown · IV contrast (APPLIED)
Comparison: None

CLINICAL DATA: Cardiac arrest.

EXAM:
CT ANGIOGRAPHY CHEST WITH CONTRAST
TECHNIQUE: Multidetector CT imaging of the chest was performed using the
standard protocol during bolus administration of intravenous
contrast. Multiplanar CT image reconstructions and MIPs were
obtained to evaluate the vascular anatomy.
CONTRAST:  75 cc of Isovue 370

[Series 6: thins · axial · 0.73mm/px · z∈[-416,-212]mm · 16 of 224 slices shown]
[im 10/224  lung]
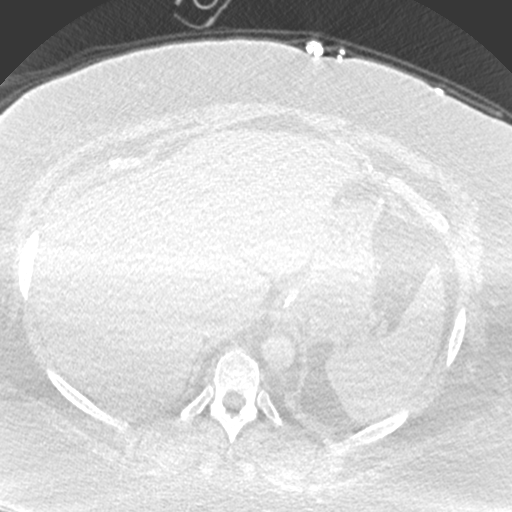
[im 30/224  soft-tissue]
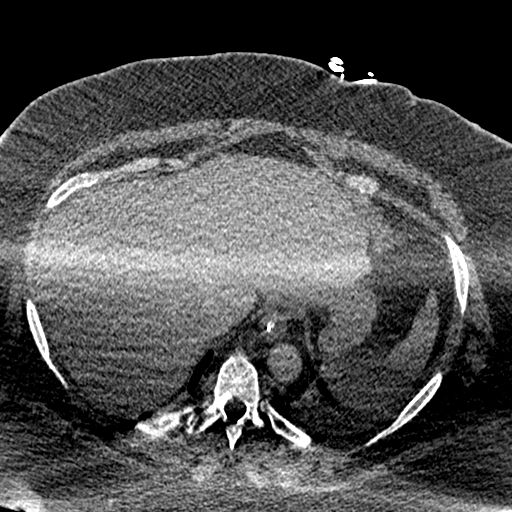
[im 39/224  lung]
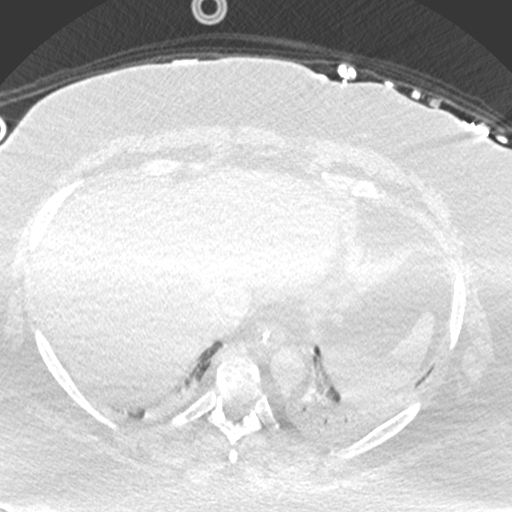
[im 49/224  soft-tissue]
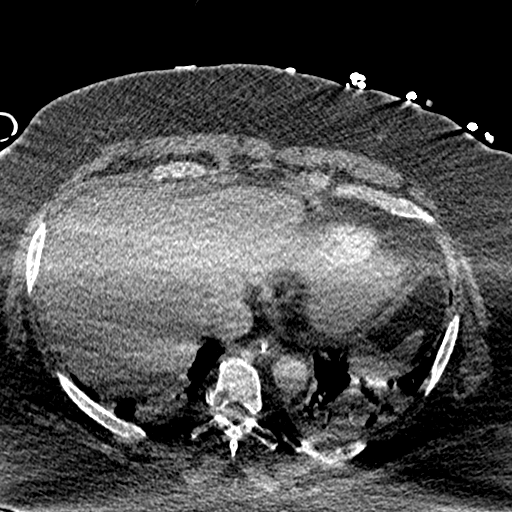
[im 68/224  lung]
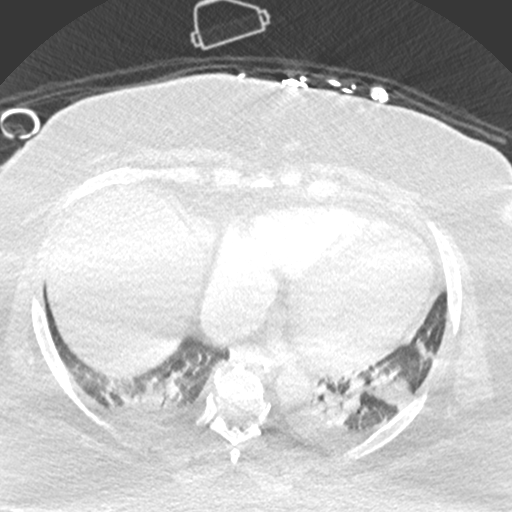
[im 78/224  soft-tissue]
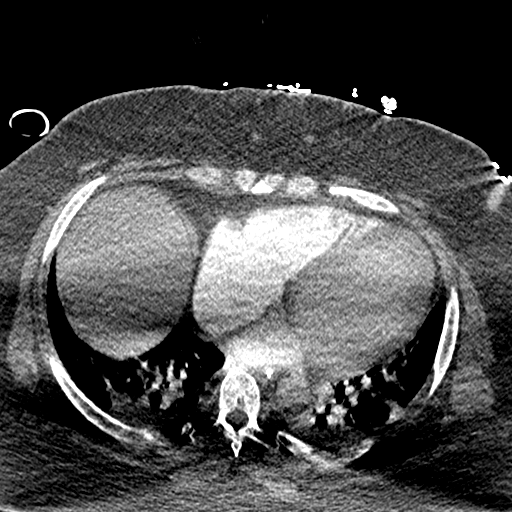
[im 88/224  lung]
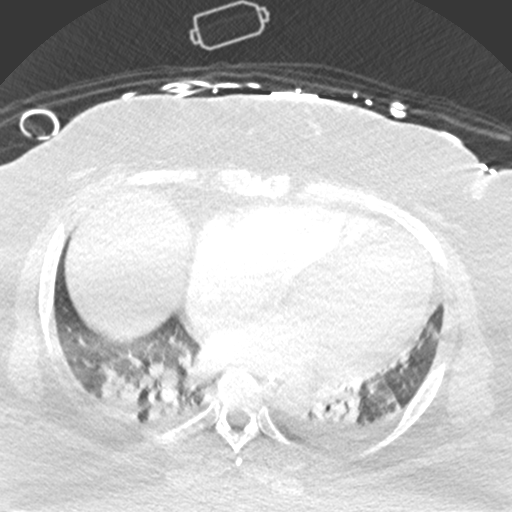
[im 107/224  soft-tissue]
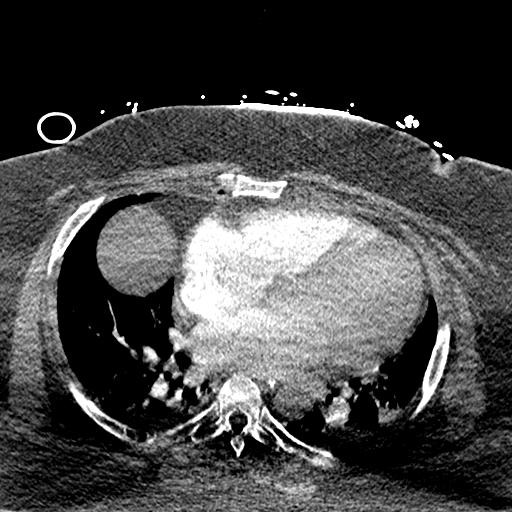
[im 117/224  lung]
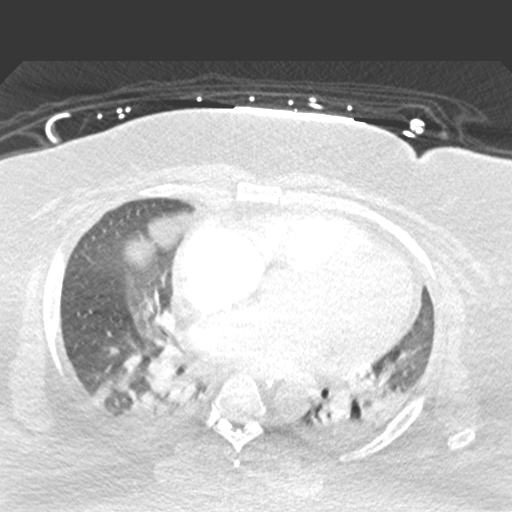
[im 136/224  soft-tissue]
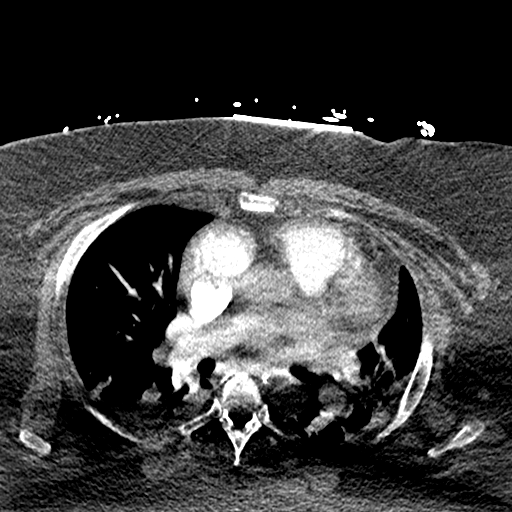
[im 146/224  lung]
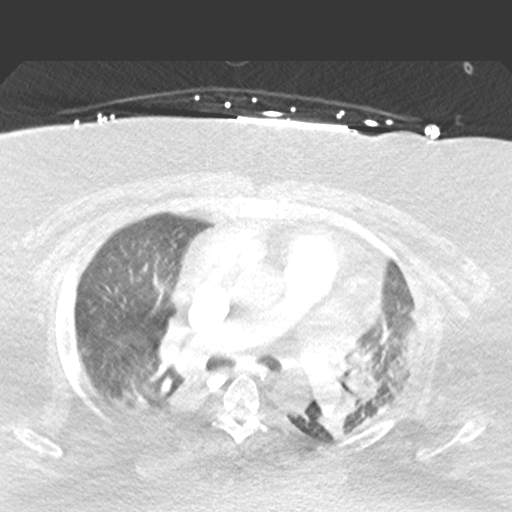
[im 156/224  soft-tissue]
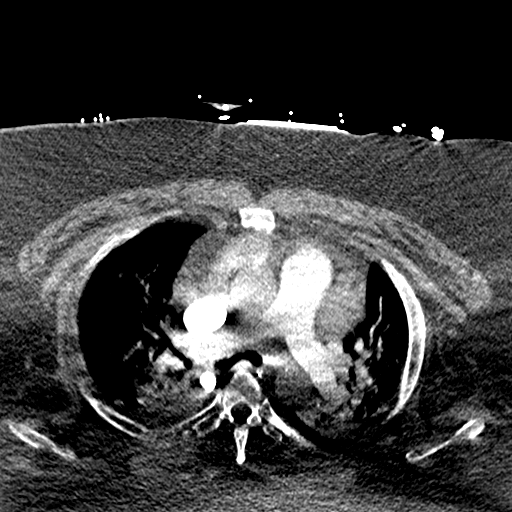
[im 175/224  lung]
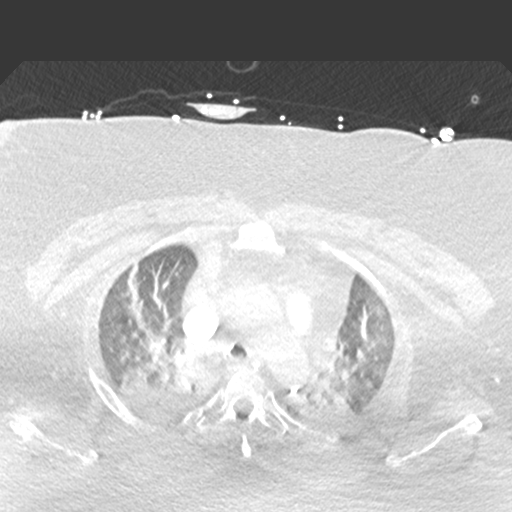
[im 185/224  soft-tissue]
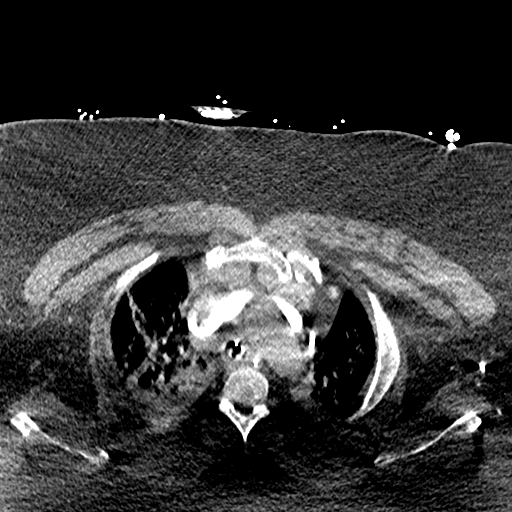
[im 194/224  lung]
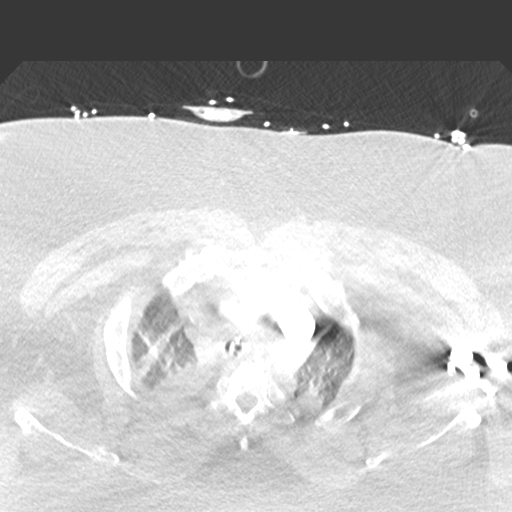
[im 214/224  soft-tissue]
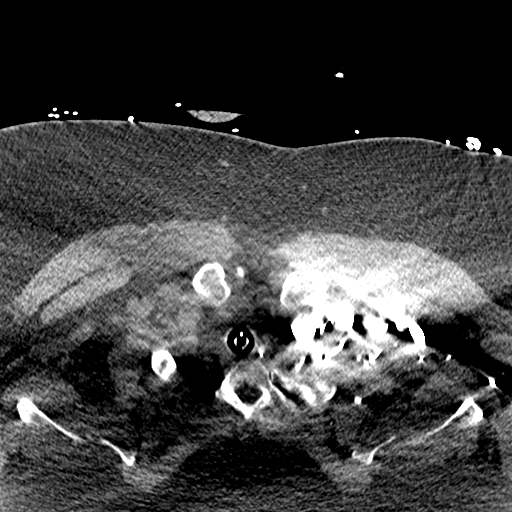

[Series 8: coronal mpr · coronal · 0.49mm/px · 2 of 109 slices shown]
[im 37/109  soft-tissue]
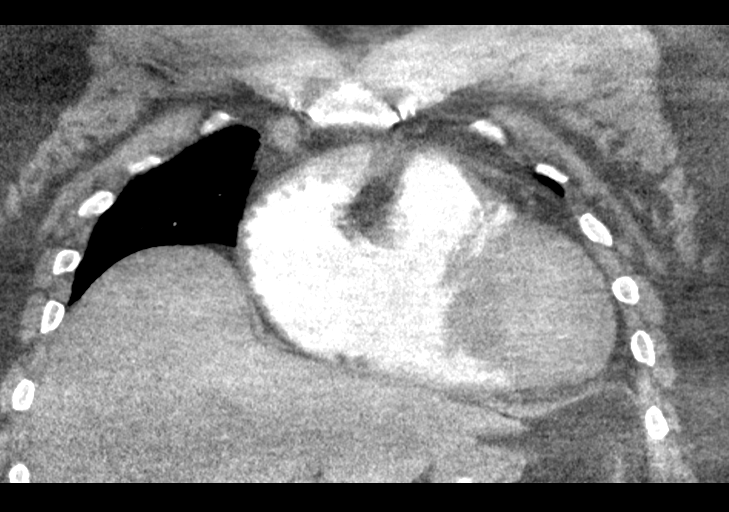
[im 73/109  soft-tissue]
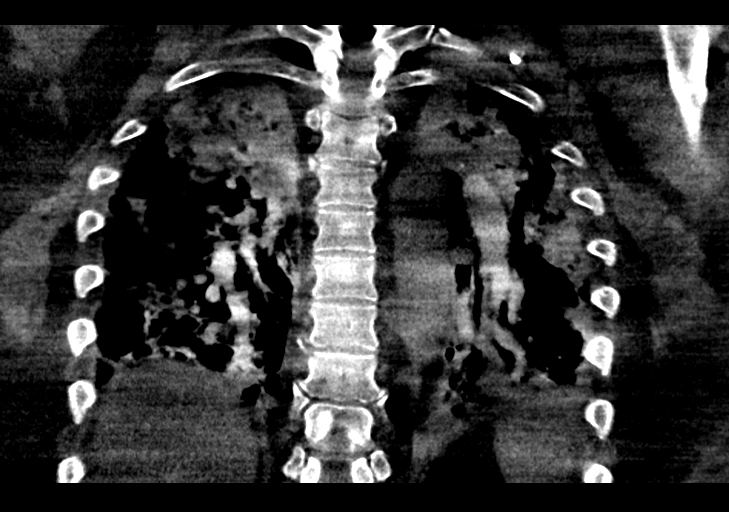

[18 of 46 positions shown; findings below may reference images not displayed]

FINDINGS: Cardiovascular: The heart size is enlarged. There does not appear to
be evidence of right heart strain. No definite evidence for embolus
within the main pulmonary artery or the lobar branches. Examination
is nondiagnostic for the segmental, and subsegmental branches.

Mediastinum/Nodes: ET tube tip is above the carina. The nasogastric
tube with tip in the stomach. Study is very difficult to determine
whether not there are any enlarged mediastinal or hilar lymph nodes.
No axillary or supraclavicular adenopathy.

Lungs/Pleura: No pleural effusion. Bilateral upper and lower lobe
posterior predominant atelectasis and subpleural consolidation.

Upper Abdomen: No acute abnormality.

Musculoskeletal: There is a fracture through the mid body of the
sternum, image 54 of series 9. Fracture involving the posterior
aspect of the left third rib noted.

Review of the MIP images confirms the above findings.
IMPRESSION: 1. Markedly diminished exam detail secondary to patient's body
habitus. Patient reportedly weighs greater than 500 pounds.
2. No evidence for pulmonary embolus in the main pulmonary artery or
the lobar branches. The exam is nondiagnostic for segmental and
subsegmental branches.
3. Sternal fracture.  Left posterior third rib fracture.
4. Bilateral posterior atelectasis and subpleural consolidation.
